# Patient Record
Sex: Male | Born: 1935 | Race: White | Hispanic: No | Marital: Married | Smoking: Never smoker
Health system: Southern US, Community
[De-identification: ages and names within clinical notes are randomized; demographics above are authoritative.]

## PROBLEM LIST (undated history)

## (undated) DIAGNOSIS — G4730 Sleep apnea, unspecified: Secondary | ICD-10-CM

## (undated) DIAGNOSIS — G4700 Insomnia, unspecified: Secondary | ICD-10-CM

## (undated) DIAGNOSIS — F0390 Unspecified dementia without behavioral disturbance: Secondary | ICD-10-CM

## (undated) DIAGNOSIS — I10 Essential (primary) hypertension: Secondary | ICD-10-CM

## (undated) DIAGNOSIS — Z95810 Presence of automatic (implantable) cardiac defibrillator: Secondary | ICD-10-CM

## (undated) DIAGNOSIS — F329 Major depressive disorder, single episode, unspecified: Secondary | ICD-10-CM

## (undated) DIAGNOSIS — E559 Vitamin D deficiency, unspecified: Secondary | ICD-10-CM

## (undated) DIAGNOSIS — E669 Obesity, unspecified: Secondary | ICD-10-CM

## (undated) DIAGNOSIS — F32A Depression, unspecified: Secondary | ICD-10-CM

## (undated) DIAGNOSIS — N390 Urinary tract infection, site not specified: Secondary | ICD-10-CM

## (undated) DIAGNOSIS — I447 Left bundle-branch block, unspecified: Secondary | ICD-10-CM

## (undated) DIAGNOSIS — I428 Other cardiomyopathies: Secondary | ICD-10-CM

## (undated) HISTORY — PX: TOTAL KNEE ARTHROPLASTY: SHX125

## (undated) HISTORY — PX: HERNIA REPAIR: SHX51

## (undated) HISTORY — DX: Other cardiomyopathies: I42.8

## (undated) HISTORY — PX: BACK SURGERY: SHX140

## (undated) HISTORY — PX: CARDIAC DEFIBRILLATOR PLACEMENT: SHX171

## (undated) HISTORY — DX: Left bundle-branch block, unspecified: I44.7

---

## 1999-08-02 ENCOUNTER — Encounter: Payer: Self-pay | Admitting: Emergency Medicine

## 1999-08-02 ENCOUNTER — Inpatient Hospital Stay (HOSPITAL_COMMUNITY): Admission: EM | Admit: 1999-08-02 | Discharge: 1999-08-03 | Payer: Self-pay | Admitting: Emergency Medicine

## 1999-08-03 ENCOUNTER — Encounter (HOSPITAL_BASED_OUTPATIENT_CLINIC_OR_DEPARTMENT_OTHER): Payer: Self-pay | Admitting: Internal Medicine

## 1999-08-03 ENCOUNTER — Inpatient Hospital Stay (HOSPITAL_COMMUNITY): Admission: AD | Admit: 1999-08-03 | Discharge: 1999-08-18 | Payer: Self-pay | Admitting: Psychiatry

## 2000-01-18 ENCOUNTER — Inpatient Hospital Stay (HOSPITAL_COMMUNITY): Admission: EM | Admit: 2000-01-18 | Discharge: 2000-01-23 | Payer: Self-pay | Admitting: *Deleted

## 2000-09-19 ENCOUNTER — Inpatient Hospital Stay (HOSPITAL_COMMUNITY): Admission: EM | Admit: 2000-09-19 | Discharge: 2000-09-25 | Payer: Self-pay | Admitting: Psychiatry

## 2001-03-01 ENCOUNTER — Inpatient Hospital Stay (HOSPITAL_COMMUNITY): Admission: EM | Admit: 2001-03-01 | Discharge: 2001-03-05 | Payer: Self-pay | Admitting: *Deleted

## 2001-04-16 ENCOUNTER — Inpatient Hospital Stay (HOSPITAL_COMMUNITY): Admission: EM | Admit: 2001-04-16 | Discharge: 2001-04-21 | Payer: Self-pay | Admitting: *Deleted

## 2001-05-21 ENCOUNTER — Inpatient Hospital Stay (HOSPITAL_COMMUNITY): Admission: EM | Admit: 2001-05-21 | Discharge: 2001-05-29 | Payer: Self-pay | Admitting: *Deleted

## 2001-08-07 ENCOUNTER — Inpatient Hospital Stay (HOSPITAL_COMMUNITY): Admission: EM | Admit: 2001-08-07 | Discharge: 2001-08-18 | Payer: Self-pay | Admitting: Psychiatry

## 2002-08-07 ENCOUNTER — Encounter: Payer: Self-pay | Admitting: Internal Medicine

## 2002-08-07 ENCOUNTER — Encounter: Admission: RE | Admit: 2002-08-07 | Discharge: 2002-08-07 | Payer: Self-pay | Admitting: Internal Medicine

## 2002-08-28 ENCOUNTER — Encounter: Payer: Self-pay | Admitting: Orthopedic Surgery

## 2002-08-31 ENCOUNTER — Encounter: Payer: Self-pay | Admitting: Orthopedic Surgery

## 2002-08-31 ENCOUNTER — Inpatient Hospital Stay (HOSPITAL_COMMUNITY): Admission: RE | Admit: 2002-08-31 | Discharge: 2002-09-09 | Payer: Self-pay | Admitting: Orthopedic Surgery

## 2002-09-02 ENCOUNTER — Encounter: Payer: Self-pay | Admitting: Orthopedic Surgery

## 2003-05-18 ENCOUNTER — Encounter: Payer: Self-pay | Admitting: Neurosurgery

## 2003-05-18 ENCOUNTER — Inpatient Hospital Stay (HOSPITAL_COMMUNITY): Admission: RE | Admit: 2003-05-18 | Discharge: 2003-05-26 | Payer: Self-pay | Admitting: Neurosurgery

## 2004-01-11 ENCOUNTER — Encounter: Admission: RE | Admit: 2004-01-11 | Discharge: 2004-01-11 | Payer: Self-pay | Admitting: Urology

## 2004-01-12 ENCOUNTER — Ambulatory Visit (HOSPITAL_COMMUNITY): Admission: RE | Admit: 2004-01-12 | Discharge: 2004-01-12 | Payer: Self-pay | Admitting: Urology

## 2004-01-12 ENCOUNTER — Ambulatory Visit (HOSPITAL_BASED_OUTPATIENT_CLINIC_OR_DEPARTMENT_OTHER): Admission: RE | Admit: 2004-01-12 | Discharge: 2004-01-12 | Payer: Self-pay | Admitting: Urology

## 2004-03-07 ENCOUNTER — Encounter: Admission: RE | Admit: 2004-03-07 | Discharge: 2004-03-07 | Payer: Self-pay | Admitting: Family Medicine

## 2004-04-04 ENCOUNTER — Ambulatory Visit (HOSPITAL_COMMUNITY): Admission: RE | Admit: 2004-04-04 | Discharge: 2004-04-04 | Payer: Self-pay | Admitting: Cardiology

## 2004-04-12 ENCOUNTER — Encounter: Admission: RE | Admit: 2004-04-12 | Discharge: 2004-04-12 | Payer: Self-pay | Admitting: Orthopedic Surgery

## 2004-04-13 ENCOUNTER — Ambulatory Visit (HOSPITAL_COMMUNITY): Admission: RE | Admit: 2004-04-13 | Discharge: 2004-04-13 | Payer: Self-pay | Admitting: Orthopedic Surgery

## 2004-04-13 ENCOUNTER — Ambulatory Visit (HOSPITAL_BASED_OUTPATIENT_CLINIC_OR_DEPARTMENT_OTHER): Admission: RE | Admit: 2004-04-13 | Discharge: 2004-04-13 | Payer: Self-pay | Admitting: Orthopedic Surgery

## 2004-05-22 IMAGING — NM NM MYOCAR PERF WALL MOTION
3 series · 13 of 13 positions shown · non-contrast
Comparison: none

CLINICAL DATA: Chest pain.  Hypertension. Preop.
 NUCLEAR MEDICINE MYOCARDIAL PERFUSION IMAGING WITH SPECT

[Series 1: sc stress gated cardio · 1 of 1 slices shown (1 of 2)]
[im 1/1]
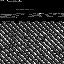

[Series 1: rc rest cardiolite · 6.85mm/px · 6 of 64 frames shown]
[frame 6/64]
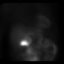
[frame 16/64]
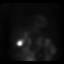
[frame 27/64]
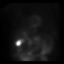
[frame 38/64]
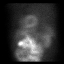
[frame 48/64]
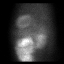
[frame 59/64]
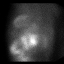

[Series 1: sc stress gated cardio · 6.85mm/px · 6 of 512 frames shown (2 of 2)]
[frame 43/512]
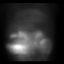
[frame 128/512]
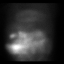
[frame 214/512]
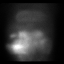
[frame 299/512]
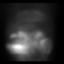
[frame 384/512]
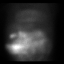
[frame 470/512]
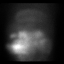

[13 of 13 positions shown; findings below may reference images not displayed]

FINDINGS: After the intravenous injection of 10 and 30 millicuries technetium 99m sestamibi, myocardial perfusion rest and stress imaging was obtained.  Persantine was utilized as the stress agent.
 MYOCARDIAL PERFUSION IMAGING WITH SPECT
 Fixed thinning of the inferolateral wall towards the base and mild fixed thinning of the anteroseptal region towards the apex is noted. A very small perfusion defect on the stress images re-perfuses normally on the rest images worrisome for a very small area of stress-induced ischemia.
 WALL MOTION:  
 Mild global hypokinesis is present. 
 EJECTION FRACTION
 Calculated ejection fraction is 43% with an end diastolic volume of 163 ml and an end systolic volume of 92 ml.
 IMPRESSION
 A tiny area of stress-induced ischemia at the apex.
 Ejection fraction is 43% with mild global hypokinesis.

## 2004-05-23 ENCOUNTER — Ambulatory Visit (HOSPITAL_COMMUNITY): Admission: RE | Admit: 2004-05-23 | Discharge: 2004-05-23 | Payer: Self-pay | Admitting: *Deleted

## 2004-05-23 ENCOUNTER — Encounter (INDEPENDENT_AMBULATORY_CARE_PROVIDER_SITE_OTHER): Payer: Self-pay | Admitting: *Deleted

## 2004-05-24 ENCOUNTER — Ambulatory Visit (HOSPITAL_COMMUNITY): Admission: RE | Admit: 2004-05-24 | Discharge: 2004-05-24 | Payer: Self-pay | Admitting: *Deleted

## 2004-06-09 ENCOUNTER — Ambulatory Visit (HOSPITAL_COMMUNITY): Admission: RE | Admit: 2004-06-09 | Discharge: 2004-06-09 | Payer: Self-pay | Admitting: *Deleted

## 2004-07-07 ENCOUNTER — Observation Stay (HOSPITAL_COMMUNITY): Admission: RE | Admit: 2004-07-07 | Discharge: 2004-07-08 | Payer: Self-pay | Admitting: General Surgery

## 2005-01-09 ENCOUNTER — Ambulatory Visit (HOSPITAL_COMMUNITY): Admission: RE | Admit: 2005-01-09 | Discharge: 2005-01-10 | Payer: Self-pay | Admitting: General Surgery

## 2005-06-14 ENCOUNTER — Ambulatory Visit (HOSPITAL_COMMUNITY): Admission: RE | Admit: 2005-06-14 | Discharge: 2005-06-14 | Payer: Self-pay | Admitting: Internal Medicine

## 2005-08-15 ENCOUNTER — Emergency Department (HOSPITAL_COMMUNITY): Admission: EM | Admit: 2005-08-15 | Discharge: 2005-08-15 | Payer: Self-pay | Admitting: Emergency Medicine

## 2006-04-12 ENCOUNTER — Emergency Department (HOSPITAL_COMMUNITY): Admission: EM | Admit: 2006-04-12 | Discharge: 2006-04-12 | Payer: Self-pay | Admitting: Family Medicine

## 2006-04-13 ENCOUNTER — Emergency Department (HOSPITAL_COMMUNITY): Admission: EM | Admit: 2006-04-13 | Discharge: 2006-04-13 | Payer: Self-pay | Admitting: Emergency Medicine

## 2006-04-17 ENCOUNTER — Inpatient Hospital Stay (HOSPITAL_COMMUNITY): Admission: EM | Admit: 2006-04-17 | Discharge: 2006-04-19 | Payer: Self-pay | Admitting: Emergency Medicine

## 2006-04-18 ENCOUNTER — Encounter (INDEPENDENT_AMBULATORY_CARE_PROVIDER_SITE_OTHER): Payer: Self-pay | Admitting: *Deleted

## 2006-05-01 ENCOUNTER — Emergency Department (HOSPITAL_COMMUNITY): Admission: EM | Admit: 2006-05-01 | Discharge: 2006-05-01 | Payer: Self-pay | Admitting: Podiatry

## 2006-05-13 ENCOUNTER — Encounter: Admission: RE | Admit: 2006-05-13 | Discharge: 2006-05-13 | Payer: Self-pay | Admitting: Cardiovascular Disease

## 2006-05-17 ENCOUNTER — Inpatient Hospital Stay (HOSPITAL_COMMUNITY): Admission: RE | Admit: 2006-05-17 | Discharge: 2006-05-19 | Payer: Self-pay | Admitting: Cardiovascular Disease

## 2006-08-28 ENCOUNTER — Ambulatory Visit: Payer: Self-pay | Admitting: Pulmonary Disease

## 2006-08-28 ENCOUNTER — Inpatient Hospital Stay (HOSPITAL_COMMUNITY): Admission: EM | Admit: 2006-08-28 | Discharge: 2006-09-04 | Payer: Self-pay | Admitting: Emergency Medicine

## 2007-06-03 ENCOUNTER — Inpatient Hospital Stay (HOSPITAL_COMMUNITY): Admission: EM | Admit: 2007-06-03 | Discharge: 2007-06-10 | Payer: Self-pay | Admitting: Emergency Medicine

## 2007-06-09 ENCOUNTER — Encounter (INDEPENDENT_AMBULATORY_CARE_PROVIDER_SITE_OTHER): Payer: Self-pay | Admitting: *Deleted

## 2007-06-10 ENCOUNTER — Encounter (INDEPENDENT_AMBULATORY_CARE_PROVIDER_SITE_OTHER): Payer: Self-pay | Admitting: *Deleted

## 2007-12-04 ENCOUNTER — Ambulatory Visit (HOSPITAL_COMMUNITY): Admission: RE | Admit: 2007-12-04 | Discharge: 2007-12-04 | Payer: Self-pay | Admitting: *Deleted

## 2007-12-04 ENCOUNTER — Encounter (INDEPENDENT_AMBULATORY_CARE_PROVIDER_SITE_OTHER): Payer: Self-pay | Admitting: *Deleted

## 2008-09-04 ENCOUNTER — Inpatient Hospital Stay (HOSPITAL_COMMUNITY): Admission: EM | Admit: 2008-09-04 | Discharge: 2008-09-07 | Payer: Self-pay | Admitting: Emergency Medicine

## 2008-09-08 ENCOUNTER — Emergency Department (HOSPITAL_COMMUNITY): Admission: EM | Admit: 2008-09-08 | Discharge: 2008-09-08 | Payer: Self-pay | Admitting: Emergency Medicine

## 2009-03-16 ENCOUNTER — Inpatient Hospital Stay (HOSPITAL_COMMUNITY): Admission: EM | Admit: 2009-03-16 | Discharge: 2009-03-21 | Payer: Self-pay | Admitting: Emergency Medicine

## 2009-05-25 ENCOUNTER — Inpatient Hospital Stay (HOSPITAL_COMMUNITY): Admission: EM | Admit: 2009-05-25 | Discharge: 2009-05-28 | Payer: Self-pay | Admitting: Emergency Medicine

## 2009-09-02 ENCOUNTER — Ambulatory Visit: Payer: Self-pay | Admitting: Diagnostic Radiology

## 2009-09-02 ENCOUNTER — Emergency Department (HOSPITAL_BASED_OUTPATIENT_CLINIC_OR_DEPARTMENT_OTHER): Admission: EM | Admit: 2009-09-02 | Discharge: 2009-09-02 | Payer: Self-pay | Admitting: Emergency Medicine

## 2009-10-06 ENCOUNTER — Emergency Department (HOSPITAL_COMMUNITY): Admission: EM | Admit: 2009-10-06 | Discharge: 2009-10-06 | Payer: Self-pay | Admitting: Emergency Medicine

## 2009-10-17 ENCOUNTER — Inpatient Hospital Stay (HOSPITAL_COMMUNITY): Admission: EM | Admit: 2009-10-17 | Discharge: 2009-10-28 | Payer: Self-pay | Admitting: Emergency Medicine

## 2010-08-11 ENCOUNTER — Inpatient Hospital Stay (HOSPITAL_COMMUNITY): Admission: EM | Admit: 2010-08-11 | Discharge: 2010-08-15 | Payer: Self-pay | Admitting: Emergency Medicine

## 2010-08-12 ENCOUNTER — Encounter (INDEPENDENT_AMBULATORY_CARE_PROVIDER_SITE_OTHER): Payer: Self-pay | Admitting: Emergency Medicine

## 2010-08-12 ENCOUNTER — Ambulatory Visit: Payer: Self-pay | Admitting: Surgery

## 2010-08-13 ENCOUNTER — Encounter (INDEPENDENT_AMBULATORY_CARE_PROVIDER_SITE_OTHER): Payer: Self-pay | Admitting: Emergency Medicine

## 2010-08-16 ENCOUNTER — Encounter (INDEPENDENT_AMBULATORY_CARE_PROVIDER_SITE_OTHER): Payer: Self-pay | Admitting: *Deleted

## 2010-09-26 ENCOUNTER — Inpatient Hospital Stay (HOSPITAL_COMMUNITY): Admission: EM | Admit: 2010-09-26 | Discharge: 2010-10-06 | Payer: Self-pay | Admitting: Emergency Medicine

## 2010-09-26 ENCOUNTER — Ambulatory Visit: Payer: Self-pay | Admitting: Internal Medicine

## 2010-10-06 ENCOUNTER — Encounter: Payer: Self-pay | Admitting: Internal Medicine

## 2010-10-11 ENCOUNTER — Ambulatory Visit: Payer: Self-pay | Admitting: Internal Medicine

## 2010-10-11 ENCOUNTER — Ambulatory Visit (HOSPITAL_COMMUNITY)
Admission: RE | Admit: 2010-10-11 | Discharge: 2010-10-11 | Payer: Self-pay | Source: Home / Self Care | Admitting: Internal Medicine

## 2010-10-19 ENCOUNTER — Encounter (INDEPENDENT_AMBULATORY_CARE_PROVIDER_SITE_OTHER): Payer: Self-pay | Admitting: *Deleted

## 2010-10-27 ENCOUNTER — Encounter: Payer: Self-pay | Admitting: Internal Medicine

## 2010-11-11 ENCOUNTER — Ambulatory Visit: Payer: Self-pay | Admitting: Psychiatry

## 2010-11-13 IMAGING — CR DG CHEST 2V
1 series · 1 of 1 positions shown · non-contrast
Comparison: 08/11/2010

CLINICAL DATA: Weakness

CHEST - 2 VIEW

[view not recorded]
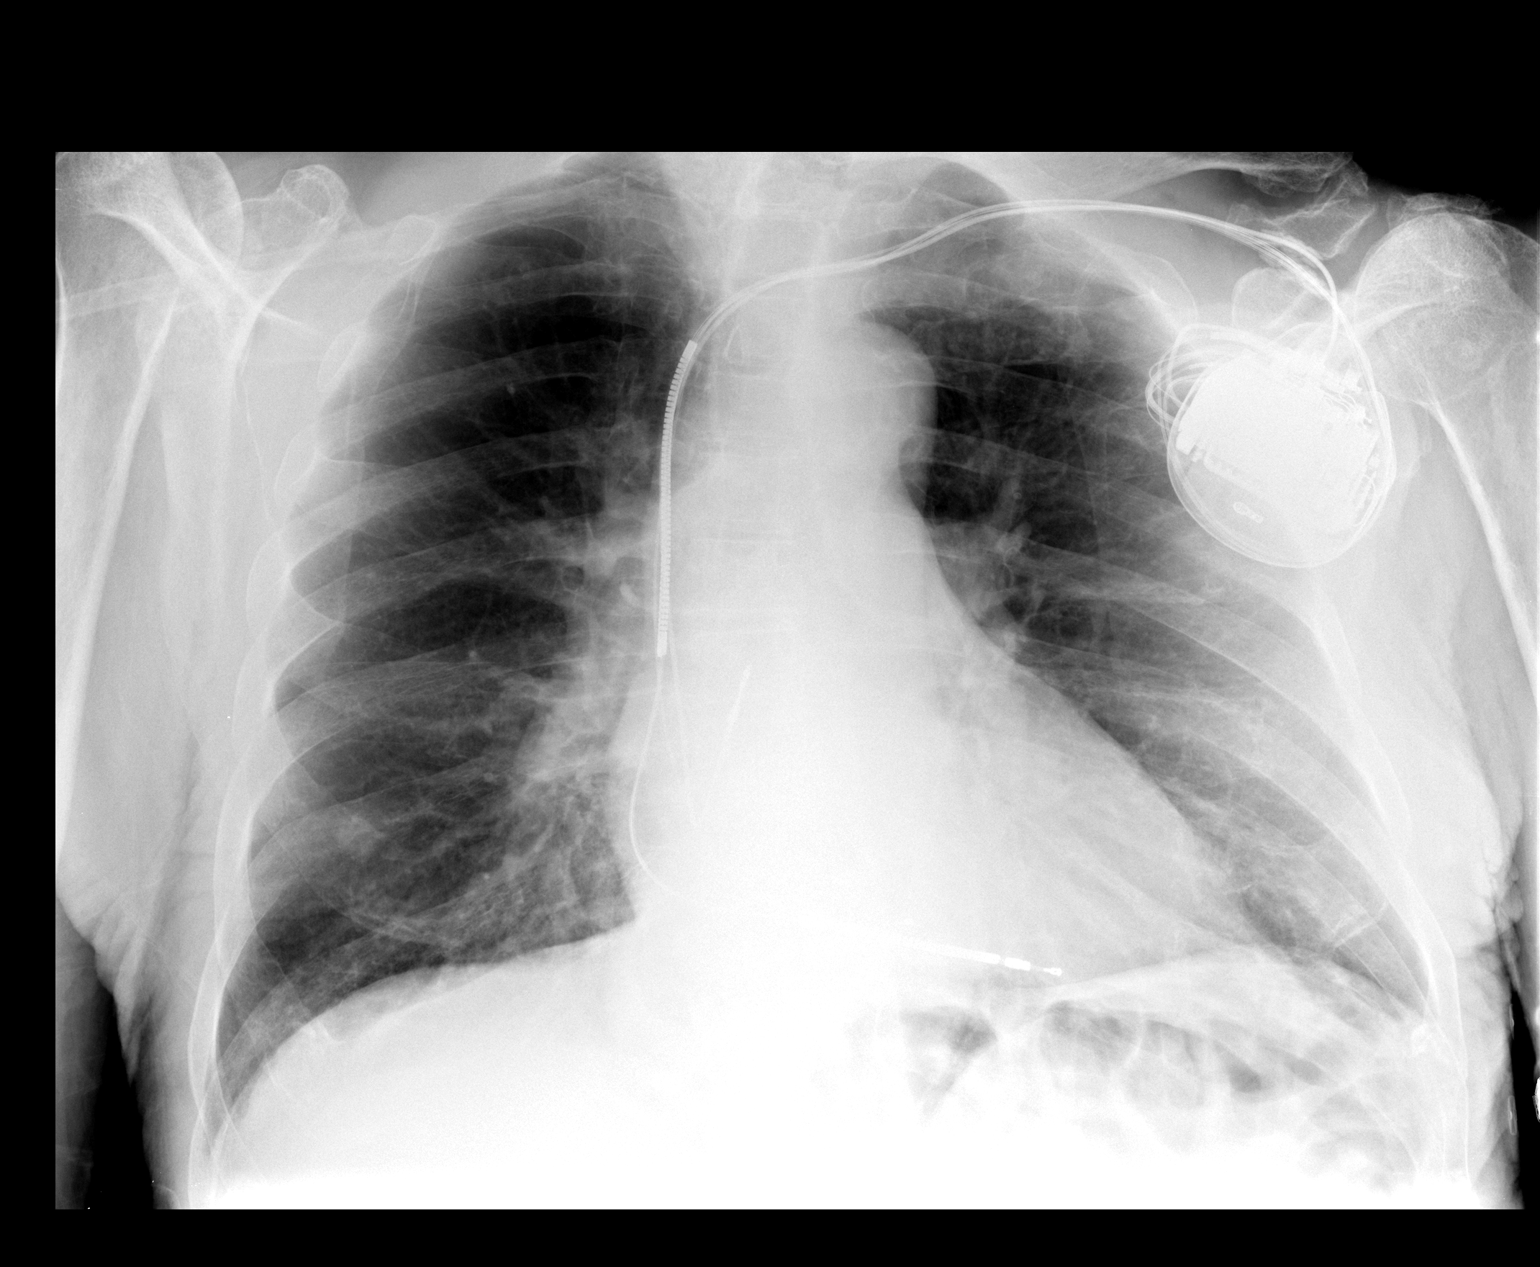

[1 of 1 positions shown; findings below may reference images not displayed]

FINDINGS: There is fall and hemidiaphragms compatible with
hyperexpansion.  There is a nodular opacity projecting over the
right mid to lower lung which is likely prominent nipple shadow.
There is increased opacity seen in the medial aspect of the right
lower lobe on both the AP and lateral view.  The heart is within
normal limits in size.  Left chest wall AICD is in place.  The
upper abdomen is normal.  Degenerative changes are seen in the
spine and there are post-traumatic changes seen in the distal left
clavicle and coracoclavicular ligament.
IMPRESSION: 1.  Findings suggestive of right lower lobe pneumonia on a
background of emphysema.  Incidental findings as above.

## 2010-12-13 ENCOUNTER — Ambulatory Visit: Payer: Self-pay | Admitting: Infectious Disease

## 2010-12-26 ENCOUNTER — Inpatient Hospital Stay (HOSPITAL_COMMUNITY)
Admission: EM | Admit: 2010-12-26 | Discharge: 2011-01-05 | Disposition: A | Discharge status: Other Acute Inpatient Hospital | Payer: Self-pay | Source: Home / Self Care | Attending: Internal Medicine | Admitting: Internal Medicine

## 2010-12-27 LAB — CBC
HCT: 32.8 % — ABNORMAL LOW (ref 39.0–52.0)
Hemoglobin: 11 g/dL — ABNORMAL LOW (ref 13.0–17.0)
MCH: 30.8 pg (ref 26.0–34.0)
MCHC: 33.5 g/dL (ref 30.0–36.0)
MCV: 91.9 fL (ref 78.0–100.0)
Platelets: 181 10*3/uL (ref 150–400)
RBC: 3.57 MIL/uL — ABNORMAL LOW (ref 4.22–5.81)
RDW: 15.9 % — ABNORMAL HIGH (ref 11.5–15.5)
WBC: 6.5 10*3/uL (ref 4.0–10.5)

## 2010-12-27 LAB — COMPREHENSIVE METABOLIC PANEL
ALT: 16 U/L (ref 0–53)
ALT: 15 U/L (ref 0–53)
AST: 20 U/L (ref 0–37)
AST: 20 U/L (ref 0–37)
Albumin: 1.9 g/dL — ABNORMAL LOW (ref 3.5–5.2)
Albumin: 1.9 g/dL — ABNORMAL LOW (ref 3.5–5.2)
Alkaline Phosphatase: 44 U/L (ref 39–117)
Alkaline Phosphatase: 44 U/L (ref 39–117)
BUN: 22 mg/dL (ref 6–23)
BUN: 20 mg/dL (ref 6–23)
CO2: 22 mEq/L (ref 19–32)
CO2: 20 mEq/L (ref 19–32)
Calcium: 8.2 mg/dL — ABNORMAL LOW (ref 8.4–10.5)
Calcium: 7.8 mg/dL — ABNORMAL LOW (ref 8.4–10.5)
Chloride: 116 mEq/L — ABNORMAL HIGH (ref 96–112)
Chloride: 113 mEq/L — ABNORMAL HIGH (ref 96–112)
Creatinine, Ser: 1.2 mg/dL (ref 0.4–1.5)
Creatinine, Ser: 0.93 mg/dL (ref 0.4–1.5)
GFR calc Af Amer: >60 mL/min (ref >60)
GFR calc Af Amer: >60 mL/min (ref >60)
GFR calc non Af Amer: 58 mL/min — ABNORMAL LOW (ref >60)
GFR calc non Af Amer: >60 mL/min (ref >60)
Glucose, Bld: 112 mg/dL — ABNORMAL HIGH (ref 70–99)
Glucose, Bld: 96 mg/dL (ref 70–99)
Potassium: 3 mEq/L — ABNORMAL LOW (ref 3.5–5.1)
Potassium: 3.6 mEq/L (ref 3.5–5.1)
Sodium: 142 mEq/L (ref 135–145)
Sodium: 136 mEq/L (ref 135–145)
Total Bilirubin: 0.4 mg/dL (ref 0.3–1.2)
Total Bilirubin: 0.3 mg/dL (ref 0.3–1.2)
Total Protein: 4.8 g/dL — ABNORMAL LOW (ref 6.0–8.3)
Total Protein: 4.6 g/dL — ABNORMAL LOW (ref 6.0–8.3)

## 2010-12-27 LAB — PHOSPHORUS: Phosphorus: 1.7 mg/dL — ABNORMAL LOW (ref 2.3–4.6)

## 2010-12-27 LAB — APTT: aPTT: 37 seconds (ref 24–37)

## 2010-12-27 LAB — CARDIAC PANEL(CRET KIN+CKTOT+MB+TROPI)
CK, MB: 3.3 ng/mL (ref 0.3–4.0)
CK, MB: 4 ng/mL (ref 0.3–4.0)
CK, MB: 3.5 ng/mL (ref 0.3–4.0)
Relative Index: INVALID (ref 0.0–2.5)
Relative Index: INVALID (ref 0.0–2.5)
Relative Index: INVALID (ref 0.0–2.5)
Total CK: 30 U/L (ref 7–232)
Total CK: 29 U/L (ref 7–232)
Total CK: 23 U/L (ref 7–232)
Troponin I: 0.04 ng/mL (ref 0.00–0.06)
Troponin I: 0.03 ng/mL (ref 0.00–0.06)
Troponin I: 0.04 ng/mL (ref 0.00–0.06)

## 2010-12-27 LAB — LACTATE DEHYDROGENASE: LDH: 108 U/L (ref 94–250)

## 2010-12-27 LAB — GLUCOSE, CAPILLARY
Glucose-Capillary: 128 mg/dL — ABNORMAL HIGH (ref 70–99)
Glucose-Capillary: 90 mg/dL (ref 70–99)
Glucose-Capillary: 85 mg/dL (ref 70–99)

## 2010-12-27 LAB — CARBOXYHEMOGLOBIN
Carboxyhemoglobin: 1 % (ref 0.5–1.5)
Carboxyhemoglobin: 0.9 % (ref 0.5–1.5)
Methemoglobin: 0.7 % (ref 0.0–1.5)
Methemoglobin: 0.7 % (ref 0.0–1.5)
O2 Saturation: 61.9 %
O2 Saturation: 65.3 %
Total hemoglobin: 10.3 g/dL — ABNORMAL LOW (ref 13.5–18.0)
Total hemoglobin: 10.4 g/dL — ABNORMAL LOW (ref 13.5–18.0)

## 2010-12-27 LAB — TYPE AND SCREEN
ABO/RH(D): A NEG
Antibody Screen: NEGATIVE

## 2010-12-27 LAB — MAGNESIUM: Magnesium: 1.5 mg/dL (ref 1.5–2.5)

## 2010-12-27 LAB — MRSA PCR SCREENING: MRSA by PCR: POSITIVE — AB

## 2010-12-27 LAB — D-DIMER, QUANTITATIVE: D-Dimer, Quant: 8.02 ug/mL-FEU — ABNORMAL HIGH (ref 0.00–0.48)

## 2010-12-27 LAB — PROTIME-INR
INR: 1.25 (ref 0.00–1.49)
Prothrombin Time: 15.9 seconds — ABNORMAL HIGH (ref 11.6–15.2)

## 2010-12-27 LAB — ABO/RH: ABO/RH(D): A NEG

## 2010-12-27 LAB — LACTIC ACID, PLASMA: Lactic Acid, Venous: 1.6 mmol/L (ref 0.5–2.2)

## 2010-12-28 LAB — DIFFERENTIAL
Basophils Absolute: 0 10*3/uL (ref 0.0–0.1)
Basophils Relative: 0 % (ref 0–1)
Eosinophils Absolute: 0 10*3/uL (ref 0.0–0.7)
Eosinophils Relative: 0 % (ref 0–5)
Lymphocytes Relative: 14 % (ref 12–46)
Lymphs Abs: 0.6 10*3/uL — ABNORMAL LOW (ref 0.7–4.0)
Monocytes Absolute: 0.3 10*3/uL (ref 0.1–1.0)
Monocytes Relative: 7 % (ref 3–12)
Neutro Abs: 3.2 10*3/uL (ref 1.7–7.7)
Neutrophils Relative %: 79 % — ABNORMAL HIGH (ref 43–77)

## 2010-12-28 LAB — PROTIME-INR
INR: 1.18 (ref 0.00–1.49)
Prothrombin Time: 15.2 seconds (ref 11.6–15.2)

## 2010-12-28 LAB — APTT: aPTT: 38 seconds — ABNORMAL HIGH (ref 24–37)

## 2010-12-28 LAB — BASIC METABOLIC PANEL
BUN: 14 mg/dL (ref 6–23)
CO2: 21 mEq/L (ref 19–32)
Calcium: 8.1 mg/dL — ABNORMAL LOW (ref 8.4–10.5)
Chloride: 114 mEq/L — ABNORMAL HIGH (ref 96–112)
Creatinine, Ser: 0.86 mg/dL (ref 0.4–1.5)
GFR calc Af Amer: >60 mL/min (ref >60)
GFR calc non Af Amer: >60 mL/min (ref >60)
Glucose, Bld: 127 mg/dL — ABNORMAL HIGH (ref 70–99)
Potassium: 3.3 mEq/L — ABNORMAL LOW (ref 3.5–5.1)
Sodium: 139 mEq/L (ref 135–145)

## 2010-12-28 LAB — MAGNESIUM: Magnesium: 1.9 mg/dL (ref 1.5–2.5)

## 2010-12-28 LAB — CBC
HCT: 29.8 % — ABNORMAL LOW (ref 39.0–52.0)
Hemoglobin: 10.2 g/dL — ABNORMAL LOW (ref 13.0–17.0)
MCH: 31.1 pg (ref 26.0–34.0)
MCHC: 34.2 g/dL (ref 30.0–36.0)
MCV: 90.9 fL (ref 78.0–100.0)
Platelets: 139 10*3/uL — ABNORMAL LOW (ref 150–400)
RBC: 3.28 MIL/uL — ABNORMAL LOW (ref 4.22–5.81)
RDW: 15.6 % — ABNORMAL HIGH (ref 11.5–15.5)
WBC: 4.1 10*3/uL (ref 4.0–10.5)

## 2010-12-28 LAB — CORTISOL: Cortisol, Plasma: 21.9 ug/dL

## 2010-12-28 LAB — GLUCOSE, CAPILLARY
Glucose-Capillary: 110 mg/dL — ABNORMAL HIGH (ref 70–99)
Glucose-Capillary: 115 mg/dL — ABNORMAL HIGH (ref 70–99)
Glucose-Capillary: 122 mg/dL — ABNORMAL HIGH (ref 70–99)
Glucose-Capillary: 144 mg/dL — ABNORMAL HIGH (ref 70–99)

## 2010-12-28 LAB — PHOSPHORUS: Phosphorus: 3 mg/dL (ref 2.3–4.6)

## 2010-12-29 LAB — CBC
HCT: 36.3 % — ABNORMAL LOW (ref 39.0–52.0)
Hemoglobin: 12.5 g/dL — ABNORMAL LOW (ref 13.0–17.0)
MCH: 31.1 pg (ref 26.0–34.0)
MCHC: 34.4 g/dL (ref 30.0–36.0)
MCV: 90.3 fL (ref 78.0–100.0)
Platelets: 195 10*3/uL (ref 150–400)
RBC: 4.02 MIL/uL — ABNORMAL LOW (ref 4.22–5.81)
RDW: 15.4 % (ref 11.5–15.5)
WBC: 7.8 10*3/uL (ref 4.0–10.5)

## 2010-12-29 LAB — BASIC METABOLIC PANEL
BUN: 15 mg/dL (ref 6–23)
CO2: 19 mEq/L (ref 19–32)
Calcium: 9.3 mg/dL (ref 8.4–10.5)
Chloride: 110 mEq/L (ref 96–112)
Creatinine, Ser: 1.06 mg/dL (ref 0.4–1.5)
GFR calc Af Amer: >60 mL/min (ref >60)
GFR calc non Af Amer: >60 mL/min (ref >60)
Glucose, Bld: 141 mg/dL — ABNORMAL HIGH (ref 70–99)
Potassium: 5.1 mEq/L (ref 3.5–5.1)
Sodium: 138 mEq/L (ref 135–145)

## 2011-01-01 ENCOUNTER — Encounter (INDEPENDENT_AMBULATORY_CARE_PROVIDER_SITE_OTHER): Payer: Self-pay | Admitting: Internal Medicine

## 2011-01-03 ENCOUNTER — Ambulatory Visit: Admit: 2011-01-03 | Payer: Self-pay | Admitting: Infectious Diseases

## 2011-01-08 LAB — CULTURE, BLOOD (ROUTINE X 2)
Culture  Setup Time: 201201042127
Culture  Setup Time: 201201042128
Culture  Setup Time: 201201092212
Culture  Setup Time: 201201092213
Culture: NO GROWTH
Culture: NO GROWTH

## 2011-01-08 LAB — BASIC METABOLIC PANEL
BUN: 17 mg/dL (ref 6–23)
BUN: 12 mg/dL (ref 6–23)
BUN: 10 mg/dL (ref 6–23)
BUN: 8 mg/dL (ref 6–23)
CO2: 19 mEq/L (ref 19–32)
CO2: 21 mEq/L (ref 19–32)
CO2: 22 mEq/L (ref 19–32)
CO2: 23 mEq/L (ref 19–32)
Calcium: 9 mg/dL (ref 8.4–10.5)
Calcium: 8.5 mg/dL (ref 8.4–10.5)
Calcium: 8.2 mg/dL — ABNORMAL LOW (ref 8.4–10.5)
Calcium: 8.4 mg/dL (ref 8.4–10.5)
Chloride: 112 mEq/L (ref 96–112)
Chloride: 112 mEq/L (ref 96–112)
Chloride: 108 mEq/L (ref 96–112)
Chloride: 109 mEq/L (ref 96–112)
Creatinine, Ser: 0.88 mg/dL (ref 0.4–1.5)
Creatinine, Ser: 0.85 mg/dL (ref 0.4–1.5)
Creatinine, Ser: 0.85 mg/dL (ref 0.4–1.5)
Creatinine, Ser: 0.97 mg/dL (ref 0.4–1.5)
GFR calc Af Amer: >60 mL/min (ref >60)
GFR calc Af Amer: >60 mL/min (ref >60)
GFR calc Af Amer: >60 mL/min (ref >60)
GFR calc Af Amer: >60 mL/min (ref >60)
GFR calc non Af Amer: >60 mL/min (ref >60)
GFR calc non Af Amer: >60 mL/min (ref >60)
GFR calc non Af Amer: >60 mL/min (ref >60)
GFR calc non Af Amer: >60 mL/min (ref >60)
Glucose, Bld: 80 mg/dL (ref 70–99)
Glucose, Bld: 92 mg/dL (ref 70–99)
Glucose, Bld: 95 mg/dL (ref 70–99)
Glucose, Bld: 90 mg/dL (ref 70–99)
Potassium: 3.2 mEq/L — ABNORMAL LOW (ref 3.5–5.1)
Potassium: 3.4 mEq/L — ABNORMAL LOW (ref 3.5–5.1)
Potassium: 3.3 mEq/L — ABNORMAL LOW (ref 3.5–5.1)
Potassium: 4 mEq/L (ref 3.5–5.1)
Sodium: 140 mEq/L (ref 135–145)
Sodium: 137 mEq/L (ref 135–145)
Sodium: 134 mEq/L — ABNORMAL LOW (ref 135–145)
Sodium: 135 mEq/L (ref 135–145)

## 2011-01-08 LAB — GLUCOSE, CAPILLARY
Glucose-Capillary: 165 mg/dL — ABNORMAL HIGH (ref 70–99)
Glucose-Capillary: 94 mg/dL (ref 70–99)
Glucose-Capillary: 96 mg/dL (ref 70–99)
Glucose-Capillary: 96 mg/dL (ref 70–99)
Glucose-Capillary: 123 mg/dL — ABNORMAL HIGH (ref 70–99)
Glucose-Capillary: 95 mg/dL (ref 70–99)
Glucose-Capillary: 83 mg/dL (ref 70–99)
Glucose-Capillary: 115 mg/dL — ABNORMAL HIGH (ref 70–99)
Glucose-Capillary: 106 mg/dL — ABNORMAL HIGH (ref 70–99)
Glucose-Capillary: 96 mg/dL (ref 70–99)
Glucose-Capillary: 113 mg/dL — ABNORMAL HIGH (ref 70–99)
Glucose-Capillary: 103 mg/dL — ABNORMAL HIGH (ref 70–99)
Glucose-Capillary: 121 mg/dL — ABNORMAL HIGH (ref 70–99)
Glucose-Capillary: 97 mg/dL (ref 70–99)
Glucose-Capillary: 114 mg/dL — ABNORMAL HIGH (ref 70–99)
Glucose-Capillary: 111 mg/dL — ABNORMAL HIGH (ref 70–99)
Glucose-Capillary: 104 mg/dL — ABNORMAL HIGH (ref 70–99)
Glucose-Capillary: 91 mg/dL (ref 70–99)
Glucose-Capillary: 108 mg/dL — ABNORMAL HIGH (ref 70–99)
Glucose-Capillary: 85 mg/dL (ref 70–99)
Glucose-Capillary: 82 mg/dL (ref 70–99)
Glucose-Capillary: 94 mg/dL (ref 70–99)
Glucose-Capillary: 99 mg/dL (ref 70–99)

## 2011-01-08 LAB — MAGNESIUM
Magnesium: 2.2 mg/dL (ref 1.5–2.5)
Magnesium: 1.7 mg/dL (ref 1.5–2.5)

## 2011-01-08 LAB — CBC
HCT: 34.8 % — ABNORMAL LOW (ref 39.0–52.0)
HCT: 30.4 % — ABNORMAL LOW (ref 39.0–52.0)
Hemoglobin: 11.9 g/dL — ABNORMAL LOW (ref 13.0–17.0)
Hemoglobin: 10.4 g/dL — ABNORMAL LOW (ref 13.0–17.0)
MCH: 30.7 pg (ref 26.0–34.0)
MCH: 30.9 pg (ref 26.0–34.0)
MCHC: 34.2 g/dL (ref 30.0–36.0)
MCHC: 34.2 g/dL (ref 30.0–36.0)
MCV: 89.9 fL (ref 78.0–100.0)
MCV: 90.2 fL (ref 78.0–100.0)
Platelets: 181 10*3/uL (ref 150–400)
Platelets: 351 10*3/uL (ref 150–400)
RBC: 3.87 MIL/uL — ABNORMAL LOW (ref 4.22–5.81)
RBC: 3.37 MIL/uL — ABNORMAL LOW (ref 4.22–5.81)
RDW: 15.6 % — ABNORMAL HIGH (ref 11.5–15.5)
RDW: 16.2 % — ABNORMAL HIGH (ref 11.5–15.5)
WBC: 7.5 10*3/uL (ref 4.0–10.5)
WBC: 9.6 10*3/uL (ref 4.0–10.5)

## 2011-01-14 ENCOUNTER — Encounter: Payer: Self-pay | Admitting: Family Medicine

## 2011-01-23 NOTE — Letter (Signed)
Summary: New Patient letter  [facility]  [address] [place], [place] [address]   Phone: [phone]  Fax: [phone]       08/16/2010 MRN: [id number]  Kevin [name] [address] [place], [place]  [address]  Dear Kevin [name],  Welcome to the Gastroenterology Division at [facility].    You are scheduled to see Dr. Arlyce [name] on 10/03/2010 at 9:00AM on the 3rd floor at [facility], [address].  We ask that you try to arrive at our office 15 minutes prior to your appointment time to allow for check-in.  We would like you to complete the enclosed self-administered evaluation form prior to your visit and bring it with you on the day of your appointment.  We will review it with you.  Also, please bring a complete list of all your medications or, if you prefer, bring the medication bottles and we will list them.  Please bring your insurance card so that we may make a copy of it.  If your insurance requires a referral to see a specialist, please bring your referral form from your primary care physician.  Co-payments are due at the time of your visit and may be paid by cash, check or credit card.     Your office visit will consist of a consult with your physician (includes a physical exam), any laboratory testing he/she may order, scheduling of any necessary diagnostic testing (e.g. x-ray, ultrasound, CT-scan), and scheduling of a procedure (e.g. Endoscopy, Colonoscopy) if required.  Please allow enough time on your schedule to allow for any/all of these possibilities.    If you cannot keep your appointment, please call [phone] to cancel or reschedule prior to your appointment date.  This allows [place] the opportunity to schedule an appointment for another patient in need of care.  If you do not cancel or reschedule by 5 p.m. the business day prior to your appointment date, you will be charged a $50.00 late cancellation/no-show fee.    Thank you for choosing [facility] for your medical needs.  We appreciate the opportunity to care for you.  Please visit [place] at our website  to learn more about our practice.                     Sincerely,                                                             The Gastroenterology Division

## 2011-01-23 NOTE — Op Note (Signed)
Summary: EGD  NAME:  Kevin [name], Kevin [name]                            ACCOUNT NO.:  [id number]   MEDICAL RECORD NO.:  [id number]                   PATIENT TYPE:  AMB   LOCATION:  ENDO                                 FACILITY:  [facility]   PHYSICIAN:  Georgiana Spinner, M.D.                 DATE OF BIRTH:  1925-01-07   DATE OF PROCEDURE:  05/23/2004  DATE OF DISCHARGE:                                 OPERATIVE REPORT   PROCEDURE:  Upper endoscopy.   INDICATIONS:  Hemoccult-positivity.   ANESTHESIA:  Demerol 50, Versed 6 mg.   DESCRIPTION OF PROCEDURE:  With the patient mildly sedated in the left  lateral decubitus position, the Olympus videoscopic endoscope was inserted  in the mouth, passed under direct vision through the esophagus which  appeared normal, into the stomach through a hiatal hernia.  The fundus,  body, antrum, duodenal bulb, and second portion of the duodenal all appeared  normal.  From this point, the endoscope was slowly withdrawn taking  circumferential views of the duodenal mucosal until the endoscope was pulled  back into the stomach, placed in retroflexion to view the stomach from  below.  The endoscope was straightened and withdrawn taking circumferential  views of the remaining gastric and esophageal mucosa.  The patient's vital  signs and pulse oximeter remained stable.  The patient tolerated the  procedure well and without apparent complications.   FINDINGS:  A large hiatal hernia, otherwise unremarkable examination.   PLAN:  Proceed to colonoscopy.                                               Georgiana Spinner, M.D.    GMO/MEDQ  D:  05/23/2004  T:  05/23/2004  Job:  [id number]

## 2011-01-23 NOTE — Letter (Signed)
Summary: Appointment - Missed  [facility], Main Office  [address]   [place], [place] [address]   Phone: [phone]  Fax: [phone]     October 19, 2010 MRN: [id number]   Kevin [name] [address] [place], [place]  [address]   Dear [name],  Our records indicate you missed your appointment on 10.-19-11 with Dr.  Graciela Husbands .                                    It is very important that we reach you to reschedule this appointment. We look forward to participating in your health care needs. Please contact us at the number listed above at your earliest convenience to reschedule this appointment.     Sincerely,    Glass blower/designer

## 2011-01-23 NOTE — Letter (Signed)
Summary: [facility]  [facility]   Imported By: Roderic Ovens 10/24/2010 12:37:30  _____________________________________________________________________  External Attachment:    Type:   Image     Comment:   External Document

## 2011-01-23 NOTE — Op Note (Signed)
Summary: colonoscopy  NAME:  Kevin [name], Kevin [name]                  ACCOUNT NO.:  [id number]      MEDICAL RECORD NO.:  [id number]          PATIENT TYPE:  AMB      LOCATION:  ENDO                         FACILITY:  [facility]      PHYSICIAN:  Georgiana Spinner, M.D.    DATE OF BIRTH:  April 22, 1925      DATE OF PROCEDURE:  12/04/2007   DATE OF DISCHARGE:                                  OPERATIVE REPORT      PROCEDURE:  Colonoscopy.      INDICATIONS:  Hemoccult positivity, iron-deficiency anemia.      ANESTHESIA:  None further given.      PROCEDURE:  With the patient mildly sedated in the left lateral   decubitus position, the Pentax videoscopic colonoscope was inserted into   the rectum and passed under direct vision with pressure applied and the   patient rolled to his right side.  We were able to reach the base of the   cecum, identified by ileocecal valve and crow's foot of the cecum, which   was photographed.  After exploring the cecum and cleaning it the   endoscope was then withdrawn taking circumferential views the remaining   colonic mucosa as we withdrew all the way to the rectum, stopping only   in the ascending colon where a small polyp approximately 5 mm in size   was seen, photographed and removed using snare cautery technique,   setting of 20/150 blended current.  The polyp was suctioned into the   endoscope and retrieved through a tissue trap.  The endoscope was then,   as noted, withdrawn all the way to the rectum, which appeared normal on   direct, showed hemorrhoids on retroflexed view.  The endoscope was   straightened and withdrawn.  The patient's vital signs and pulse   oximeter remained stable.  The patient tolerated the procedure well   without apparent complications.      FINDINGS:   1. Large internal hemorrhoids.   2. Small polyp at the ascending colon.   3. Otherwise unremarkable examination.      PLAN:  Await biopsy report.  The patient will call me for results and   follow up with me as an outpatient.  Will also proceed to schedule   capsule endoscopy to evaluate further.                  ______________________________   Georgiana Spinner, M.D.            GMO/MEDQ  D:  12/04/2007  T:  12/04/2007  Job:  [id number]

## 2011-01-23 NOTE — Op Note (Signed)
Summary: colonoscopy Dr Virginia [place]  NAME:  Kevin [name], Kevin [name]                            ACCOUNT NO.:  [id number]   MEDICAL RECORD NO.:  [id number]                   PATIENT TYPE:  AMB   LOCATION:  ENDO                                 FACILITY:  [facility]   PHYSICIAN:  Georgiana Spinner, M.D.                 DATE OF BIRTH:  09/23/1925   DATE OF PROCEDURE:  05/23/2004  DATE OF DISCHARGE:                                 OPERATIVE REPORT   PROCEDURE:  Colonoscopy.   ENDOSCOPIST:  Georgiana Spinner, M.D.   INDICATIONS FOR PROCEDURE:  Rectal bleeding.   ANESTHESIA:  Demerol 20 mg, Versed 2 mg.   DESCRIPTION OF PROCEDURE:  With the patient mildly sedated in the left  lateral decubitus position, the Olympus videoscopic colonoscope was inserted  into the rectum and passed, after a normal rectal examination, under direct  vision to the cecum, identified by the ileocecal valve and appendiceal  orifice, both of which were photographed.  From this point the colonoscope  was slowly withdrawn, taking circumferential views of the colonic mucosa,  stopping only in the rectum, which appeared normal on direct and showed  hemorrhoids on retroflexed view.  The endoscope was straightened and  withdrawn.  The patient's vital signs and pulse oximeter remained stable.  The patient tolerated the procedure well with no apparent complications.   FINDINGS:  Internal hemorrhoids, otherwise an unremarkable examination.   PLAN:  Because of what appears to be iron deficiency, I think a small bowel  series is indicated.  I will have the patient follow up with me  subsequently.                                               Georgiana Spinner, M.D.    GMO/MEDQ  D:  05/23/2004  T:  05/23/2004  Job:  [id number]

## 2011-01-23 NOTE — Letter (Signed)
Summary: New Patient letter  [facility]  [address] [place], [place] [address]   Phone: [phone]  Fax: [phone]       08/16/2010 MRN: [id number]  Shontez Poli [address] [place] [address]  Dear Mr. Coriz,  Welcome to the Gastroenterology Division at [facility].    You are scheduled to see Dr. Arlyce Dice on 10/03/2010 at 9:00AM on the 3rd floor at [facility], [address].  We ask that you try to arrive at our office 15 minutes prior to your appointment time to allow for check-in.  We would like you to complete the enclosed self-administered evaluation form prior to your visit and bring it with you on the day of your appointment.  We will review it with you.  Also, please bring a complete list of all your medications or, if you prefer, bring the medication bottles and we will list them.  Please bring your insurance card so that we may make a copy of it.  If your insurance requires a referral to see a specialist, please bring your referral form from your primary care physician.  Co-payments are due at the time of your visit and may be paid by cash, check or credit card.     Your office visit will consist of a consult with your physician (includes a physical exam), any laboratory testing he/she may order, scheduling of any necessary diagnostic testing (e.g. x-ray, ultrasound, CT-scan), and scheduling of a procedure (e.g. Endoscopy, Colonoscopy) if required.  Please allow enough time on your schedule to allow for any/all of these possibilities.    If you cannot keep your appointment, please call [phone] to cancel or reschedule prior to your appointment date.  This allows [place] the opportunity to schedule an appointment for another patient in need of care.  If you do not cancel or reschedule by 5 p.m. the business day prior to your appointment date, you will be charged a $50.00 late cancellation/no-show fee.    Thank you for choosing [facility] for your medical needs.  We appreciate the opportunity to care for you.  Please visit [place] at our website  to learn more about our practice.                     Sincerely,                                                             The Gastroenterology Division

## 2011-01-23 NOTE — Op Note (Signed)
Summary: Flex Sismoidoscopy  NAME:  Kevin [name], Kevin [name]                  ACCOUNT NO.:  [id number]      MEDICAL RECORD NO.:  [id number]          PATIENT TYPE:  INP      LOCATION:  4711                         FACILITY:  [facility]      PHYSICIAN:  Georgiana Spinner, M.D.    DATE OF BIRTH:  January 23, 1925      DATE OF PROCEDURE:  06/10/2007   DATE OF DISCHARGE:                                  OPERATIVE REPORT      PROCEDURE:  Flexible sigmoidoscopy.      INDICATIONS:  Rectal pain.      ANESTHESIA:  None given.      DESCRIPTION OF PROCEDURE:  With the patient in the left lateral   decubitus position, the Pentax videoscopic colonoscope was inserted in   the rectum and passed, under direct vision, to approximately 60 cm from   anal verge at which point the patient encountered mild discomfort at   which point we elected, therefore, to stop further advancement and   withdrew the colonoscope taking circumferential views of the remaining   colonic mucosa stopping only in the rectum which appeared normal on   direct and retroflex view.  The endoscope was straightened and   withdrawn.      The patient's vital signs and pulse oximeter remained stable.  The   patient tolerated the procedure well without apparent complications.      FINDINGS:  Unremarkable exam.      PLAN:  Follow up with me as needed.                  ______________________________   Georgiana Spinner, M.D.            GMO/MEDQ  D:  06/10/2007  T:  06/10/2007  Job:  [id number]

## 2011-01-23 NOTE — Letter (Signed)
Summary: [facility]  [facility]   Imported By: Marylou Mccoy 11/23/2010 17:03:30  _____________________________________________________________________  External Attachment:    Type:   Image     Comment:   External Document

## 2011-01-23 NOTE — Op Note (Signed)
Summary: EGD  NAME:  Kevin [name], Kevin [name]                  ACCOUNT NO.:  [id number]      MEDICAL RECORD NO.:  [id number]          PATIENT TYPE:  AMB      LOCATION:  ENDO                         FACILITY:  [facility]      PHYSICIAN:  Georgiana Spinner, M.D.    DATE OF BIRTH:  12/24/1925      DATE OF PROCEDURE:  12/04/2007   DATE OF DISCHARGE:                                  OPERATIVE REPORT      PROCEDURE:  Upper endoscopy.      INDICATIONS:  Hemoccult positivity with iron-deficiency anemia.      ANESTHESIA:  Fentanyl 80 mcg, Versed 8 mg.      PROCEDURE:  With the patient mildly sedated in the left lateral   decubitus position, the Pentax videoscopic endoscope was inserted in the   mouth, passed under direct vision through the esophagus which appeared   normal into the stomach.  There was a hiatal hernia, fairly large, but   fundus, body, antrum, duodenal bulb, second portion duodenum were   visualized and appeared normal.  From this point, the endoscope was   slowly withdrawn taking circumferential views of the duodenal mucosa   until the endoscope had been pulled back in the stomach and placed in   retroflexion to view the stomach from below, and once again the hiatal   hernia was seen and photographed.  The endoscope was then straightened   and withdrawn, taking circumferential views of the remaining gastric and   esophageal mucosa.  The patient's vital signs and pulse oximeter   remained stable.  The patient tolerated the procedure well without   apparent complications.      FINDINGS:  Large hiatal hernia, but otherwise an unremarkable   examination.      PLAN:  Proceed to colonoscopy.                  ______________________________   Georgiana Spinner, M.D.            GMO/MEDQ  D:  12/04/2007  T:  12/04/2007  Job:  [id number]

## 2011-03-05 LAB — STOOL CULTURE

## 2011-03-05 LAB — URINALYSIS, ROUTINE W REFLEX MICROSCOPIC
Glucose, UA: NEGATIVE mg/dL
Hgb urine dipstick: NEGATIVE
Ketones, ur: NEGATIVE mg/dL
Nitrite: NEGATIVE
Protein, ur: NEGATIVE mg/dL
Specific Gravity, Urine: 1.025 (ref 1.005–1.030)
Urobilinogen, UA: 0.2 mg/dL (ref 0.0–1.0)
pH: 5 (ref 5.0–8.0)

## 2011-03-05 LAB — COMPREHENSIVE METABOLIC PANEL
ALT: 22 U/L (ref 0–53)
AST: 37 U/L (ref 0–37)
Albumin: 2.4 g/dL — ABNORMAL LOW (ref 3.5–5.2)
Alkaline Phosphatase: 54 U/L (ref 39–117)
BUN: 20 mg/dL (ref 6–23)
CO2: 21 mEq/L (ref 19–32)
Calcium: 9.4 mg/dL (ref 8.4–10.5)
Chloride: 108 mEq/L (ref 96–112)
Creatinine, Ser: 1.8 mg/dL — ABNORMAL HIGH (ref 0.4–1.5)
GFR calc Af Amer: 44 mL/min — ABNORMAL LOW (ref >60)
GFR calc non Af Amer: 36 mL/min — ABNORMAL LOW (ref >60)
Glucose, Bld: 168 mg/dL — ABNORMAL HIGH (ref 70–99)
Potassium: 4 mEq/L (ref 3.5–5.1)
Sodium: 138 mEq/L (ref 135–145)
Total Bilirubin: 0.5 mg/dL (ref 0.3–1.2)
Total Protein: 5.9 g/dL — ABNORMAL LOW (ref 6.0–8.3)

## 2011-03-05 LAB — URINE CULTURE
Colony Count: 10000
Culture  Setup Time: 201201040913

## 2011-03-05 LAB — CBC
HCT: 41.1 % (ref 39.0–52.0)
Hemoglobin: 13.7 g/dL (ref 13.0–17.0)
MCH: 31.1 pg (ref 26.0–34.0)
MCHC: 33.3 g/dL (ref 30.0–36.0)
MCV: 93.2 fL (ref 78.0–100.0)
Platelets: 218 10*3/uL (ref 150–400)
RBC: 4.41 MIL/uL (ref 4.22–5.81)
RDW: 15.7 % — ABNORMAL HIGH (ref 11.5–15.5)
WBC: 7.9 10*3/uL (ref 4.0–10.5)

## 2011-03-05 LAB — CLOSTRIDIUM DIFFICILE BY PCR

## 2011-03-05 LAB — DIFFERENTIAL
Basophils Absolute: 0 10*3/uL (ref 0.0–0.1)
Basophils Relative: 0 % (ref 0–1)
Eosinophils Absolute: 0 10*3/uL (ref 0.0–0.7)
Eosinophils Relative: 0 % (ref 0–5)
Lymphocytes Relative: 11 % — ABNORMAL LOW (ref 12–46)
Lymphs Abs: 0.9 10*3/uL (ref 0.7–4.0)
Monocytes Absolute: 0.9 10*3/uL (ref 0.1–1.0)
Monocytes Relative: 12 % (ref 3–12)
Neutro Abs: 6.1 10*3/uL (ref 1.7–7.7)
Neutrophils Relative %: 77 % (ref 43–77)

## 2011-03-05 LAB — GIARDIA/CRYPTOSPORIDIUM SCREEN(EIA)
Cryptosporidium Screen (EIA): NEGATIVE
Giardia Screen - EIA: NEGATIVE

## 2011-03-05 LAB — LACTIC ACID, PLASMA
Lactic Acid, Venous: 4.4 mmol/L — ABNORMAL HIGH (ref 0.5–2.2)
Lactic Acid, Venous: 5.4 mmol/L — ABNORMAL HIGH (ref 0.5–2.2)

## 2011-03-05 LAB — CULTURE, BLOOD (ROUTINE X 2)
Culture  Setup Time: 201201040602
Culture  Setup Time: 201201040910
Culture: NO GROWTH
Culture: NO GROWTH

## 2011-03-05 LAB — CK TOTAL AND CKMB (NOT AT ARMC)
CK, MB: 2.7 ng/mL (ref 0.3–4.0)
Relative Index: INVALID (ref 0.0–2.5)
Total CK: 27 U/L (ref 7–232)

## 2011-03-05 LAB — TROPONIN I: Troponin I: 0.05 ng/mL (ref 0.00–0.06)

## 2011-03-05 LAB — LIPASE, BLOOD: Lipase: 14 U/L (ref 11–59)

## 2011-03-05 LAB — TSH: TSH: 3.775 u[IU]/mL (ref 0.350–4.500)

## 2011-03-07 LAB — SURGICAL PCR SCREEN: MRSA, PCR: NEGATIVE

## 2011-03-08 LAB — URINE CULTURE
Colony Count: NO GROWTH
Culture: NO GROWTH

## 2011-03-08 LAB — GLUCOSE, CAPILLARY
Glucose-Capillary: 104 mg/dL — ABNORMAL HIGH (ref 70–99)
Glucose-Capillary: 105 mg/dL — ABNORMAL HIGH (ref 70–99)
Glucose-Capillary: 106 mg/dL — ABNORMAL HIGH (ref 70–99)
Glucose-Capillary: 106 mg/dL — ABNORMAL HIGH (ref 70–99)
Glucose-Capillary: 107 mg/dL — ABNORMAL HIGH (ref 70–99)
Glucose-Capillary: 108 mg/dL — ABNORMAL HIGH (ref 70–99)
Glucose-Capillary: 114 mg/dL — ABNORMAL HIGH (ref 70–99)
Glucose-Capillary: 120 mg/dL — ABNORMAL HIGH (ref 70–99)
Glucose-Capillary: 121 mg/dL — ABNORMAL HIGH (ref 70–99)
Glucose-Capillary: 122 mg/dL — ABNORMAL HIGH (ref 70–99)
Glucose-Capillary: 130 mg/dL — ABNORMAL HIGH (ref 70–99)
Glucose-Capillary: 137 mg/dL — ABNORMAL HIGH (ref 70–99)
Glucose-Capillary: 180 mg/dL — ABNORMAL HIGH (ref 70–99)
Glucose-Capillary: 80 mg/dL (ref 70–99)
Glucose-Capillary: 81 mg/dL (ref 70–99)
Glucose-Capillary: 86 mg/dL (ref 70–99)
Glucose-Capillary: 88 mg/dL (ref 70–99)
Glucose-Capillary: 89 mg/dL (ref 70–99)
Glucose-Capillary: 89 mg/dL (ref 70–99)
Glucose-Capillary: 92 mg/dL (ref 70–99)
Glucose-Capillary: 93 mg/dL (ref 70–99)
Glucose-Capillary: 95 mg/dL (ref 70–99)
Glucose-Capillary: 96 mg/dL (ref 70–99)
Glucose-Capillary: 96 mg/dL (ref 70–99)
Glucose-Capillary: 97 mg/dL (ref 70–99)

## 2011-03-08 LAB — POCT CARDIAC MARKERS: Myoglobin, poc: 435 ng/mL (ref 12–200)

## 2011-03-08 LAB — URINALYSIS, ROUTINE W REFLEX MICROSCOPIC
Hgb urine dipstick: NEGATIVE
Nitrite: NEGATIVE
Nitrite: POSITIVE — AB
Specific Gravity, Urine: 1.008 (ref 1.005–1.030)
Specific Gravity, Urine: 1.018 (ref 1.005–1.030)
Specific Gravity, Urine: 1.029 (ref 1.005–1.030)
Urobilinogen, UA: 0.2 mg/dL (ref 0.0–1.0)
Urobilinogen, UA: 0.2 mg/dL (ref 0.0–1.0)
Urobilinogen, UA: 0.2 mg/dL (ref 0.0–1.0)
pH: 5.5 (ref 5.0–8.0)

## 2011-03-08 LAB — CBC
HCT: 32.5 % — ABNORMAL LOW (ref 39.0–52.0)
HCT: 33.4 % — ABNORMAL LOW (ref 39.0–52.0)
HCT: 37.4 % — ABNORMAL LOW (ref 39.0–52.0)
Hemoglobin: 12.4 g/dL — ABNORMAL LOW (ref 13.0–17.0)
Hemoglobin: 13.2 g/dL (ref 13.0–17.0)
MCH: 32.3 pg (ref 26.0–34.0)
MCH: 32.4 pg (ref 26.0–34.0)
MCH: 32.5 pg (ref 26.0–34.0)
MCHC: 34.4 g/dL (ref 30.0–36.0)
MCHC: 35.3 g/dL (ref 30.0–36.0)
MCHC: 35.4 g/dL (ref 30.0–36.0)
MCV: 91.7 fL (ref 78.0–100.0)
MCV: 94.6 fL (ref 78.0–100.0)
Platelets: 200 10*3/uL (ref 150–400)
Platelets: 202 10*3/uL (ref 150–400)
Platelets: 203 10*3/uL (ref 150–400)
RBC: 3.32 MIL/uL — ABNORMAL LOW (ref 4.22–5.81)
RBC: 3.46 MIL/uL — ABNORMAL LOW (ref 4.22–5.81)
RBC: 3.49 MIL/uL — ABNORMAL LOW (ref 4.22–5.81)
RDW: 14.5 % (ref 11.5–15.5)
RDW: 14.6 % (ref 11.5–15.5)
RDW: 14.6 % (ref 11.5–15.5)
RDW: 15.1 % (ref 11.5–15.5)
WBC: 5.1 10*3/uL (ref 4.0–10.5)
WBC: 7 10*3/uL (ref 4.0–10.5)

## 2011-03-08 LAB — HEPATIC FUNCTION PANEL
ALT: 18 U/L (ref 0–53)
Alkaline Phosphatase: 57 U/L (ref 39–117)
Indirect Bilirubin: 0.4 mg/dL (ref 0.3–0.9)
Total Bilirubin: 0.6 mg/dL (ref 0.3–1.2)
Total Protein: 5.2 g/dL — ABNORMAL LOW (ref 6.0–8.3)

## 2011-03-08 LAB — URINE MICROSCOPIC-ADD ON

## 2011-03-08 LAB — BASIC METABOLIC PANEL
BUN: 10 mg/dL (ref 6–23)
BUN: 12 mg/dL (ref 6–23)
CO2: 24 mEq/L (ref 19–32)
CO2: 27 mEq/L (ref 19–32)
CO2: 27 mEq/L (ref 19–32)
Calcium: 9.2 mg/dL (ref 8.4–10.5)
Calcium: 9.5 mg/dL (ref 8.4–10.5)
Calcium: 9.5 mg/dL (ref 8.4–10.5)
Chloride: 112 mEq/L (ref 96–112)
Creatinine, Ser: 0.95 mg/dL (ref 0.4–1.5)
Creatinine, Ser: 0.97 mg/dL (ref 0.4–1.5)
Creatinine, Ser: 1 mg/dL (ref 0.4–1.5)
GFR calc Af Amer: >60 mL/min (ref >60)
GFR calc Af Amer: >60 mL/min (ref >60)
GFR calc non Af Amer: >60 mL/min (ref >60)
GFR calc non Af Amer: >60 mL/min (ref >60)
Glucose, Bld: 110 mg/dL — ABNORMAL HIGH (ref 70–99)
Glucose, Bld: 85 mg/dL (ref 70–99)
Glucose, Bld: 99 mg/dL (ref 70–99)
Potassium: 4 mEq/L (ref 3.5–5.1)
Sodium: 144 mEq/L (ref 135–145)
Sodium: 145 mEq/L (ref 135–145)

## 2011-03-08 LAB — COMPREHENSIVE METABOLIC PANEL
ALT: 13 U/L (ref 0–53)
ALT: 14 U/L (ref 0–53)
Albumin: 2.7 g/dL — ABNORMAL LOW (ref 3.5–5.2)
Alkaline Phosphatase: 60 U/L (ref 39–117)
Alkaline Phosphatase: 67 U/L (ref 39–117)
BUN: 22 mg/dL (ref 6–23)
CO2: 25 mEq/L (ref 19–32)
Calcium: 10.2 mg/dL (ref 8.4–10.5)
GFR calc Af Amer: >60 mL/min (ref >60)
GFR calc non Af Amer: 40 mL/min — ABNORMAL LOW (ref >60)
Glucose, Bld: 110 mg/dL — ABNORMAL HIGH (ref 70–99)
Potassium: 3.2 mEq/L — ABNORMAL LOW (ref 3.5–5.1)
Sodium: 139 mEq/L (ref 135–145)
Sodium: 141 mEq/L (ref 135–145)
Total Protein: 5.2 g/dL — ABNORMAL LOW (ref 6.0–8.3)

## 2011-03-08 LAB — DIFFERENTIAL
Basophils Relative: 1 % (ref 0–1)
Eosinophils Absolute: 0.2 10*3/uL (ref 0.0–0.7)
Lymphs Abs: 1.3 10*3/uL (ref 0.7–4.0)
Neutro Abs: 6.7 10*3/uL (ref 1.7–7.7)
Neutrophils Relative %: 76 % (ref 43–77)

## 2011-03-08 LAB — CULTURE, BLOOD (ROUTINE X 2)
Culture  Setup Time: 201110050159
Culture  Setup Time: 201110102138
Culture: NO GROWTH
Culture: NO GROWTH
Culture: NO GROWTH

## 2011-03-08 LAB — MAGNESIUM: Magnesium: 2 mg/dL (ref 1.5–2.5)

## 2011-03-08 LAB — PROTIME-INR: Prothrombin Time: 14.4 seconds (ref 11.6–15.2)

## 2011-03-08 LAB — LACTIC ACID, PLASMA: Lactic Acid, Venous: 0.9 mmol/L (ref 0.5–2.2)

## 2011-03-09 LAB — BASIC METABOLIC PANEL
BUN: 10 mg/dL (ref 6–23)
BUN: 11 mg/dL (ref 6–23)
BUN: 9 mg/dL (ref 6–23)
CO2: 26 mEq/L (ref 19–32)
CO2: 27 mEq/L (ref 19–32)
Calcium: 9.4 mg/dL (ref 8.4–10.5)
Calcium: 9.5 mg/dL (ref 8.4–10.5)
Calcium: 9.8 mg/dL (ref 8.4–10.5)
Chloride: 104 mEq/L (ref 96–112)
Chloride: 107 mEq/L (ref 96–112)
Chloride: 109 mEq/L (ref 96–112)
Creatinine, Ser: 0.82 mg/dL (ref 0.4–1.5)
Creatinine, Ser: 0.92 mg/dL (ref 0.4–1.5)
GFR calc Af Amer: >60 mL/min (ref >60)
GFR calc Af Amer: >60 mL/min (ref >60)
GFR calc Af Amer: >60 mL/min (ref >60)
GFR calc non Af Amer: >60 mL/min (ref >60)
GFR calc non Af Amer: >60 mL/min (ref >60)
GFR calc non Af Amer: >60 mL/min (ref >60)
Glucose, Bld: 102 mg/dL — ABNORMAL HIGH (ref 70–99)
Glucose, Bld: 92 mg/dL (ref 70–99)
Glucose, Bld: 92 mg/dL (ref 70–99)
Potassium: 3.6 mEq/L (ref 3.5–5.1)
Potassium: 3.9 mEq/L (ref 3.5–5.1)
Potassium: 4.3 mEq/L (ref 3.5–5.1)
Sodium: 135 mEq/L (ref 135–145)
Sodium: 138 mEq/L (ref 135–145)
Sodium: 141 mEq/L (ref 135–145)

## 2011-03-09 LAB — HEPATIC FUNCTION PANEL
ALT: 13 U/L (ref 0–53)
AST: 16 U/L (ref 0–37)
Albumin: 3.2 g/dL — ABNORMAL LOW (ref 3.5–5.2)
Alkaline Phosphatase: 55 U/L (ref 39–117)
Bilirubin, Direct: <0.1 mg/dL (ref 0.0–0.3)
Total Bilirubin: 0.4 mg/dL (ref 0.3–1.2)
Total Protein: 5.6 g/dL — ABNORMAL LOW (ref 6.0–8.3)

## 2011-03-09 LAB — CBC
HCT: 33.6 % — ABNORMAL LOW (ref 39.0–52.0)
HCT: 34.5 % — ABNORMAL LOW (ref 39.0–52.0)
HCT: 36.2 % — ABNORMAL LOW (ref 39.0–52.0)
Hemoglobin: 11.6 g/dL — ABNORMAL LOW (ref 13.0–17.0)
Hemoglobin: 12.7 g/dL — ABNORMAL LOW (ref 13.0–17.0)
MCH: 32.1 pg (ref 26.0–34.0)
MCH: 32.2 pg (ref 26.0–34.0)
MCHC: 34.5 g/dL (ref 30.0–36.0)
MCHC: 35.1 g/dL (ref 30.0–36.0)
MCHC: 35.1 g/dL (ref 30.0–36.0)
MCV: 91.9 fL (ref 78.0–100.0)
MCV: 93.1 fL (ref 78.0–100.0)
MCV: 93.5 fL (ref 78.0–100.0)
Platelets: 165 10*3/uL (ref 150–400)
Platelets: 175 10*3/uL (ref 150–400)
Platelets: 179 10*3/uL (ref 150–400)
RBC: 3.61 MIL/uL — ABNORMAL LOW (ref 4.22–5.81)
RBC: 3.94 MIL/uL — ABNORMAL LOW (ref 4.22–5.81)
RDW: 14.2 % (ref 11.5–15.5)
RDW: 14.2 % (ref 11.5–15.5)
RDW: 14.3 % (ref 11.5–15.5)
RDW: 14.4 % (ref 11.5–15.5)
WBC: 3.9 10*3/uL — ABNORMAL LOW (ref 4.0–10.5)
WBC: 4.4 10*3/uL (ref 4.0–10.5)
WBC: 5.4 10*3/uL (ref 4.0–10.5)

## 2011-03-09 LAB — GLUCOSE, CAPILLARY: Glucose-Capillary: 97 mg/dL (ref 70–99)

## 2011-03-09 LAB — URINALYSIS, ROUTINE W REFLEX MICROSCOPIC
Bilirubin Urine: NEGATIVE
Glucose, UA: NEGATIVE mg/dL
Hgb urine dipstick: NEGATIVE
Ketones, ur: NEGATIVE mg/dL
Nitrite: NEGATIVE
Protein, ur: NEGATIVE mg/dL
Specific Gravity, Urine: 1.019 (ref 1.005–1.030)
Urobilinogen, UA: 1 mg/dL (ref 0.0–1.0)
pH: 6.5 (ref 5.0–8.0)

## 2011-03-09 LAB — DIFFERENTIAL
Basophils Absolute: 0 10*3/uL (ref 0.0–0.1)
Eosinophils Relative: 12 % — ABNORMAL HIGH (ref 0–5)
Lymphocytes Relative: 43 % (ref 12–46)
Neutro Abs: 1.6 10*3/uL — ABNORMAL LOW (ref 1.7–7.7)
Neutrophils Relative %: 37 % — ABNORMAL LOW (ref 43–77)

## 2011-03-09 LAB — BRAIN NATRIURETIC PEPTIDE: Pro B Natriuretic peptide (BNP): 61 pg/mL (ref 0.0–100.0)

## 2011-03-09 LAB — TSH: TSH: 1.485 u[IU]/mL (ref 0.350–4.500)

## 2011-03-09 LAB — VITAMIN B12: Vitamin B-12: 422 pg/mL (ref 211–911)

## 2011-03-09 LAB — AMMONIA: Ammonia: 42 umol/L — ABNORMAL HIGH (ref 11–35)

## 2011-03-28 LAB — BASIC METABOLIC PANEL
Calcium: 9.6 mg/dL (ref 8.4–10.5)
GFR calc Af Amer: >60 mL/min (ref >60)
GFR calc non Af Amer: >60 mL/min (ref >60)
Glucose, Bld: 107 mg/dL — ABNORMAL HIGH (ref 70–99)
Potassium: 3.9 mEq/L (ref 3.5–5.1)
Sodium: 138 mEq/L (ref 135–145)

## 2011-03-28 LAB — CBC
HCT: 36.1 % — ABNORMAL LOW (ref 39.0–52.0)
Hemoglobin: 12.2 g/dL — ABNORMAL LOW (ref 13.0–17.0)
RBC: 3.81 MIL/uL — ABNORMAL LOW (ref 4.22–5.81)
RDW: 14.9 % (ref 11.5–15.5)

## 2011-03-29 LAB — URINALYSIS, ROUTINE W REFLEX MICROSCOPIC
Bilirubin Urine: NEGATIVE
Glucose, UA: NEGATIVE mg/dL
Glucose, UA: NEGATIVE mg/dL
Hgb urine dipstick: NEGATIVE
Hgb urine dipstick: NEGATIVE
Protein, ur: 30 mg/dL — AB
Specific Gravity, Urine: 1.025 (ref 1.005–1.030)
pH: 6 (ref 5.0–8.0)

## 2011-03-29 LAB — COMPREHENSIVE METABOLIC PANEL
AST: 17 U/L (ref 0–37)
Albumin: 2.9 g/dL — ABNORMAL LOW (ref 3.5–5.2)
BUN: 22 mg/dL (ref 6–23)
Calcium: 9.6 mg/dL (ref 8.4–10.5)
Chloride: 106 mEq/L (ref 96–112)
Creatinine, Ser: 1.11 mg/dL (ref 0.4–1.5)
GFR calc Af Amer: >60 mL/min (ref >60)
GFR calc non Af Amer: >60 mL/min (ref >60)
Total Bilirubin: 0.5 mg/dL (ref 0.3–1.2)

## 2011-03-29 LAB — CBC
HCT: 39.9 % (ref 39.0–52.0)
HCT: 40.3 % (ref 39.0–52.0)
MCHC: 34.2 g/dL (ref 30.0–36.0)
MCHC: 34.5 g/dL (ref 30.0–36.0)
MCV: 94.3 fL (ref 78.0–100.0)
MCV: 95 fL (ref 78.0–100.0)
Platelets: 233 10*3/uL (ref 150–400)
Platelets: 237 10*3/uL (ref 150–400)
Platelets: 242 10*3/uL (ref 150–400)
RDW: 14.4 % (ref 11.5–15.5)
RDW: 14.7 % (ref 11.5–15.5)
WBC: 4.3 10*3/uL (ref 4.0–10.5)
WBC: 5.1 10*3/uL (ref 4.0–10.5)
WBC: 6.1 10*3/uL (ref 4.0–10.5)

## 2011-03-29 LAB — BASIC METABOLIC PANEL
BUN: 13 mg/dL (ref 6–23)
BUN: 16 mg/dL (ref 6–23)
BUN: 32 mg/dL — ABNORMAL HIGH (ref 6–23)
CO2: 27 mEq/L (ref 19–32)
CO2: 27 mEq/L (ref 19–32)
Calcium: 9.7 mg/dL (ref 8.4–10.5)
Calcium: 10.2 mg/dL (ref 8.4–10.5)
Chloride: 104 mEq/L (ref 96–112)
Chloride: 106 mEq/L (ref 96–112)
Chloride: 108 mEq/L (ref 96–112)
Creatinine, Ser: 0.89 mg/dL (ref 0.4–1.5)
Creatinine, Ser: 0.93 mg/dL (ref 0.4–1.5)
Creatinine, Ser: 0.99 mg/dL (ref 0.4–1.5)
GFR calc Af Amer: >60 mL/min (ref >60)
GFR calc non Af Amer: >60 mL/min (ref >60)
Glucose, Bld: 102 mg/dL — ABNORMAL HIGH (ref 70–99)
Glucose, Bld: 91 mg/dL (ref 70–99)
Glucose, Bld: 92 mg/dL (ref 70–99)
Glucose, Bld: 113 mg/dL — ABNORMAL HIGH (ref 70–99)
Potassium: 3.1 mEq/L — ABNORMAL LOW (ref 3.5–5.1)
Potassium: 3.6 mEq/L (ref 3.5–5.1)
Sodium: 140 mEq/L (ref 135–145)

## 2011-03-29 LAB — POCT CARDIAC MARKERS
CKMB, poc: <1 ng/mL — ABNORMAL LOW (ref 1.0–8.0)
CKMB, poc: 2.1 ng/mL (ref 1.0–8.0)
Myoglobin, poc: 184 ng/mL (ref 12–200)
Troponin i, poc: <0.05 ng/mL (ref 0.00–0.09)
Troponin i, poc: <0.05 ng/mL (ref 0.00–0.09)

## 2011-03-29 LAB — DIFFERENTIAL
Basophils Absolute: 0 10*3/uL (ref 0.0–0.1)
Basophils Absolute: 0 10*3/uL (ref 0.0–0.1)
Lymphocytes Relative: 37 % (ref 12–46)
Lymphocytes Relative: 30 % (ref 12–46)
Lymphs Abs: 1.9 10*3/uL (ref 0.7–4.0)
Lymphs Abs: 1.8 10*3/uL (ref 0.7–4.0)
Monocytes Absolute: 0.4 10*3/uL (ref 0.1–1.0)
Neutro Abs: 2.5 10*3/uL (ref 1.7–7.7)
Neutro Abs: 3.5 10*3/uL (ref 1.7–7.7)

## 2011-03-29 LAB — RAPID URINE DRUG SCREEN, HOSP PERFORMED
Amphetamines: NOT DETECTED
Barbiturates: NOT DETECTED
Benzodiazepines: POSITIVE — AB
Cocaine: NOT DETECTED
Opiates: NOT DETECTED
Tetrahydrocannabinol: NOT DETECTED

## 2011-03-29 LAB — VITAMIN B12: Vitamin B-12: 453 pg/mL (ref 211–911)

## 2011-03-29 LAB — URINE MICROSCOPIC-ADD ON

## 2011-03-30 LAB — URINE MICROSCOPIC-ADD ON

## 2011-03-30 LAB — CBC
HCT: 47.3 % (ref 39.0–52.0)
Platelets: 310 10*3/uL (ref 150–400)
RDW: 15.2 % (ref 11.5–15.5)

## 2011-03-30 LAB — BASIC METABOLIC PANEL
BUN: 12 mg/dL (ref 6–23)
Calcium: 10.5 mg/dL (ref 8.4–10.5)
Creatinine, Ser: 1 mg/dL (ref 0.4–1.5)
GFR calc non Af Amer: >60 mL/min (ref >60)
Glucose, Bld: 97 mg/dL (ref 70–99)

## 2011-03-30 LAB — POCT TOXICOLOGY PANEL

## 2011-03-30 LAB — URINALYSIS, ROUTINE W REFLEX MICROSCOPIC
Nitrite: NEGATIVE
Specific Gravity, Urine: 1.022 (ref 1.005–1.030)
Urobilinogen, UA: 0.2 mg/dL (ref 0.0–1.0)

## 2011-04-02 LAB — COMPREHENSIVE METABOLIC PANEL
ALT: 9 U/L (ref 0–53)
AST: 21 U/L (ref 0–37)
Albumin: 3 g/dL — ABNORMAL LOW (ref 3.5–5.2)
Alkaline Phosphatase: 64 U/L (ref 39–117)
Alkaline Phosphatase: 65 U/L (ref 39–117)
BUN: 10 mg/dL (ref 6–23)
BUN: 10 mg/dL (ref 6–23)
CO2: 28 mEq/L (ref 19–32)
Chloride: 106 mEq/L (ref 96–112)
Chloride: 108 mEq/L (ref 96–112)
GFR calc Af Amer: >60 mL/min (ref >60)
Glucose, Bld: 88 mg/dL (ref 70–99)
Potassium: 3.7 mEq/L (ref 3.5–5.1)
Potassium: 3.9 mEq/L (ref 3.5–5.1)
Sodium: 141 mEq/L (ref 135–145)
Total Bilirubin: 0.6 mg/dL (ref 0.3–1.2)
Total Bilirubin: 0.7 mg/dL (ref 0.3–1.2)
Total Protein: 5.8 g/dL — ABNORMAL LOW (ref 6.0–8.3)

## 2011-04-02 LAB — TSH: TSH: 1.616 u[IU]/mL (ref 0.350–4.500)

## 2011-04-02 LAB — URINALYSIS, ROUTINE W REFLEX MICROSCOPIC
Bilirubin Urine: NEGATIVE
Glucose, UA: NEGATIVE mg/dL
Ketones, ur: NEGATIVE mg/dL
Protein, ur: NEGATIVE mg/dL
pH: 6.5 (ref 5.0–8.0)

## 2011-04-02 LAB — DIFFERENTIAL
Basophils Absolute: 0 10*3/uL (ref 0.0–0.1)
Basophils Relative: 1 % (ref 0–1)
Basophils Relative: 1 % (ref 0–1)
Eosinophils Relative: 8 % — ABNORMAL HIGH (ref 0–5)
Monocytes Absolute: 0.3 10*3/uL (ref 0.1–1.0)
Monocytes Relative: 6 % (ref 3–12)
Neutro Abs: 2.1 10*3/uL (ref 1.7–7.7)
Neutro Abs: 2.3 10*3/uL (ref 1.7–7.7)
Neutrophils Relative %: 48 % (ref 43–77)

## 2011-04-02 LAB — RAPID URINE DRUG SCREEN, HOSP PERFORMED
Amphetamines: NOT DETECTED
Benzodiazepines: POSITIVE — AB
Cocaine: NOT DETECTED
Tetrahydrocannabinol: NOT DETECTED

## 2011-04-02 LAB — ETHANOL: Alcohol, Ethyl (B): <5 mg/dL (ref 0–10)

## 2011-04-02 LAB — CBC
HCT: 36.1 % — ABNORMAL LOW (ref 39.0–52.0)
Hemoglobin: 12.3 g/dL — ABNORMAL LOW (ref 13.0–17.0)
Platelets: 211 10*3/uL (ref 150–400)
RDW: 18.3 % — ABNORMAL HIGH (ref 11.5–15.5)
WBC: 4.2 10*3/uL (ref 4.0–10.5)
WBC: 4.4 10*3/uL (ref 4.0–10.5)

## 2011-04-02 LAB — TRICYCLICS SCREEN, URINE: TCA Scrn: NOT DETECTED

## 2011-04-05 LAB — COMPREHENSIVE METABOLIC PANEL
ALT: 13 U/L (ref 0–53)
AST: 17 U/L (ref 0–37)
Albumin: 3.3 g/dL — ABNORMAL LOW (ref 3.5–5.2)
Alkaline Phosphatase: 97 U/L (ref 39–117)
BUN: 11 mg/dL (ref 6–23)
CO2: 26 mEq/L (ref 19–32)
Calcium: 9.3 mg/dL (ref 8.4–10.5)
Chloride: 107 mEq/L (ref 96–112)
Creatinine, Ser: 1.03 mg/dL (ref 0.4–1.5)
GFR calc Af Amer: >60 mL/min (ref >60)
GFR calc non Af Amer: >60 mL/min (ref >60)
Glucose, Bld: 101 mg/dL — ABNORMAL HIGH (ref 70–99)
Potassium: 3.9 mEq/L (ref 3.5–5.1)
Sodium: 139 mEq/L (ref 135–145)
Total Bilirubin: 0.6 mg/dL (ref 0.3–1.2)
Total Protein: 6.3 g/dL (ref 6.0–8.3)

## 2011-04-05 LAB — POCT I-STAT 3, VENOUS BLOOD GAS (G3P V)
pCO2, Ven: 43.4 mmHg — ABNORMAL LOW (ref 45.0–50.0)
pH, Ven: 7.392 — ABNORMAL HIGH (ref 7.250–7.300)

## 2011-04-05 LAB — CARDIOLIPIN ANTIBODY: Phospholipids: 196 mg/dL (ref 160–300)

## 2011-04-05 LAB — CBC
HCT: 41.3 % (ref 39.0–52.0)
Hemoglobin: 13.5 g/dL (ref 13.0–17.0)
MCHC: 32.7 g/dL (ref 30.0–36.0)
RBC: 4.54 MIL/uL (ref 4.22–5.81)

## 2011-04-05 LAB — CULTURE, BLOOD (ROUTINE X 2): Culture: NO GROWTH

## 2011-04-05 LAB — RAPID URINE DRUG SCREEN, HOSP PERFORMED: Benzodiazepines: POSITIVE — AB

## 2011-04-05 LAB — MAGNESIUM: Magnesium: 2.3 mg/dL (ref 1.5–2.5)

## 2011-04-05 LAB — URINALYSIS, ROUTINE W REFLEX MICROSCOPIC
Bilirubin Urine: NEGATIVE
Glucose, UA: NEGATIVE mg/dL
Hgb urine dipstick: NEGATIVE
Ketones, ur: NEGATIVE mg/dL
Nitrite: NEGATIVE
Protein, ur: NEGATIVE mg/dL
Specific Gravity, Urine: 1.016 (ref 1.005–1.030)
Urobilinogen, UA: 0.2 mg/dL (ref 0.0–1.0)
pH: 7 (ref 5.0–8.0)

## 2011-04-05 LAB — DIFFERENTIAL
Basophils Absolute: 0 10*3/uL (ref 0.0–0.1)
Basophils Relative: 1 % (ref 0–1)
Eosinophils Absolute: 0.4 10*3/uL (ref 0.0–0.7)
Neutrophils Relative %: 42 % — ABNORMAL LOW (ref 43–77)

## 2011-04-05 LAB — URINE MICROSCOPIC-ADD ON

## 2011-04-05 LAB — PROTIME-INR: Prothrombin Time: 15 seconds (ref 11.6–15.2)

## 2011-04-05 LAB — VITAMIN B12: Vitamin B-12: 404 pg/mL (ref 211–911)

## 2011-05-08 NOTE — Discharge Summary (Signed)
NAME:  Kevin [name], Kevin [name]                  ACCOUNT NO.:  [id number]   MEDICAL RECORD NO.:  [id number]          PATIENT TYPE:  INP   LOCATION:  4737                         FACILITY:  [facility]   PHYSICIAN:  Eduard Clos, MDDATE OF BIRTH:  04-Aug-1925   DATE OF ADMISSION:  09/04/2008  DATE OF DISCHARGE:  09/07/2008                               DISCHARGE SUMMARY   COURSE IN THE HOSPITAL:  An 77 year old male with history of nonischemic  cardiomyopathy,  hypertension, depression, was brought into the ER, the  patient already had generalized weakness.  On admission, the patient had  CT head which did not show any acute finding.  The EKG was showing a  paced rhythm.  Cardiac enzymes were followed which were at acceptable  limits.  The patient had endorsed some suicidal ideation for which  Psychiatric consult was obtained with Dr. Jeanie Sewer.  The patient was  placed on a sitter 1:1 with suicide precaution as per Dr. Jeanie Sewer.  The patient can be discharged home on Effexor dose and to follow up as  outpatient.  The patient was found to have B12 deficiency for which  intramuscular dose of vitamin B12 has been given.  The patient had  formerly received 2 doses, more doses to be received as outpatient.  The  patient also had a nonsustained V tach.  Cardiology consult was  obtained.  As per Cardiology, no further workup was necessary at this  time as the patient already has AICD and at this time, the patient is  completely asymptomatic and has no suicidal ideation, and would be  discharged home.   FINAL DIAGNOSES:  1. Depression with suicidal ideation.  2. Nonsustained ventricular tachycardia.  3. History of nonischemic cardiomyopathy with ejection fraction of      35%, status post automatic implantable cardioverter-defibrillator      placement and pacemaker.  4. Hypertension.  5. B12 deficiency.   MEDICATIONS AT DISCHARGE:  1. Lotensin 20 mg p.o. daily.  2. Effexor 75 mg p.o. twice daily.  3. Aspirin 81 mg p.o. daily.  4. Coreg 25 mg twice a day.  5. Triamterene and HCTZ 37.5/25 mg p.o. daily.  6. KCl 20 mEq p.o. daily.  7. Vitamin B12 1000 mcg intramuscular 1 dose daily for the next 5 days      followed by 1 dose every month.   PLAN:  The patient advised to follow up with his primary care physician  within 3 days and recheck his BMET to follow with cardiologist scheduled  to be on a cardiac healthy diet, fall precautions.  Plan was discussed  with the patient's daughter, Ms. [name].     Eduard Clos, MD  Electronically Signed    ANK/MEDQ  D:  09/07/2008  T:  09/08/2008  Job:  [id number]
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
## 2011-05-08 NOTE — Discharge Summary (Signed)
NAME:  Kevin [name], Kevin [name]                  ACCOUNT NO.:  [id number]   MEDICAL RECORD NO.:  [id number]          PATIENT TYPE:  INP   LOCATION:  5528                         FACILITY:  [facility]   PHYSICIAN:  Ruthy Dick, MD    DATE OF BIRTH:  12-Aug-1925   DATE OF ADMISSION:  03/16/2009  DATE OF DISCHARGE:  03/21/2009                               DISCHARGE SUMMARY   REASON FOR ADMISSION:  Altered mental status and vomiting.   FINAL DISCHARGE DIAGNOSES:  1. Altered mental status probably due to psychotropic drugs.  2. 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  3. Nonischemic cardiomyopathy with ejection fraction of 25-30%.  4. Hypertension.  5. Seizure disorder.  6. Obstructive sleep apnea.   CONSULTS DURING THIS ADMISSION:  Psychiatric consult for medication  evaluation.   BRIEF HISTORY OF PRESENT ILLNESS AND HOSPITAL COURSE:  Kevin [name] is an 77-  year-old gentleman with a history of obstructive sleep apnea,  hypertension baseline, left bundle-branch block and seizure disorder.  The patient also has a nonischemic cardiomyopathy with AICD placed in  2007.  He came in with altered mentation and was combative at home and  because of this he was brought to the emergency room.  All his prior  medications were put on hold and the patient was admitted to the floor  for further evaluation.  Psych was invited to join in the management and  Dr. Jeanie Sewer recommended that the patient should be transferred to a  geriatric psych facility and he also ordered a one-to-one observation  because of suicidality and possible elopement.  He started in addition  to his other medications and the patient did well on this regimen.  He  seems to turn around in his mentation according to family members and  seems to be around his baseline.  He denies suicidality at this time and  I spoke to two family members today and they are very happy with the  progress he has made.  Dr. Jeanie Sewer had discussed  with the family about  a possible transfer to a geriatric facility and the family members were  in agreement but as of today they have all refused to have the patient  sent to such facility.  Given that the patient has turned around very  well, he should be able to do well at home.  I have also spoken to them  and this is their wishes as the patient also says he would like to go  home.  He is fully oriented x3 and seems to be holding good conversation  with me today.  We would allow the patient to go home if Dr. Jeanie Sewer  would address the issues of suicidality and elopement.  So he is okay  with him then the patient may be able to go home today to follow up with  doctors.   MEDICATIONS:  1. Aspirin 81 mg daily.  2. Coreg 25 mg b.i.d.  3. Fluoxetine 20 mg daily.  4. Benazepril 20 mg daily.  5. Lamotrigine 50 mg at bedtime.  6. Zyprexa 5 mg p.o. daily.  7. Ativan 0.5 mg p.o. q.6 h. p.r.n. for agitation.   He is to follow up with Dr. Alanda Amass, his cardiologist as scheduled, to  follow up with Dr. Zollie Beckers in 1-2 weeks and to follow with Psychiatry in  2 weeks.   TIME USED FOR DISCHARGE PLANNING:  Greater than 30 minutes.   PHYSICAL EXAMINATION:  VITALS:  Stable.  Temperature 98.6, pulse 67,  respiration 22, blood pressure 140/91, saturating 94% on room air.  CHEST:  Clear to auscultation bilaterally.  ABDOMEN:  Soft, nontender.  EXTREMITIES:  No clubbing, no cyanosis and no edema.  CARDIOVASCULAR:  First and second heart sounds only.  NEUROLOGIC:  Central nervous system nonfocal.      Ruthy Dick, MD  Electronically Signed     GU/MEDQ  D:  03/21/2009  T:  03/22/2009  Job:  [id number]   cc:   Soyla Murphy. Renne Crigler, M.D.  Richard A. Alanda Amass, M.D.  Antonietta Breach, M.D.

## 2011-05-08 NOTE — Op Note (Signed)
NAME:  ABEER, IVERSEN                  ACCOUNT NO.:  [id number]   MEDICAL RECORD NO.:  [id number]          PATIENT TYPE:  INP   LOCATION:  4711                         FACILITY:  [facility]   PHYSICIAN:  Georgiana Spinner, M.D.    DATE OF BIRTH:  02-Mar-1925   DATE OF PROCEDURE:  06/10/2007  DATE OF DISCHARGE:                               OPERATIVE REPORT   PROCEDURE:  Flexible sigmoidoscopy.   INDICATIONS:  Rectal pain.   ANESTHESIA:  None given.   DESCRIPTION OF PROCEDURE:  With the patient in the left lateral  decubitus position, the Pentax videoscopic colonoscope was inserted in  the rectum and passed, under direct vision, to approximately 60 cm from  anal verge at which point the patient encountered mild discomfort at  which point we elected, therefore, to stop further advancement and  withdrew the colonoscope taking circumferential views of the remaining  colonic mucosa stopping only in the rectum which appeared normal on  direct and retroflex view.  The endoscope was straightened and  withdrawn.   The patient's vital signs and pulse oximeter remained stable.  The  patient tolerated the procedure well without apparent complications.   FINDINGS:  Unremarkable exam.   PLAN:  Follow up with me as needed.           ______________________________  Georgiana Spinner, M.D.     GMO/MEDQ  D:  06/10/2007  T:  06/10/2007  Job:  [id number]

## 2011-05-08 NOTE — H&P (Signed)
NAME:  Kevin [name], Kevin [name]                  ACCOUNT NO.:  [id number]   MEDICAL RECORD NO.:  [id number]          PATIENT TYPE:  INP   LOCATION:  4728                         FACILITY:  [facility]   PHYSICIAN:  Eduard Clos, MDDATE OF BIRTH:  Aug 10, 1925   DATE OF ADMISSION:  03/16/2009  DATE OF DISCHARGE:                              HISTORY & PHYSICAL   PRIMARY CARE PHYSICIAN:  Soyla Murphy. Renne Crigler, MD   PRIMARY CARDIOLOGIST:  Pearletha Furl. Alanda Amass, MD   HISTORY OF PRESENT ILLNESS:  Mr. Spadafore is an 77 year old gentleman who  presented to the emergency department with 2 weeks of worsening mental  status per his family.  They reports increasing lethargy, decreased  appetite, and confusion worse than his baseline.  He has not had any  behavior abnormalities in terms of combativeness or aggression.  He has  not been participating in his normal daily activities.  On Tuesday  evening, the family had a difficult time getting him out of bed.  He  tends to sleep more than usual.  He is oriented only to person and not  to place or time.  The family says he has some dementia at baseline.  They say that he has not complained of any chest pain.  There has been  no fever, shortness of breath, or change in his physical health.  He has  had no sick contacts.  No change in his bowel or bladder habits.  Notably, he has a long history of depression.  The family has been  responsible for giving his medications.  There had been no recent  medication changes.   PAST MEDICAL HISTORY:  1. Nonischemic cardiomyopathy with EF of 25-35%.  2. AICD placed in May 2007, primary cardiologist, Dr. Alanda Amass.  3. History of seizure followed by Dr. Sandria Manly, Neurology.  This was an      one-time event in the setting of taking Wellbutrin.  4. Severe depression.  5. Sleep apnea, uses CPAP at home.  6. Hypertension.  7. Baseline left bundle-branch block on EKG.  8. History of left and right knee surgery in 2001.  9. History of left  shoulder surgery in 2001.  10.History of hernia repair in 2001.   MEDICATIONS:  1. Aspirin 81 mg p.o. daily.  2. Carvedilol 25 mg p.o. b.i.d.  3. Clonazepam 0.5 mg p.o. b.i.d.  4. Aricept 5 mg p.o. daily.  5. Fluoxetine 20 mg p.o. daily.  6. Benazepril 20 mg p.o. daily.  7. Venlafaxine 25 mg p.o. daily.  8. Mirtazapine 30 mg p.o. q.h.s.  9. Risperdal 0.5 mg p.o. nightly.  10.Lamotrigine 50 mg p.o. q.h.s.   SOCIAL HISTORY:  He is married, retired from Orthoptist work.  He has 1  daughter with whom the patient is currently living.  He completed the  eighth grade.  No smoker.  No alcohol.  No illicit drugs.   FAMILY HISTORY:  His mother and father both were deceased of cancer at  the age of 58.  Cancer type is unknown.  He has a brother who has  diabetes.   REVIEW OF  SYSTEMS:  Negative except for indicated in history present  illness.   PHYSICAL EXAMINATION:  VITAL SIGNS:  Blood pressure 121/80, heart rate  76, T-max 97.6, respiratory rate 18, and SPO2 98% on room air.  GENERAL:  The patient is an elderly gentleman who is alert, but not  oriented to place and time.  He said the year is 2007.  He said he is in  [place], [place]; however, he does not know he is in the hospital.  HEENT:  His mucous membranes appear very dry as well as his skin.  He is  slightly disheveled appearing.  Head is atraumatic, normocephalic.  Pupils are equal, round, and reactive light.  Extraocular muscles are  intact.  Visual fields are full.  Poor dentition.  LUNGS:  Clear to auscultation bilaterally.  HEART:  Regular rate and rhythm.  There is an AICD placed over the left  side of his chest.  There is no tenderness and redness at the location  of the ICD.  ABDOMEN:  Soft, nontender.  There is no hepatosplenomegaly.  Bowel  sounds are active.  EXTREMITIES:  Without edema.  MUSCULOSKELETAL:  No joint deformities.  He moves all 4 extremities.  NEUROLOGIC:  Cranial nerves are intact.  He has equal strength  5/5  bilaterally in his upper and lower extremities.  There is no sensory  loss.  There is no evidence of any hemiparesis or gaze deviations.  There is no nuchal rigidity.  PSYCHIATRIC:  The patient seems slightly depressed.  He is lying on his  side.  Does not make good eye contact; however, with significant effort,  he does cooperate and is able to have a conversation.   LABORATORY AND TESTS:  1. Chest x-ray, cardiomegaly with AICD place.  No CHF.  2. CT of head sinusitis, chronic.   LABORATORY DATA:  WBCs 4.1, hemoglobin 13.5, platelets 243, and  hematocrit 41.3, notably 10% eosinophils.  Sodium 139, potassium 3.9,  chloride 107, bicarb 26, BUN 11, creatinine 1.03, glucose 101, and anion  gap 8.  Liver function tests were within normal limits.  Urinalysis has  trace leukocyte esterase, 0-2 wbc's.   ASSESSMENT AND IMPRESSION:  This is an 77 year old gentleman with  altered mental status in the setting of dehydration, major depressive  disorder and most likely underlying vascular dementia.  There are no  significant metabolic abnormalities on lab testing.  However, clinically  he does appear to be dehydrated.  Orthostatics are pending at the time  of this dictation.  The patient has a trace amount of leukocyte esterase  in his urinalysis; however, there no white blood cells in his urine  microscopic, so I doubt infectious process such as urinary tract  infection contributing to a possible underlying delirium.  Cannot be  certain that he does not have occult disease, but he does not have  anything that looks like infection.  He does have an unusual history of  1 seizure in 2007 evaluated by Dr. Sandria Manly, but no significant clinical  signs of seizure currently or per the history by family.  This is likely  combination of his major depression with poor p.o. intake and likely  failure to thrive.  He is on quite a few antipsychotic medications,  which may be potentiating the sedative  effects, although there had been  no recent medication changes.  The family is responsible for giving his  medications, so I doubt there is any intentional overdose or  inappropriate medication dosing indicating  toxicity.   PLAN:  We will cautiously hydrate him with fluid boluses, given his  congestive heart failure.  We will check orthostatics.  We will hold his  Risperdal and Ativan and make them p.r.n. only.  We will continue his  SSRIs except  the mirtazapine, which can be sedating.  We will need psych med  evaluation.  I will put him on fall precautions, PAS hose and Lovenox  for DVT prophylaxis.  We will check a 12-lead EKG and 1 set of cardiac  enzymes, given his cardiac history.  Also, check reversible causes of  dementia.  Check B12, RPR, and TSH.      Edsel Petrin, D.O.  Electronically Signed      Eduard Clos, MD  Electronically Signed    ELG/MEDQ  D:  03/16/2009  T:  03/17/2009  Job:  [id number]

## 2011-05-08 NOTE — H&P (Signed)
NAME:  Kevin [name], Kevin [name]                  ACCOUNT NO.:  [id number]   MEDICAL RECORD NO.:  [id number]          PATIENT TYPE:  INP   LOCATION:  1830                         FACILITY:  [facility]   PHYSICIAN:  Manus Gunning, MD      DATE OF BIRTH:  [birth year]   DATE OF ADMISSION:  05/25/2009  DATE OF DISCHARGE:                              HISTORY & PHYSICAL   ADMITTING SERVICE:  Hospitalist Service.   CHIEF COMPLAINT:  Altered mental status.   HISTORY OF PRESENT ILLNESS:  Please note, history has been provided  essentially from electronic medical record and ED sign out.  The patient  was brought to the emergency department by his family secondary to  altered mental status and difficult to arouse.  Apparently according to  the notes and ED sign out, his wife and daughter had brought in him.  They left the house approximately for 3 hours.  Prior to leaving, the  patient was alert and oriented and at his baseline and when they  returned the patient was asleep and could not be aroused.  According to  the family, he has been depressed and has been predominantly staying in  his room for the past day and he has been hoarding his pills and has  tried to overdose in the past, at least 6 times before.  At the time of  my interview, he did say that he was feeling bad and that he was  depressed secondary to being debilitated.  He claims that he can walk,  but he is not able to do much else.  He was asleep when I walked in.  He  aroused easily.  He was oriented to person, disoriented to time and  disoriented to place, though only mildly off, he thought he was at  [employer] instead of [employer].  He thought the year was 2001, but  when I corrected him, he did have realization.  According to him, he  claims that he just has not been feeling well and that he did take extra  pills, though he claims that he has not attempted to hurt himself.  No  family members are at the bedside and the patient himself is  very  difficult to understand, one I believe to be secondary to somnolence and  one I believe secondary to slurring of speech which I believe is also  relatively baseline for the patient.  According to review of the  electronic medical record, this has occurred in the past before and he  has required a psychiatric evaluation and recommendation at that time to  be admitted to a geriatric home, though the family felt that they did  not want to pursue this.  During that admission, it was attributed his  altered mental status to be due to medication ingestion as well.   PAST MEDICAL/SURGICAL HISTORY:  1. Non-ischemic dilated cardiomyopathy with EF of 25-30%.  2. AICD, followed by Dr. Alanda Amass.  3. History of seizure disorder, followed by Dr. Sandria Manly.  4. History of psychotic depression.  5. History of OSA with CPAP  use, although the patient claims he does      not use it.  6. History of hypertension.  7. History of left bundle branch block.  8. History of bilateral knee repair in 2001.  9. History of herniorrhaphy in 2001.   SOCIAL HISTORY:  No history of tobacco or alcohol.  Lives at home with  his wife and daughter.   FAMILY HISTORY:  Brother has a history of diabetes mellitus type 2.  Father and mother both had cancer, unspecified kind.   ALLERGIES:  MORPHINE.   HOME MEDICATIONS:  According to the pills that were lying at the  patient's bedside they are as follows;  1. Aspirin 81 mg p.o. daily.  2. Carvedilol 25 mg twice a day.  3. Clonazepam 0.5 mg three times a day.  4. Fluoxetine 20 mg daily.  5. Benazepril 20 mg daily.  6. Lamotrigine 50 mg at bedtime.  7. Zyprexa 5 mg daily.   REVIEW OF SYSTEMS:  Essentially a 14-point review of systems attempted,  unable to be obtained secondary to patient's condition.  No family  members were at the bedside.  Essentially all history as described above  obtained from ED sign out and electronic medical record.   PHYSICAL EXAMINATION:   VITAL SIGNS:  At the time of presentation are as  follows; temperature of 98, heart rate of 70, respiratory rate 20, blood  pressure 138/89.  O2 saturation 98% on room air.  GENERAL:  A well-nourished, well-developed Caucasian male who is asleep,  easily arousable in no apparent distress.  HEENT:  Normocephalic, atraumatic.  Dry oral mucosa.  No thrush,  erythema or postnasal drip.  Eyes anicteric.  Extraocular movements are  intact.  Pupils are equal and reactive to light and accommodation.  CARDIOVASCULAR:  S1-S2 normal.  Positive S4.  No murmurs, rubs or  gallops.  RS:  Air entry is bilaterally equal.  No rales, rhonchi or wheezes  appreciated.  ABDOMEN:  Soft, nontender, nondistended.  Positive bowel sounds.  No  organomegaly.  EXTREMITIES:  No clubbing, cyanosis or edema.  Positive bilateral  dorsalis pedis.  CNS:  The patient is asleep, easily arousable.  Oriented to person,  disoriented to time and place.  Moves all four extremities  independently.  Follows all commands.  Power, sensation and reflexes  bilaterally symmetrical.  Babinski bilaterally downgoing.  SKIN:  No breakdown, swelling, ulcerations or masses.  HEM/ONC:  No palpable lymphadenopathy, ecchymosis, bruising or  petechiae.  NECK:  Supple.  Good range of motion.  No thyromegaly, no carotid bruit.  Neck veins appear to be flat.   LABORATORY TESTS:  Tricyclics are not detected.  Urine drug screen is  positive for benzos, otherwise negative.  Urinalysis is negative.  Nitrite negative, leukocytes negative, glucose, bilirubin, ketones and  blood, as well as protein.  Salicylate is less than 4.  Alcohol less  than 5.  Sodium 139, potassium 3.9, chloride 108, bicarb 28, glucose 88,  BUN 10, creatinine 0.97, AST is 20, ALT 13, alk phos is 65, T bili 0.7,  albumin is 3.2, total protein 5.9.  Acetaminophen level less than 10.  White blood cell count 4400, hemoglobin 12.3, hematocrit 36.1, platelet  count 227, polymorphs  48.   CT scan of the head without contrast demonstrating no acute intracranial  abnormalities, chronic small vessel ischemic disease and chronic  maxillary sinus inflammation.   DIAGNOSTIC TEST:  Cardiomegaly with AICD, no congestive heart failure  and no active pulmonary disease.  ASSESSMENT/PLAN:  1. Altered mental status.  I believe this to be secondary to      medication overdose.  Whether this was unintentional or intentional      is unknown at this time.  Regardless, we will admit the patient and      place him on a 1:1 sitter.  Hold all mood altering medications at      this time and obtain a psychiatric evaluation in the morning.  Due      to the patient's congestive heart failure, we will start normal      saline at 60 mL an hour x1 liter, then KVO.  Repeat chest x-ray in      the morning as well.  Rule out congestive heart failure.  2. Gastrointestinal and deep venous thrombosis prophylaxis.  Heparin      5000 units subcu q.8 and Protonix 40 mg      p.o. daily.  3. The patient is to be on a heart-healthy diet, as well as strict ins      and outs, daily weights as well and fluid restriction to 1.5 liters      per day.      Manus Gunning, MD  Electronically Signed     SP/MEDQ  D:  05/25/2009  T:  05/25/2009  Job:  [id number]

## 2011-05-08 NOTE — Consult Note (Signed)
NAME:  MARIE, CHOW                  ACCOUNT NO.:  [id number]   MEDICAL RECORD NO.:  [id number]          PATIENT TYPE:  INP   LOCATION:  5528                         FACILITY:  [facility]   PHYSICIAN:  Antonietta Breach, M.D.  DATE OF BIRTH:  [birth year]   DATE OF CONSULTATION:  03/21/2009  DATE OF DISCHARGE:  03/21/2009                                 CONSULTATION   Mr. Quinby has greatly improved.  His nursing staff reports that he is  consistently cooperative.  When he has taken walks down the hallway with  the staff, he is easily redirectable.  He has not been having any  hallucinations or delusions.   He has not been expressing any thoughts of harming himself.  He has not  been expressing any thoughts of harming others.  He has developed  constructive interests and future goals.   He allows the undersigned to speak with his daughter and wife.  They  both provide the same report that Mr. Piscopo has improved as described  above.   Mr. Carnero has been requesting to go home.  His family has made him an  appointment with his outpatient psychiatrist this week given that he has  made the improvement described above.   REVIEW OF SYSTEMS:  SKIN:  No rash with the Lamictal.   PHYSICAL EXAMINATION:  VITAL SIGNS:  Temperature 98.6, pulse 67,  respiratory rate 22, blood pressure 140/91.  O2 saturation on room air  94%.   MENTAL STATUS EXAM:  Mr. Heuring is alert.  His eye contact is good.  His  affect is broad and appropriate.  Mood is within normal limits.  He is  oriented to all spheres.  His memory is intact to immediate, recent and  remote.  His speech involves no pressure.  Thought process is logical,  coherent, goal-directed.  No looseness of associations.  Thought  content; no thoughts of harming himself or others, no delusions or  hallucinations.   Insight is intact, judgment is intact.   ASSESSMENT:  Axis I:  293.81, psychotic disorder, not otherwise  specified, now resolved.  The delusion  that he had an sexually  transmitted disease appears to been part of a waxing and waning  delirium.  293.00, delirium not otherwise specified, resolved.  293.83,  mood disorder not otherwise specified, depression, greatly improved.   Mr. Holzmann is not at risk to harm himself or others.  He agrees to call  emergency services immediately for any thoughts of harming himself,  thoughts of harming others or distress.   He declines psychiatric admission and is no longer committable now that  he has recovered.   He wants to be seen by his outpatient psychiatrist and agrees with the  appointment that his family has made for him.   The undersigned also recommended that he have psychotherapy, he agreed.   Would ask the social worker to set Mr. Huettner up with outpatient  psychiatric medication management as well as counseling.   He will continue on his Prozac 20 mg daily.      Antonietta Breach, M.D.  Electronically Signed     JW/MEDQ  D:  03/21/2009  T:  03/21/2009  Job:  [id number]

## 2011-05-08 NOTE — Op Note (Signed)
NAME:  Kevin [name], Kevin [name]                  ACCOUNT NO.:  [id number]   MEDICAL RECORD NO.:  [id number]          PATIENT TYPE:  AMB   LOCATION:  ENDO                         FACILITY:  [facility]   PHYSICIAN:  Georgiana Spinner, M.D.    DATE OF BIRTH:  Feb 19, 1925   DATE OF PROCEDURE:  12/04/2007  DATE OF DISCHARGE:                               OPERATIVE REPORT   PROCEDURE:  Upper endoscopy.   INDICATIONS:  Hemoccult positivity with iron-deficiency anemia.   ANESTHESIA:  Fentanyl 80 mcg, Versed 8 mg.   PROCEDURE:  With the patient mildly sedated in the left lateral  decubitus position, the Pentax videoscopic endoscope was inserted in the  mouth, passed under direct vision through the esophagus which appeared  normal into the stomach.  There was a hiatal hernia, fairly large, but  fundus, body, antrum, duodenal bulb, second portion duodenum were  visualized and appeared normal.  From this point, the endoscope was  slowly withdrawn taking circumferential views of the duodenal mucosa  until the endoscope had been pulled back in the stomach and placed in  retroflexion to view the stomach from below, and once again the hiatal  hernia was seen and photographed.  The endoscope was then straightened  and withdrawn, taking circumferential views of the remaining gastric and  esophageal mucosa.  The patient's vital signs and pulse oximeter  remained stable.  The patient tolerated the procedure well without  apparent complications.   FINDINGS:  Large hiatal hernia, but otherwise an unremarkable  examination.   PLAN:  Proceed to colonoscopy.           ______________________________  Georgiana Spinner, M.D.     GMO/MEDQ  D:  12/04/2007  T:  12/04/2007  Job:  [id number]

## 2011-05-08 NOTE — Op Note (Signed)
NAME:  Kevin [name], Kevin [name]                  ACCOUNT NO.:  [id number]   MEDICAL RECORD NO.:  [id number]          PATIENT TYPE:  AMB   LOCATION:  ENDO                         FACILITY:  [facility]   PHYSICIAN:  Georgiana Spinner, M.D.    DATE OF BIRTH:  [birth year]   DATE OF PROCEDURE:  12/04/2007  DATE OF DISCHARGE:                               OPERATIVE REPORT   PROCEDURE:  Colonoscopy.   INDICATIONS:  Hemoccult positivity, iron-deficiency anemia.   ANESTHESIA:  None further given.   PROCEDURE:  With the patient mildly sedated in the left lateral  decubitus position, the Pentax videoscopic colonoscope was inserted into  the rectum and passed under direct vision with pressure applied and the  patient rolled to his right side.  We were able to reach the base of the  cecum, identified by ileocecal valve and crow's foot of the cecum, which  was photographed.  After exploring the cecum and cleaning it the  endoscope was then withdrawn taking circumferential views the remaining  colonic mucosa as we withdrew all the way to the rectum, stopping only  in the ascending colon where a small polyp approximately 5 mm in size  was seen, photographed and removed using snare cautery technique,  setting of 20/150 blended current.  The polyp was suctioned into the  endoscope and retrieved through a tissue trap.  The endoscope was then,  as noted, withdrawn all the way to the rectum, which appeared normal on  direct, showed hemorrhoids on retroflexed view.  The endoscope was  straightened and withdrawn.  The patient's vital signs and pulse  oximeter remained stable.  The patient tolerated the procedure well  without apparent complications.   FINDINGS:  1. Large internal hemorrhoids.  2. Small polyp at the ascending colon.  3. Otherwise unremarkable examination.   PLAN:  Await biopsy report.  The patient will call me for results and  follow up with me as an outpatient.  Will also proceed to schedule  capsule  endoscopy to evaluate further.           ______________________________  Georgiana Spinner, M.D.     GMO/MEDQ  D:  12/04/2007  T:  12/04/2007  Job:  [id number]

## 2011-05-08 NOTE — Consult Note (Signed)
NAME:  Kevin [name], Kevin [name]                  ACCOUNT NO.:  [id number]   MEDICAL RECORD NO.:  [id number]          PATIENT TYPE:  INP   LOCATION:  5501                         FACILITY:  [facility]   PHYSICIAN:  Antonietta Breach, M.D.  DATE OF BIRTH:  03/28/1925   DATE OF CONSULTATION:  03/18/2009  DATE OF DISCHARGE:                                 CONSULTATION   REASON FOR CONSULTATION:  Psychosis, agitation, depression.   HISTORY OF PRESENT ILLNESS:  Kevin [name] is an 77 year old male, admitted  to the [facility] on March 16, 2009 with altered mental status and  dehydration.   Kevin [name] has been experiencing at least four weeks of depressed mood and  generally poor concentration and poor energy, along with development of  suicidal thoughts.   He has been changed from Effexor 75 mg b.i.d. to Prozac 20 mg daily.  The Effexor is being tapered off and has been decreased to the dosage of  25 mg daily this past week, while the Prozac had been started 20 mg  daily.   Kevin [name] developed severe lethargy to the point of remaining in bed.   He also has had decreased appetite, impaired judgment.  He has  demonstrated waxing and waning confusion.  He has been having delusions,  including this morning, where he states that he has an STD.  He has also  been trying to leave the hospital.  He has moments of clearing, where he  can remember visual objects.  He does have difficulty with word recall  when he is expressing himself verbally.  His verbal reception is better  than expression.   He did present to the hospital on clonazepam 0.5 mg b.i.d., as well as  Risperdal 0.5 mg q.h.s.  With his venlafaxine Prozac switch, he was also  remaining on Remeron 30 mg q.h.s. and Lamictal 50 mg q.h.s.   His daughter confirmed that the Lamictal has been in place for  antidepression augmentation and not seizures.   The admitting physician did discontinue the Remeron.   PAST PSYCHIATRIC HISTORY:  Kevin [name] has been  experiencing severe  depression episodes since he was in his 24s.  He has been admitted in  the past to [facility] geropsychiatric unit.   In review of the past medical record, he was seen by Dr. Sandria Manly in  September 2007.  At that time he was on Effexor 75 mg b.i.d.   His daughter confirms that he has been tried on Zoloft, Lexapro, Paxil,  Serzone.  She has not approved a recommendation for Cymbalta, based upon  her own akathisia from Cymbalta.   FAMILY PSYCHIATRIC HISTORY:  His daughter, who is healthcare power of  attorney, has had severe recurrent major depression as well.  She has  responded well to Prozac at 20 mg daily.  However, she did have an  adverse akathisia with Cymbalta and has told his physicians to not  prescribe Cymbalta because of that reason.   SOCIAL HISTORY:  Kevin [name] is married.  He has been living at home with  his wife.  He was  educated through the 8th grade.  He is retired from  Orthoptist work.  He has also been living with his daughter.  He does not  use alcohol or illegal drugs.   PAST MEDICAL HISTORY:  He has nonischemic cardiomyopathy.  There was an  AICD placed in May 2007.  He does have a history of seizure disorder,  which had occurred while he was on Wellbutrin.   Sleep apnea, using CPAP, hypertension and left bundle branch block.  His  QTC currently on EKG is 453 milliseconds.   History of bilateral knee surgery, history of left shoulder surgery,  hernia repair.   MEDICATIONS:  His M A R is reviewed.  1. He is on Klonopin 0.5 mg twice a day  2. Venlafaxine 25 mg daily.  3. Risperdal 0.5 mg q.h.s.  4. Lamictal 50 mg q.h.s.  5. Prozac 20 mg daily.   LABORATORY DATA:  B12 normal.  TSH normal.  RPR nonreactive.  Drug  screen was positive for benzodiazepines, consistent with his  prescription of Klonopin.  Sodium 139, BUN 11, creatinine 1.03.  Glucose  101.  WBC 4.1, hemoglobin 13.5, platelet count 243.   Head CT without contrast showed mild  atrophy without change from  previous view in 2007.  He had no acute or focal intracranial  abnormality.   REVIEW OF SYSTEMS:  Constitutional, head, eyes, ears, nose and throat,  mouth neurologic, psychiatric, cardiovascular, respiratory,  gastrointestinal, genitourinary, skin, musculoskeletal, hematologic,  lymphatic, endocrine, metabolic all unremarkable.   EXAMINATION:  VITAL SIGNS:  Temperature is 97.3, pulse 97, respiratory  rate 20, blood pressure 173/94, O2 saturation on room air 95%.  GENERAL APPEARANCE:  Kevin [name] is an elderly male, appearing  approximately 10 years younger than his chronologic age.  He gets up and  walks around the room often.  He does not have any abnormal involuntary  movements.   MENTAL STATUS EXAM:  Kevin [name] does interrupt often and has difficulty  with instruction to listen.  His affect is mildly agitated.  He does  have distractibility.  His eye contact is intact.  His concentration is  moderately decreased.  His mood is anxious.  On orientation questioning,  he does know the month and the year.  He misses the day of the month.  He does know where he is.  He is also oriented to person.  On memory  testing, 3/3 visual objects immediate, 2/3 on recall.  His fund of  knowledge and intelligence are mildly below that of his estimated  baseline.  His speech does involve some blocking.  There is some slight  dysarthria.  Thought process does involve blocking.  Also there is some  word-finding difficulty and some occasional confabulation.  Thought  content:  Please see the history of present illness.  He does have some  blocking at the time of the exam.  His insight is poor.  His judgment is  impaired.   ASSESSMENT:  AXIS I:  1. 293.81  psychotic disorder, not otherwise specified.  2. 293.00 delirium, not otherwise specified.  There are some delirium      symptoms in the recent history, including waxing and waning      lethargy with agitation.  He has  greater psychosis in the morning.      He has had some mild difficulty with orientation, as well as some      thought disorganization and decreased attention.  However, these      are mild  to moderate symptoms, other than the psychosis.  3. 293.83 mood disorder, not otherwise specified (idiopathic and      general medical factors), depressed.  Rule out major depressive      disorder, recurrent, with psychotic features.  AXIS II:  None.  AXIS III:  See past medical history.  AXIS IV:  General medical.  AXIS V:  20.   Kevin [name] demonstrate impulsivity.  He is an elopement risk.  Also he  demonstrates risk for suicide in that he has been having suicidal  thoughts and demonstrates impulsivity.  He has the ability to ambulate  and does have periods of waxing and waning agitation.   Therefore, he requires an environment where he is supervised until his  psychotic mood symptoms and impulsivity can be controlled.   RECOMMENDATIONS:  1. Would provide a sitter for suicide and elopement precautions.  2. The indications, alternatives and adverse effects of the following      were discussed with the patient's daughter and wife:  Prozac,      Zyprexa, Atacand and Lamictal.  She understands and wants to      proceed as below.  3. Would continue the Prozac 20 mg daily as the new primary      antidepressant trial.  4. Would proceed with Zyprexa, given the QTC, at 5 mg p.o. or IM q.      1800 for anti-psychosis and also as an augmenter for the Prozac.  5. Will not change level of Lamictal, given that he has been seen by      neurology and does have a history of seizure.  The Lamictal can be      changed or tapered off later if it is determined that it is not      needed.  6. Would discontinue the Risperdal, given the QTC.  7. The Effexor can be discontinued  8. Would discontinue the clonazepam.  9. Would utilize Ativan 0.5 to 2 mg p.o. IM or IV q.4 h p.r.n.      anxiety, agitation or nocturnal  insomnia.  10.Would continue to keep memory and orientation cues in the room.  11.Would utilize low-stimulation ego support.  12.A geropsychiatric unit would be the best place of hospitalization      for this patient.  13.The undersigned did provide extensive education for the daughter.  14.Would monitor for stiffness or other extrapyramidal side effects as      the Zyprexa is used.      Antonietta Breach, M.D.  Electronically Signed     JW/MEDQ  D:  03/18/2009  T:  03/18/2009  Job:  [id number]

## 2011-05-08 NOTE — H&P (Signed)
NAME:  Kevin [name], Kevin [name]                  ACCOUNT NO.:  [id number]   MEDICAL RECORD NO.:  [id number]          PATIENT TYPE:  INP   LOCATION:  1828                         FACILITY:  [facility]   PHYSICIAN:  Elliot Cousin, M.D.    DATE OF BIRTH:  Jan 12, 1925   DATE OF ADMISSION:  09/04/2008  DATE OF DISCHARGE:                              HISTORY & PHYSICAL   PRIMARY CARE PHYSICIAN:  Soyla Murphy. Renne Crigler, M.D.   PRIMARY CARDIOLOGIST:  Richard A. Alanda Amass, M.D.   CHIEF COMPLAINT:  Dizziness and generalized weakness.  Reported suicidal  ideation by family.   HISTORY OF PRESENT ILLNESS:  The patient is an 77 year old man with a  past medical history significant for nonischemic cardiomyopathy, chronic  left bundle branch block, hypertension, and depression.  He presents to  the emergency department with a chief complaint of generalized weakness  and dizziness.  The patient states that he has become progressively weak  and dizzy over the past 3-4 days.  He has not fallen.  When he becomes  dizzy he would quickly sit down or lie down.  He has had no complete  blackouts or loss of consciousness.  He denies headache, chest pain,  fever, chills, nausea, vomiting, diarrhea and upper respiratory  infection symptoms.  He says that his dizziness is usually a  lightheadedness and occurs primarily when he stands up and when he walks  a lot.  He also complains of generalized fatigue and generalized  weakness.  He says that he just gives out when he starts to walk. He  seems to more short of breath with activity  He has had no cough, no  increase in swelling of his legs, and no pleurisy.  The only new  medication he started over the past few weeks has been Aricept which she  started approximately 5 weeks ago.   The patient's family including his wife, Kathie Rhodes and his daughter, [name]  along with her husband report that the patient has been depressed over  the past few weeks.  He appears to be depressed about his  overall  general health.  The patient has stated that he no longer wants to live  and/or his life is not living anymore.  The family has been concerned  about his comments given that he has a history of attempted suicide in  2002.  At times, according to his family, he would lay in the bed 3, 4  or 5 days at a time.  They acknowledge that after he has been virtually  sedentary, when he starts to get up to move around, he does become dizzy  and short of breath.  They have not noticed any signs or symptoms  consistent with an infection or stroke.  The patient's cardiologist, Dr.  Alanda Amass suggested that it was time for him to start Aricept, although  the patient was not necessarily diagnosed with Alzheimer's dementia.  The patient had been evaluated by Dr. Sandria Manly in the past, and it was  suggested that Aricept may help with his memory.  The patient is treated  chronically with  Effexor for depression.   When I questioned the patient about suicidal ideation, he initially  denied it.  However, when I disclosed that I had spoken with his family  (which he gave me permission to do so), he acknowledged that he just did  not want to live anymore.  When he was questioned about a plan, he  answered that he did not have a plan or even thought about how he would  want to kill himself.  The patient was ambulated by the nursing staff in  the emergency department and he was found to be a little unsteady, but  otherwise, had no focal neurological deficits with ambulation.  Currently, the patient is hemodynamically stable and afebrile.  His  urinalysis is unremarkable.  His lab data are unremarkable.  The CT scan  of the head that was ordered by the emergency department physician  revealed no acute abnormalities.  The patient will be admitted for  further evaluation and management.   PAST MEDICAL HISTORY:  1. Nonischemic cardiomyopathy with an ejection fraction estimated to      be 35-45%.  Status post  AICD/pacer in May 2007 by Dr. Alanda Amass.      Normal coronary arteries per cardiac catheterization in 2005.  2. Near syncope secondary to orthostatic hypotension in June 2008.  3. Chronic left bundle branch block.  4. Hypertension.  5. Chronic anemia.  6. Large internal hemorrhoids and colon polyps per colonoscopy in      December 2008.  7. Hiatal hernia per EGD in December 2008.  8. History of sleep apnea.  9. Benign prostatic hypertrophy.  10.Depression with a history of suicide attempt in 2002.  The patient      was admitted to behavioral health on three different occasions in      2002.   MEDICATIONS:  1. Lotensin 20 mg daily.  2. Effexor 75 mg b.i.d.  3. Aspirin 81 mg daily.  4. Coreg 12.5 mg b.i.d.  5. Aricept 10 mg daily.  6. Triamterene HCTZ 37.5/25 mg daily.   ALLERGIES:  NO KNOWN DRUG ALLERGIES.   SOCIAL HISTORY:  The patient is married.  He lives [place] his wife [place]  He has one daughter, Webb Silversmith.  The  patient is retired.  He stopped drinking many years ago.  He does not  smoke.  He still drives occasionally.   FAMILY HISTORY:  The patient's family history is positive for cancer and  hypertension.   REVIEW OF SYSTEMS:  As above in history present illness.   PHYSICAL EXAMINATION:  VITAL SIGNS:  Temperature 98.3, blood pressure  136/72, pulse 76, respiratory rate 18, oxygen saturation 96% on room  air.  GENERAL:  The patient is a pleasant, alert, 77 year old Caucasian man  who is currently lying in bed, in no acute distress.  HEENT:  Head is normocephalic nontraumatic.  Pupils equal, round,  reactive to light.  Extraocular muscles are intact.  Conjunctivae are  clear.  Sclerae are white.  Nasal mucosa is moist.  No sinus tenderness.  Oropharynx reveals mildly dry mucous membranes.  No posterior exudates  or erythema.  NECK:  Supple.  No adenopathy, no thyromegaly, no bruit or JVD.  LUNGS:  Clear to auscultation bilaterally.   HEART:  S1-S2 with a soft systolic murmur.  Chest wall with a palpable  AICD/pacer at the left upper chest.  Nontender.  ABDOMEN:  Positive bowel sounds, soft, nontender, nondistended.  No  hepatosplenomegaly, no masses palpated.  EXTREMITIES:  Pedal pulses  barely palpable.  No pretibial edema and no pedal edema.  Rectal and GU  were deferred.  NEUROLOGIC:  The patient is alert and oriented x3.  Cranial nerves II-XII are intact.  Strength is 5/5 throughout.  Sensation is intact.  PSYCHOLOGICAL:  The patient has a pleasant affect.  However, he does  admit to chronic fatigue, chronic sleepiness, and a generalized feeling  of sadness.  The patient voices that he no longer wants to live because  of his chronic medical conditions.  He does not want to be a burden to  his wife or family.  He denies any plan for trying to kill himself.   ADMISSION LABORATORIES:  EKG reveals pacemaker spikes, sinus rhythm with  fusion complexes, left ventricular hypertrophy, heart rate 75 beats per  minute.  Chest x-ray reveals no acute cardiopulmonary disease.  CT scan  of the head reveals no acute intracranial hemorrhage or edema.  CK-MB  4.9, Troponin I less than 0.05, myoglobin 109.  Digoxin level less than  0.2.  Sodium 137, potassium 4.2, chloride 102, CO2 30, glucose 88, BUN  13, creatinine 0.93, total bilirubin 0.7, alkaline phosphatase 89, SGOT  27, SGPT 17, total protein 6.8, albumin 3.7, calcium 9.8, PTT 27.  PT  13.2, WBC 6.0, hemoglobin 15.2, platelets 287.  Urinalysis negative.   ASSESSMENT:  1. Generalized dizziness and generalized weakness.  The patient has no      significant neurological findings on exam.  Orthostatic vital signs      were obtained, and according to the results, the patient is not      orthostatic.  The generalized dizziness may be a consequence of      orthostatic changes at home given that his family reported that he      is virtually sedentary.  The generalized weakness  may be a      consequence of progressive senile decline associated with      generalized depression and poor cardiac function.  2. Depression with suicidal ideation.  The patient has a history of      multiple psychiatric admissions to [facility] in 2002.  He      attempted suicide at least once in 2002.  The patient is being      treated with Effexor for depression.  He was recently started on      Aricept although his family does not clearly state that he was      diagnosed with Alzheimer's dementia.  3. Nonischemic cardiomyopathy with an ejection fraction of 35% and      status post AICD/pacer.  The patient's cardiologist is Dr. Susa Griffins.  Currently, the patient does not appear to be in acute      congestive heart failure.   PLAN:  1. The patient will be admitted for further evaluation and management.  2. For further evaluation, will check cardiac enzymes to rule out      myocardial infarction, TSH to rule out thyroid disease, and vitamin      B12 to rule out deficiency.  3. Will check carotid Dopplers to assess for carotid artery disease.  4. Will continue the patient's chronic medications.  5. Suicide precautions with a 24 hour sitter will be initiated.  Will      need to consult psychiatry tomorrow.  6. Supportive treatment.  7. Will check orthostatics vitals each morning x2.  8. Will consult the physical and occupational  therapists.  9. Gentle IV fluids.  Will hold triamterene HCTZ for 24 hours and then      restart accordingly.      Elliot Cousin, M.D.  Electronically Signed     DF/MEDQ  D:  09/04/2008  T:  09/05/2008  Job:  [id number]   cc:   Soyla Murphy. Renne Crigler, M.D.  Richard A. Alanda Amass, M.D.
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
## 2011-05-08 NOTE — H&P (Signed)
NAME:  Kevin [name], Kevin [name]                  ACCOUNT NO.:  [id number]   MEDICAL RECORD NO.:  [id number]          PATIENT TYPE:  INP   LOCATION:  1856                         FACILITY:  [facility]   PHYSICIAN:  Della Goo, M.D. DATE OF BIRTH:  [birth year]   DATE OF ADMISSION:  06/03/2007  DATE OF DISCHARGE:                              HISTORY & PHYSICAL   PRIMARY CARE PHYSICIAN:  Soyla Murphy. Renne Crigler, M.D.   CHIEF COMPLAINT:  Almost passed out.   HISTORY OF PRESENT ILLNESS:  This is a 77 year old male with multiple  medical problems who presents to the emergency department secondary to  complaints of nearly passing out twice this afternoon.  At 2:00 p.m. he  reports having difficulty walking, feeling dizzy and almost passing out  for the first time.  He reports falling.  Later in the afternoon, he  reports being with his family and going to the drug store and then  having another episode of nearly passing out.  He reports having  dizziness.  Denies having any nausea, vomiting or chest pain associated  with this.  The patient denies having any nausea, vomiting, diarrhea  symptoms.   The patient has been under treatment by the urologist, Dr. Vonita Moss, for  a prostatitis and has been taking Bactrim therapy twice a day for 15  days and Pyridium.   PAST MEDICAL HISTORY:  1. Hypertension.  2. Benign prostatic hypertrophy.  3. Nonischemic cardiomyopathy.  4. Left bundle branch block.  5. Sleep apnea.  6. Status post defibrillator/pacer.   PAST SURGICAL HISTORY:  1. History of a left total knee replacement.  2. History of back surgery.  3. History of left shoulder surgery.   MEDICATIONS:  1. Lotensin 20 mg two p.o. daily.,  2. Effexor 75 mg two p.o. q.a.m. and one p.o. q.p.m.  3. Aspirin 81 mg one p.o. daily.  4. Potassium chloride 20 mEq one p.o. daily.  5. Lasix 40 mg one p.o. b.i.d.  6. Ativan 0.5 mg one tablet p.o. t.i.d.  7. Carvedilol 6.25 mg two p.o. q.a.m. and one p.o. q.p.m.   SOCIAL HISTORY:  The patient is married, nonsmoker, nondrinker.   FAMILY HISTORY:  Positive for hypertension in his mother, positive for  cancer in a brother who had bone cancer.  Negative for coronary artery  disease.  Negative for diabetes.   REVIEW OF SYSTEMS:  Mentioned above.   PHYSICAL EXAMINATION:  GENERAL APPEARANCE:  This is a 77 year old well-  nourished, well-developed male, mildly confused but in no acute  distress.  VITAL SIGNS:  Temperature 97.1, blood pressure 116/71, heart rate 100,  respirations 24, O2 sats 97%.  HEENT:  Normocephalic, atraumatic.  Pupils equally round, reactive to  light.  Extraocular muscles are intact.  Funduscopic benign.  Oropharynx  is clear.  NECK:  Supple, full range of motion.  No thyromegaly, adenopathy or  jugular venous distension.  CARDIOVASCULAR:  Regular rate and rhythm.  No murmurs, gallops or rubs.  LUNGS:  Clear to auscultation bilaterally.  ABDOMEN:  Positive bowel sounds, soft, nontender, nondistended.  EXTREMITIES:  Without cyanosis, clubbing  or edema.  NEUROLOGIC:  The patient is alert and oriented x3.  There are no focal  deficits.   LABORATORY STUDIES:  White blood cell count 6.1, hemoglobin 11.2,  hematocrit 34.5, MCV 75.6, platelets 370, neutrophils 51%, lymphocytes  32%.  Sodium 139, potassium 3.4, chloride 104, bicarb 29.6, BUN 15,  creatinine 1.2 and glucose 165.  Protime 13.3, INR 1.   CHEST X-RAY:  No acute disease process.   ASSESSMENT:  A 77 year old male being admitted with  1. Presyncope.  2. Microcytic anemia.  3. Mild hypokalemia.  4. History of nonischemic cardiomyopathy.   PLAN:  The patient will be admitted to telemetry area for cardiac  monitoring.  Cardiac enzymes will be performed q.8h. x3.  Orthostatic  vital signs will also be performed q.a.m.  The patient will be hydrated  with IV fluids and have potassium replacement therapy.  His medications  have been verified with his wife by the telephone.   A carotid ultrasound  study has been ordered.  An MRI, MRA cannot be done secondary to the  patient's defibrillator/pacemaker.  Consideration will be given to  ordering CAT scan of the head.  DVT and GI prophylaxis have been ordered  as well.      Della Goo, M.D.  Electronically Signed     HJ/MEDQ  D:  06/03/2007  T:  06/04/2007  Job:  [id number]   cc:   Soyla Murphy. Renne Crigler, M.D.

## 2011-05-08 NOTE — Consult Note (Signed)
NAME:  Kevin [name], Kevin [name]                  ACCOUNT NO.:  [id number]   MEDICAL RECORD NO.:  [id number]          PATIENT TYPE:  INP   LOCATION:  3021                         FACILITY:  [facility]   PHYSICIAN:  Antonietta Breach, M.D.  DATE OF BIRTH:  1925-08-04   DATE OF CONSULTATION:  05/27/2009  DATE OF DISCHARGE:                                 CONSULTATION   REQUESTING PHYSICIAN:  [facility].   REASON FOR CONSULTATION:  Drug overdose.   HISTORY OF PRESENT ILLNESS:  Mr. Kaycee Rabel is an 77 year old male  admitted to [facility] May [facility], 2009, due to acute mental  status changes and a drug overdose.   Mr. Lofton acknowledges that he has been experiencing depressed mood.  He  has been ruminating over a sexual encounter that he had a number of  months ago.  He states that he developed a red rash that occurs in the  morning and a pattern that occurs on both hands.  He states by the  afternoon that this syndrome goes away.  He continues with the belief  that he has contracted a sexually transmitted disease.   His description of the history of encountering the prostitute sounds  accurate.  He has been having guilt regarding the encounter.   He does fully acknowledge suicidal intent and he did overdose with  medication in an attempt to kill himself on the day of admission.   On the day of admission, his wife and daughter found him unarousable on  the bed after the overdose.   He does have a past psychiatric history.   PAST PSYCHIATRIC HISTORY:  Mr. Keenum has been assessed to have delusions  and was treated with Risperdal earlier in the year.  He also was  suffering from major depression and was tried on Effexor.  This was then  switched to Prozac.   He was also placed on Lamictal for anti-depression augmentation.   FAMILY PSYCHIATRIC HISTORY:  None known.   SOCIAL HISTORY:  Mr. Lia was educated through the 8th grade.  He was a  Insurance claims handler.  He is married and lives at home  with his wife and  daughter.  He does not use alcohol or illegal drugs.   PAST MEDICAL HISTORY:  Status post overdose.  He does have a history of  an AICD implant and coronary artery disease.   MEDICATIONS:  MAR is reviewed.  He is ALLERGIC TO MORPHINE SULFATE.  His  current psychotropic medications include Lamictal 50 mg daily.   LABORATORY DATA:  TSH, phosphorus, aspirin, Tylenol, and alcohol all  unremarkable.   SGOT 21, SGPT 9.  Urine drug screen positive for benzodiazepines.   Head CT without contrast unremarkable.   REVIEW OF SYSTEMS:  CONSTITUTIONAL, HEAD, EYES, EARS, NOSE, AND THROAT,  MOUTH, NEUROLOGIC, PSYCHIATRIC CARDIOVASCULAR, RESPIRATORY,  GASTROINTESTINAL, GENITOURINARY, SKIN, MUSCULOSKELETAL, HEMATOLOGIC,  LYMPHATIC, ENDOCRINE, METABOLIC all unremarkable.   EXAMINATION:  GENERAL APPEARANCE:  Mr. Tubby is an elderly male lying in  a supine position in his hospital bed with no abnormal involuntary  movements.  MENTAL STATUS EXAM:  Mr. Steinert has intact eye contact.  He is alert.  His  affect is constricted.  His mood is anxious.  His concentration is  mildly decreased.  He is oriented to all spheres.  His memory is intact  to immediate, recent, and remote.  His fund of knowledge and  intelligence are normal.  His speech involves normal rate and prosody  without dysarthria.  Thought process is coherent; however, __________ is  present.  Thought content:  He acknowledges suicidal intent.  He does  appear to be having delusions.  His insight is poor.  His judgment is  impaired.   ASSESSMENT:  AXIS I.  293.81  Psychotic disorder not otherwise  specified.  293.83  Mood disorder not otherwise specified.  Major  depressive disorder recurrent severe.  AXIS II:  Deferred.  AXIS III:  See past medical history.  AXIS IV:  Primary support group, general medical.  AXIS V:  20.   Mr. Frankowski is at risk for suicide.   He also likely is suffering from psychosis.   I would  therefore admit Mr. Brashears to an inpatient psychiatric unit for  further evaluation and treatment.   Regarding the complaint of rash and the fact that he is on Lamictal,  would not discontinue the Lamictal unless a rash is present given that  Lamictal may be required for his anti-depression regimen.   Lamictal is not first line in augmenting for anti-depression.  However,  he does have a history of multiple psychotropic trials for depression.   Would continue ego supportive psychotherapy.      Antonietta Breach, M.D.  Electronically Signed     JW/MEDQ  D:  05/27/2009  T:  05/27/2009  Job:  [id number]
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
## 2011-05-08 NOTE — Discharge Summary (Signed)
NAME:  Kevin [name], Kevin [name]                  ACCOUNT NO.:  [id number]   MEDICAL RECORD NO.:  [id number]          PATIENT TYPE:  INP   LOCATION:  4711                         FACILITY:  [facility]   PHYSICIAN:  Richard A. Alanda Amass, M.D.DATE OF BIRTH:  1925/03/16   DATE OF ADMISSION:  06/03/2007  DATE OF DISCHARGE:  06/10/2007                               DISCHARGE SUMMARY   DISCHARGE DIAGNOSES:  1. Near syncope.  2. Orthostatic hypotension.  3. Anemia.  4. Urinary tract infection/prostatitis.  5. Chronic constipation.  6. Spivey automatic implantable cardioverter defibrillator.  7. Nonischemic cardiomyopathy with ejection fraction of 35% to 45%.  8. Chronic left bundle branch block.  9. Sleep apnea.  10.Hypertension.   BRIEF HISTORY:  Mr. Lupton is a 77 year old male with multiple medical  problems who presented to [facility] Emergency Department on June 03, 2007, after experiencing 2 episodes of near syncope.  This is  accompanied by dizziness.  No chest pain, nausea, vomiting, or  diaphoresis.  Please see dictated history and physical for further  details.   HOSPITAL COURSE:  Mr. Giannotti was admitted to 4700 in stable condition  given his history of near syncope.  An MRA/MRI of the brain was ordered  as well as orthostatic vital signs and anemia workup.  His ICD was also  interrogated revealing no VT or VS episodes recording with normal  thresholds noted.  It was noted that on orthostatic vital signs that he  was orthostatic on admission with a 20 to 25 mmHg drop in his blood  pressure.  He was hydrated and medications had recently been titrated  down by Dr. Alanda Amass.  Because of his orthostasis, it was felt best to  try midodrine to help with orthostatic hypertension.  He was started on  this and tolerated without difficulty.  He has ambulated in the hall  without recurrent episodes of dizziness.  During his hospitalization, he  also complained of ureteral burning and dysuria.  He was on  Rocephin as  well as Bactrim DS along with peridium.  He reported that these  medications made his symptoms worse and was much more uncomfortable.  He  was also given Rocephin.  These medications were discontinued and he was  started on Cipro 250 mg b.i.d. as well as peridium and he seemed to  tolerate that much better.  A GU consult was obtained.  The patient does  see Dr. Vonita Moss and he was seen by Dr. Retta Diones, in consultation  regarding the dysuria and painful urination.  It was also thought that  his penile pain may be psychogenic.  His urinalysis was sent and  cultures obtained.  No growth was noted with those.  He does have a  history of chronic constipation and GI consult was obtained.  Concern  was that his rectal pain may be secondary to prostatitis.  He was  treated with Doculax and MiraLax for his constipation with positive  results and also underwent Flechsig this morning, which was negative.  Followup was recommended on an as needed basis.  A CT of the pelvis  was  performed as well, which revealed a small amount of gas distally in the  soft tissue of the penis.  The prostate gland was asymmetric, larger on  the right side.  There were no perirectal abscess or inflammation.  He  was also prescribed Ativan for anxiety, which appeared to help with some  of his symptoms.  He continued with the Proamatine without any untoward  effects and no recurrent syncope.  On the day of discharge, he has no  other complaints.  He is quite anxious for discharge home.   LABORATORY DATA:  Labs today revealed a hemoglobin of 10.3 and  hematocrit of 31.4 with an MCV of 77.3.  Chemistries:  Sodium 138,  potassium 4.4, BUN 8, creatinine 1.03, PT 13.3, INR 1.  BNP 133.   Admission labs revealed a hemoglobin of 11.2, hematocrit of 34.5, sodium  134, potassium 3.6, BUN 9, creatinine 1.  Cardiac markers were negative.  Iron was 31, TIBC 305, % saturation 10, UIBC 274, folate was 16.9, B12  187.   TSH 2.439.   Carotid duplex done June 04, 2007, was negative for significant  stenosis.   DISCHARGE INSTRUCTIONS:  The patient is discharged home and instructed  to follow a low-fat, low-cholesterol, low-sodium diet.  He is to follow  heart failure instructions and a weight chart was provided.  He is to  discontinue any activity that causes him chest pain, shortness of  breath, dizziness, sweatiness, or excessive weakness.  He is to record  his daily weight and review these special instructions as listed per  congestive heart failure recommendations.  He has a followup  appointment, which will be scheduled with Dr. Alanda Amass in the next 2  weeks, our office has been contacted and will call him with the  appointment day and time.  I also recommend that he see Dr. Vonita Moss in  the next 1 to 2 weeks with recurrent penile discomfort.   DISCHARGE MEDICATIONS:  1. Protonix 40 mg daily.  2. Effexor 150 mg in the morning, 75 mg each evening.  3. Aspirin 81 mg daily.  4. Coreg 12.5 mg q.a.m., 6.25 mg nightly.  5. Potassium 20 mEq daily.  6. Lotensin 10 mg daily.  7. Cipro 250 mg b.i.d. for the next 10 days.  8. Peridium 200 mg t.i.d. p.r.n.  9. Midodrine 2.5 mg 3 times daily.  10.Iron daily.  11.He is to discontinue his Lasix, digoxin, and doxycycline.     ______________________________  Charmian Muff, NP      Richard A. Alanda Amass, M.D.  Electronically Signed    LS/MEDQ  D:  06/10/2007  T:  06/11/2007  Job:  [id number]   cc:   Maretta Bees. Vonita Moss, M.D.  [facility]  Dr. Juan Quam

## 2011-05-11 NOTE — Procedures (Signed)
EEG NUMBER:  [id number]   HISTORY:  This is a 77 year old patient who is being evaluated for an  episode of unresponsiveness and possible seizure-type event associated with  severe hypotension and hypothermia associated with a possible drug overdose.  The patient is being evaluated for the seizures.   PROTOCOL:  This is a portable EEG recording.  No skull defects are noted.   MEDICATIONS:  Dopamine, Levophed, Versed, Ativan, Amidate, Protonix, sliding-  scale insulin.   EEG CLASSIFICATION:  Essentially normal, awake.   DESCRIPTION OF RECORDING:  The background rhythm of this recording consists  of a somewhat low-amplitude 8-Hz background activity that appears to be  reactive.  As the record progresses, photic stimulation and hyperventilation  were not performed.  There is an overlying beta frequency activity that  seems to be generalized in nature, but more prominent in the frontal regions  than posteriorly, but is persistent throughout the recording.  At no time  during the recording does there appear to be evidence of spike or spike wave  discharges or evidence of focal slowing.  EKG monitor shows fairly regular  rhythm with a rate of 96.   IMPRESSION:  This is an essentially normal EEG recording in the awakened  state.  Overlying beta frequency activity is likely secondary to medication  effect such as from benzodiazepines.  No epileptiform discharges were seen  at any time during the recording.      Marlan Palau, M.D.  Electronically Signed     ZOX:WRUE  D:  08/29/2006 18:02:50  T:  08/30/2006 06:38:16  Job #:  [id number]

## 2011-05-11 NOTE — Op Note (Signed)
NAME:  Kevin [name], Kevin [name]                            ACCOUNT NO.:  [id number]   MEDICAL RECORD NO.:  [id number]                   PATIENT TYPE:  AMB   LOCATION:  NESC                                 FACILITY[facility]  [facility]   PHYSICIAN:  Maretta Bees. Vonita Moss, M.D.             DATE OF BIRTH:  May 23, 1925   DATE OF PROCEDURE:  01/12/2004  DATE OF DISCHARGE:                                 OPERATIVE REPORT   PREOPERATIVE DIAGNOSIS:  Left hydrocele.   POSTOPERATIVE DIAGNOSIS:  Left hydrocelectomy.   SURGEON:  Maretta Bees. Vonita Moss, M.D.   ANESTHESIA:  General.   INDICATIONS FOR PROCEDURE:  This 77 year old gentleman has had progressive  enlargement of a left scrotal mass consistent with a hydrocele and causing  him discomfort.  He wishes this to be removed. He was advised about  postoperative bleeding, bruising and swelling.   DESCRIPTION OF PROCEDURE:  The patient was brought to the operating room and  placed in supine position.  The external genitalia were prepped and draped  in the usual fashion.  A vertical left scrotal incision was made and the  hydrocele delivered in the operative field and the hydrocele sac was opened  and drained of typical clear yellow fluid.  One tiny pearl was washed out  in addition.  The hydrocele sac was resected and then the remaining  hydrocele was sutured behind the testicle with interrupted 3-0 chromic  catgut recurrence.  The testicle was placed back in the scrotum and the  wound was closed with two layers of running 3-0 chromic catgut. The wound  was cleaned and dressed with dry sterile dressings and he was taken to the  recovery room in good condition with the correct sponge, needle, instrument  count and essentially no blood loss.  He tolerated the procedure well.                                               Maretta Bees. Vonita Moss, M.D.    LJP/MEDQ  D:  01/12/2004  T:  01/12/2004  Job:  [id number]

## 2011-05-11 NOTE — Op Note (Signed)
NAME:  Kevin [name], Kevin [name]                            ACCOUNT NO.:  [id number]   MEDICAL RECORD NO.:  [id number]                   PATIENT TYPE:  AMB   LOCATION:  DSC                                  FACILITY:  [facility]   PHYSICIAN:  Loreta Ave, M.D.              DATE OF BIRTH:  September 24, 1925   DATE OF PROCEDURE:  04/13/2004  DATE OF DISCHARGE:                                 OPERATIVE REPORT   PREOPERATIVE DIAGNOSIS:  Chronic irreparable, symptomatic rotator cuff tear,  left shoulder, distal clavicle degenerative arthritis with subacromial  impingement, malunion distal clavicle fracture.   POSTOPERATIVE DIAGNOSIS:  Chronic irreparable, symptomatic rotator cuff  tear, left shoulder, distal clavicle degenerative arthritis with subacromial  impingement, malunion distal clavicle fracture, with grade 2 and 3  degenerative joint disease, glenohumeral joint, degenerative labral tearing,  complete chronic irreparable tear long head of the biceps tendon.   OPERATIVE PROCEDURE:  Left shoulder exam under anesthesia, arthroscopy,  debridement of biceps tendon stump, labrum, glenohumeral joint, and  irreparable cuff tear, acromioplasty, excision distal clavicle.   SURGEON:  Loreta Ave, M.D.   ASSISTANT:  Arlys John D. Petrarca, P.A.-C.   ANESTHESIA:  General.   ESTIMATED BLOOD LOSS:  Minimal.   SPECIMENS:  None.   CULTURES:  None.   COMPLICATIONS:  None.   DRESSINGS:  Soft compressive with a sling.   PROCEDURE:  The patient was brought to the operating room and placed on the  operating table in supine position.  After adequate anesthesia had been  obtained, he was placed in a beach chair position on the shoulder  positioner, prepped and draped in the usual sterile fashion.  The shoulder  was examined with full motion and good stability with really marked grating  with the humerus articulating with the bottom of the acromion.  After being  prepped and draped, three standard portals,  anterior, posterior, and  lateral.  The shoulder was entered with a blunt obturator, distended, and  inspected.  Chronic irreparable complete tearing rotator cuff, supraspinatus  tendon as well as the top half of the infraspinatus and subscap tendon.  Retracted to the level of the glenoid.  Markedly scarred in atrophic  nonmobile, not repairable.  Debrided back to healthy tissue at about the  level of the glenoid.  Circumferential tearing labrum debrided as well.  Biceps tendon completely absent.  The stump of the biceps still in place was  debrided to healthy tissue.  The glenohumeral joint otherwise cleared of all  chondral debris and chondroplasty of uneven surfaces.  No grade 4 changes.  Type 3 acromion.  Acromioplasty to a type 1 acromion maintaining some of the  CA ligament attached very superficially to try to prevent migration of the  humeral head anteriorly.  Nice flat type 1 acromion at completion.  Distal  clavicle grade 4 changes.  I was able to then resect the lateral cm  of  clavicle with shaver and high speed bur avoiding progression over to the  site of malunion.  The malunion, although prominent, was not symptomatic so  open intervention in that area was not indicated.  At completion, the  adequacy of decompression, clavicle excision, and debridement of the cuff  was felt to be adequate viewing from all portals.  Again, the cuff was  thoroughly assessed and repair was not an option.  Nice decompression  throughout.  Instruments and fluid removed.  Portals, shoulder, and bursa  injected with Marcaine.  The portals were closed with 4-0 nylon.  A sterile  compressive dressing applied.  Anesthesia reversed.  Brought to the recovery  room.  Tolerated the surgery well without complications.                                               Loreta Ave, M.D.    DFM/MEDQ  D:  04/13/2004  T:  04/13/2004  Job:  [id number]

## 2011-05-11 NOTE — H&P (Signed)
[facility]  Patient:    Kevin [name], Kevin [name]                           MRN: [id number] Adm. Date:  [id number] Attending:  Geoffery Lyons A Dictator:   Landry Corporal, N.P.                   Psychiatric Admission Assessment  IDENTIFYING INFORMATION:  A 77 year old married white male, involuntary committed on August 07, 2001, for depression and psychotic symptoms.  HISTORY OF PRESENT ILLNESS:  Patient was petitioned per his daughter.  Papers reports that patient has been showing signs and symptoms of recurrent psychotic symptoms, suddenly went into a dangerous and self-destructive behavior, abusive towards family, family was incapable of handling him in dangerous state.  Patient denies any difficulty at home.  He denies any suicidal or homicidal ideation, or threatening behavior.  He states he has been compliant with his medication.  He reports yesterday that he stayed in bed longer than usual, but otherwise denies any auditory or visual hallucinations or paranoia.  Patient does state that he will refuse to get out of bed while being hospitalized, with no specific reason.  He admits to some depressive symptoms, but denies any suicidal ideation.  He reports his appetite has been fair, and he has not been refusing meals at home.  PAST PSYCHIATRIC HISTORY:  He is to attend [facility].  He was on outpatient commitment.  He has had several inpatient admissions to [facility], the last visit in May 2002 for depression.  SOCIAL HISTORY:  A 77 year old married white male, married for 52 years.  He lives with his wife.  He is retired.  FAMILY HISTORY:  History of bipolar in the family.  ALCOHOL DRUG HISTORY:  He is a nonsmoker, denies any alcohol or substance abuse.  PAST MEDICAL HISTORY:  Primary care Keisy Strickler is uncertain at this time. Medical problems are hypertension.  Medications are Lotensin 20 mg q.a.m., Zyprexa 5 mg q.h.s.,  Effexor XR 75 mg b.i.d.  Patient apparently has been noncompliant.  DRUG ALLERGIES:  No known drug allergies.  REVIEW OF SYSTEMS:  Patient reports no fever or chills or changes in appetite. Does state that patient has been refusing meals.  No blurred or double vision. No hearing loss or sinus pain.  No chest pain or palpitations.  Has a history of hypertension.  RESPIRATORY:  Nonsmoker, no cough or shortness of breath. GI: No heartburn, change in habits or blood in the stool.  GU: No dysuria or frequency, no hematuria.  MUSCULOSKELETAL: No stiffness, swelling, or joint pain, no fractures.  SKIN: No pruritus, wounds or rashes.  NEUROLOGIC: No weakness, spasms or seizures.  PSYCHIATRIC: Long history of depression with some suicidal thoughts.  ENDOCRINE: No diabetic or thyroid problems, no enlarged or tender nodes, no history of anemia, and no environmental allergies.  VITAL SIGNS:  Patient is 5 feet 6 inches tall, he is 190 pounds.  98 temperature, 68 heart rate, 18 respirations.  Blood pressure 151/89.  GENERAL APPEARANCE:  Patient is a 77 year old white male in no acute distress, lying in bed.  He appears well-nourished.  Appears his stated age.  He is well-groomed, alert and cooperative.  HEAD:  Normocephalic.  His hair is short, grey, of even distribution.   EOMs are intact bilaterally.  ENT and MOUTH:  External ear canals are patent.  Hearing is appropriate to conversation.  No some sinus tenderness, no nasal discharge.  Tongue protrudes midline without tremor.  NECK:  Supple.  No JVD.  Negative lymphadenopathy.  Trachea is midline. Thyroid is nonpalpable and nontender.  CHEST:  Clear to auscultation.  No adventitious sounds.  No cough.  CARDIOVASCULAR:  Heart rate regular rate and rhythm without murmurs, gallops or rubs.  Carotid pulses are equal and adequate bilaterally.  Pedal pulses are equal and adequate.  There was no edema noted.  ABDOMEN:  Soft, nontender abdomen  with active bowel sounds.  MUSCULOSKELETAL:  No joint swelling or deformity.  Good range of motion. Muscle strength and tone is equal bilaterally.  No signs of injury.  SKIN:  Warm and dry.  Nail beds are pink with good capillary refill.  No rashes or lacerations.  NEUROLOGIC:  Oriented x 3.  Good grip strength bilaterally. No involuntary movements.  Patient was able to perform normal alternating movements.  LABS:  His CMET was within normal limits.  RDW was 14.1.  MENTAL STATUS EXAMINATION:  He is an alert, elderly Caucasian male.  He is lying in bed.  He refuses to get out of bed.  He is dressed in casual clothes. He is neat.  He has fair eye contact.  Speech is normal and relevant.  No spontaneous conversation.  Mood is depressed.  Affect is flat.  Thought processes:  Patient is very evasive, would not elaborate on any questions. Thought processes are coherent.  There is no evidence of psychosis, no auditory or visual hallucinations, no suicidal or homicidal ideations, no paranoia.  Cognitive function is intact.  Memory is fair, judgment is poor, insight is poor.  Reliability is uncertain.  ADMISSION DIAGNOSES: Axis I:    Major depression, recurrent. Axis II:   Deferred. Axis III:  Hypertension. Axis IV:   Moderate to severe psychosocial problems. Axis V:    Current 30, estimated this past year 60.  INITIAL PLAN OF CARE:  Involuntary commitment for threatening behavior, noncompliance and decompensating.  Contract for safety.  Check every 15 minutes.  Will resume his routine medications.  Will consider a family session.  Our goal is to return patient to his prior living arrangements, for patient to be medication compliant.  TENTATIVE LENGTH OF STAY:  Five days or more, depending on patients response to medication and follow up arrangements. DD:  08/08/01 TD:  08/09/01 Job: [id number]

## 2011-05-11 NOTE — H&P (Signed)
NAME:  MINOR, IDEN                  ACCOUNT NO.:  [id number]   MEDICAL RECORD NO.:  [id number]          PATIENT TYPE:  EMS   LOCATION:  MAJO                         FACILITY:  [facility]   PHYSICIAN:  Genene Churn. Love, M.D.    DATE OF BIRTH:  March 18, 1925   DATE OF ADMISSION:  08/28/2006  DATE OF DISCHARGE:                                HISTORY & PHYSICAL   This is one of multiple [facility] admissions for this 77 year old  right-handed white married male from [place], [place] admitted  through the emergency room for evaluation of headache, nausea, vomiting  seizures x3 and hypotension.   HISTORY OF PRESENT ILLNESS:  Mr. Wenger has a greater than 30-year history of  hypertension with known cardiac disease and has nonischemic cardiomyopathy  with associated congestive heart failure and left bundle branch block.  In  April of 2007  he underwent defibrillator pacemaker placement.  This morning  about 7:30 a.m. he awoke with nausea and vomiting.  He did have headache and  became unresponsive with three episodes of seizure-like events.  This with  characterized by stiffening of his extremities and mouth opening.  These  only lasted seconds, however.  He was intubated in the field with 20 mg of  etomidate and 5 mg of Versed.  Arrived in the emergency room intubated and a  code stroke was called.  CT scan of the brain showed atrophy without  enlargement of the ventricles, hemorrhage or documented stroke.  His blood  pressure in emergency room following his CT scan was 60/40 and 1500 mL of  saline and up to 20 mcg of dopamine were given and subsequently Levophed was  started.  First set of cardiac isoenzymes were negative and he was seen  emergency room by cardiology.  His hematocrit 32, pH of 7.292.  Sodium is  153, potassium 3.7, chloride 102, CO2 content 19, BUN 12, glucose of 170.  EKG showed paced beats.   PAST HISTORY:  Significant for hypertension, congestive heart failure,  nonischemic cardiomyopathy, obstructive sleep apnea syndrome, depression,  left and right knee surgery 2001, left shoulder surgery 2001, right and left  hernia repair in 2001.   MEDICATION:  Lasix 40 mg q.d., lorazepam 0.5 mg b.i.d., benazepril 20 mg two  q.d., __________ 100 mg two b.i.d., Effexor 75 mg two in the morning one in  the evening, Coreg 6.25 mg two in the morning and one the evening and  aspirin 81 mg q.d..   SOCIAL HISTORY:  He is married, has one daughter age 39 living and well.  He  has a significant history of depression for many years.  He went through the  eighth grade in school.  He worked in a [employer], retail in [place].   ALLERGIES:  MORPHINE.   He does not smoke cigarettes.  He does not drink alcohol.   FAMILY HISTORY:  His mother died 54.  His father died at 65, both died from  cancer.  He has had two sisters and two brothers who died from cancer.  He  has a  brother at 16 who has diabetes mellitus.   EXAMINATION:  Revealed a well-developed white male blood pressure 70/40  right and left arm on dopamine.  He was comatose. his pupils reacted 5.3  bilaterally.  He had corneals, he had no doll's eyes response and had no  grimace.  His face was twisted to the right secondary to tape with  intubation.  He had flaccid arms and both extremities.  He had some movement  of his legs. He had absent deep tendon reflexes, right plantar response is  downgoing.  The left plantar response was possibly upgoing.  Both disks were  seen and flat.  General examination revealed the tympanic membranes to be  clear.  His left arm had a pacemaker in it.  His lungs were clear.  His  heart was without murmur.  Bowel sounds were normal.  He had no enlargement  of liver, spleen or kidney.  He was circumcised.  There is no cyanosis,  clubbing or edema.  He did have pulses.   LABORATORY DATA:  Is as above.   IMPRESSION:  1. Coma 780.01.   1. Shock possibly cardiogenic in origin  with hypertension, code 785.50.  2. Seizure versus defibrillator going off  code 345.10.  3. History of congestive heart failure 416.9.  4. History of cardiac disease code 429.2.  5. Pacemaker.  6. Hypertension code 796.2.  7. Depression 311.  8. Obstructive sleep apnea syndrome code 780.57.   Plan at this time is to have the patient be seen by cardiology when he is  able, admit or I will admit depending on results of their evaluation.           ______________________________  Genene Churn. Sandria Manly, M.D.     JML/MEDQ  D:  08/28/2006  T:  08/28/2006  Job:  [id number]   cc:   Soyla Murphy. Renne Crigler, M.D.  Richard A. Alanda Amass, M.D.

## 2011-05-11 NOTE — Discharge Summary (Signed)
NAME:  Kevin [name], Kevin [name] NO.:  [id number]   MEDICAL RECORD NO.:  [id number]                   PATIENT TYPE:  INP   LOCATION:  3014                                 FACILITY:  [facility]   PHYSICIAN:  Clydene Fake, M.D.               DATE OF BIRTH:  1925-03-26   DATE OF ADMISSION:  05/18/2003  DATE OF DISCHARGE:  05/26/2003                                 DISCHARGE SUMMARY   DIAGNOSES:  1. Lumbar stenosis.  2. Unstable spondylolisthesis.   DISCHARGE DIAGNOSES:  1. Lumbar stenosis.  2. Unstable spondylolisthesis.  3. Urinary retention.   PROCEDURES:  A decompressive laminectomy, L2-4, with a posterior lumbar body  fusion at 3-4 with interbody cages and then pedicle screw fixation and  posterior fusion, L2-5.   REASON FOR ADMISSION:  Patient is a 77 year old gentleman with severe back  and leg pain, worse on the right, problems walking, and can only walk a few  yards before he has to stop and rest; it has been progressive over the last  two months, probably slowly coming on before then.   MRI and x-ray of his lumbar spine showed severe stenosis, 3-4, 4-5, and  moderate stenosis of 2-3 with some movement with flexion and extension at  all three levels and getting worse at the 3-4 where there was this  spondylolisthesis and scoliosis there.  Patient is reporting for  decompression and fusion of the lumbar spine.   HOSPITAL COURSE:  Patient was admitted the day of surgery, underwent the  procedure without complications.  He did receive 100 cc of CellSaver blood  back.  He was transferred to the recovery room and then sent on to the  intensive care unit.   He was watched closely, had only some minimal drainage from the incision,  was moving his legs well on the following day.  H&H were 8.9 and 26.6, and  we are just going to watch that.  PT and OT were involved in his care to  start working on ambulation __________.  The first day, he was a little  bit  dizzy when he got up, but that improved over the next one day.  We checked  another CBC on May 21, 2003, and it was 8.7 and 26.3, which was fairly  stable, and no other intervention was done about that.  He did have some  mild ileus with some slight abdominal distention with still no bowel  movement or flatus by May 21, 2003.  Therapy and cardiac rehab service was  consulted.  Patient did have a central line placed prior to surgery, and the  following day, he was transferred to the floor, and that was removed.  Foley  catheter was removed, too, but he did have some frequent burning with very  low volume, increased residual, still felt uncomfortable, and Foley was  placed after in-and-out cath showed  high residuals.  Urology was consulted  and saw him on May 25, 2003; catheter was removed, and he actually started  to urinate well by the morning of May 26, 2003.  Meanwhile the patient  continued to improve with his ambulation, and it was deemed unnecessary for  a rehab stay due to how well he was walking and that he may progress well  with some home therapy.  Again, he is doing well, will be discharged on May 26, 2003, in stable condition.   DISCHARGE MEDICATIONS:  1. Same as prehospitalization plus Vicodin ES p.r.n. pain.  2. Robaxin p.r.n. spasm.  3. Cipro 500 mg b.i.d. for one more week for a UTI that was found postop in     testing his urine.   DIET:  As tolerated.   ACTIVITY:  1. May shower.  2. Up with brace.   FOLLOW UP:  Follow up with Dr. Vonita Moss in two to three weeks and with  myself in three to four weeks.                                               Clydene Fake, M.D.    JRH/MEDQ  D:  05/26/2003  T:  05/26/2003  Job:  [id number]   cc:   Maretta Bees. Vonita Moss, M.D.  [address]  [place]  [place] [address]  Fax: [phone]

## 2011-05-11 NOTE — H&P (Signed)
[facility]  Patient:    Kevin [name], Kevin [name]                           MRN: [id number] Adm. Date:  [id number] Attending:  Denny Peon Dictator:   Landry Corporal, N.P.                   Psychiatric Admission Assessment  IDENTIFYING INFORMATION:  a 76 year old married white male, involuntary committed on April 16, 2001, for depression and suicidal ideation.  HISTORY OF PRESENT ILLNESS:  Patient was petitioned by his wife.  Commitment papers state that patient has been in bed all the time.  He is not eating.  He has been noncompliant with his medications.  He has decreased interest in his activities.  Patient states he has nothing to live for.  There has also been decreased personal care issues.  Patient reports he has been sleeping, stays in the bed all the time.  He has no energy, no appetite.  He denies any active suicidal thoughts, although he does state he has nothing to live for.  He denies any homicidal ideation, no auditory or visual hallucinations, reports he has been noncompliant with his medications.  He reports that they dont work.  Patient states that he just wants to give up.  PAST PSYCHIATRIC HISTORY:  He sees Dr. Elna Breslow with a diagnosis of depression.  He has had that diagnosis for years.  He has had several inpatient admissions.  The last was at [facility] in March 2002 for depression where patient states he was trying to starve himself.  SOCIAL HISTORY:  He is a 77 year old married white male, for 52 years.  He has one daughter.  He lives with his wife.  He is retired.  He has completed the 5th grade.  He has no legal problems.  FAMILY HISTORY:  He has a daughter with depression.  ALCOHOL DRUG HISTORY:  He is a nonsmoker, nondrinker, and denies any substance abuse.  PAST MEDICAL HISTORY:  Primary care Nishtha Raider is Dr. Norma Fredrickson.  Medical problems are hypertension.  Medications are Lotensin 20 mg every day, Pamelor 10  mg t.i.d., Zyprexa 2.5 mg at h.s., Zoloft 100 mg 1/2 tab q.a.m. and Ativan 0.5 mg t.i.d. p.r.n.  DRUG ALLERGIES:  No known drug allergies.  PHYSICAL EXAMINATION:  Patient appears as a healthy, elderly male.  He denies any physical complaints during the interview.  His blood pressure 157/90.  We will reweigh the patient and see if there has been a weight loss.  MENTAL STATUS EXAMINATION:  He is sleeping in bed.  He is an older Caucasian male.  He is cooperative with answering questions.  He spoke with his eyes closed and was lying down during the whole interview.  His speech is normal and slow.  Patient had no initiation of conversation or asked any questions. His mood is depressed, his affect is depression.  His thought processes are coherent.  There is no evidence of psychosis, no auditory or visual hallucinations, no suicidal ideation, although patient did talk about that he has no reason to live during the interview.  No homicidal ideation, no paranoia.  Cognitive function is intact.  Memory is fair.  Judgment is impaired.  Insight is limited.  ADMISSION DIAGNOSES: Axis I:    Major depression, recurrent. Axis II:   Deferred. Axis III:  Hypertension. Axis IV:   Mild, with some problems relating to primary  support group. Axis V:    Current 30, this past year 66.  INITIAL PLAN OF CARE:  Involuntary commitment to [facility] for depression and suicidal ideation.  Contract for safety, check every 15 minutes.  Patient agrees to be safe.  Will resume his routine medications. Will obtain his old chart.  Will encourage food and fluids.  Plan is for patient to return to his prior living arrangement and to decrease his depressive symptoms so patient can be safe.  TENTATIVE LENGTH OF STAY:  Three to five days. DD:  04/17/01 TD:  04/17/01 Job: [id number]

## 2011-05-11 NOTE — Op Note (Signed)
NAME:  Kevin [name], Kevin [name]                            ACCOUNT NO.:  [id number]   MEDICAL RECORD NO.:  [id number]                   PATIENT TYPE:  INP   LOCATION:  2550                                 FACILITY:  [facility]   PHYSICIAN:  Loreta Ave, M.D.              DATE OF BIRTH:  02-11-1925   DATE OF PROCEDURE:  08/31/2002  DATE OF DISCHARGE:                                 OPERATIVE REPORT   PREOPERATIVE DIAGNOSES:  End stage degenerative arthritis, left knee.   POSTOPERATIVE DIAGNOSES:  End stage degenerative arthritis, left knee with  varus alignment and flexion contracture.   OPERATION PERFORMED:  Left total knee replacement utilizing Osteonics  prosthesis.  Press-fit posterior stabilizing #9 femoral component.  Cemented  #11 tibial component with #11 x 15 mm polyethylene insert flex type.  Cemented recessed 30 mm patellar component.  Appropriate soft tissue  balancing.   SURGEON:  Loreta Ave, M.D.   ASSISTANT:  Arlys John D. Petrarca, P.A.-C.   ANESTHESIA:  General.   ESTIMATED BLOOD LOSS:  Minimal.   TOURNIQUET TIME:  One hour and 15 minutes.   SPECIMENS:  Excised bone and soft tissue.   CULTURES:  None.   COMPLICATIONS:  None.   DRESSING:  Soft compressive with knee immobilizer.   DRAINS:  Hemovac times two.   DESCRIPTION OF PROCEDURE:  The patient was brought to the operating room and  placed on the operating table in supine position.  After adequate anesthesia  had been obtained, the left knee examined.  A 5 to 7 degree flexion  contracture.  Further flexion to 120 degrees.  Alignment in varus barely  correctable to neutral.  Stable ligaments.  Tourniquet applied, prepped and  draped in the usual sterile fashion.  Exsanguinated with elevation and  Esmarch.  Tourniquet inflated to 350 mmHg.  Straight incision above the  patella down to the tibial tubercle.  Skin and subcutaneous tissue were  divided.  A thickened chronic prepatellar bursa excised.  Medial  parapatellar arthrotomy, hemostasis with cautery.  Grade 4 changes  throughout.  Remnants of menisci, loose bodies, periarticular spurs and  remnants of cruciate ligaments all excised.  Distal femur exposed.  The  intramedullary guide placed.  Distal cut removing 10 mm set at 5 degrees of  valgus.  Sized with a #9 component.  Jigs put in place.  Definitive cuts  made.  Trial put in place and found to fit well.  Trial removed.  Proximal  tibia exposed.  Tibial spine removed with a saw.  The intramedullary guide  placed.  Proximal cut removing 6 mm off the deficient medial side 5 degree  posterior slope cut.  Confirmed sizing with #11 component.  Patella was  sized, reamed and drilled with a 30 mm component.  Trial was then put in  place.  A #9 on the femur, #11 on the tibia and a 30 mm  on the patella.  Appropriate medial cuts were released to get a balanced knee.  Sequential  trial of different sized polyethylene inserts.  With a 15 mm insert, I had  good stability, good alignment, no lift off with flexion.  Marked for  appropriate rotation and the tibia was hand reamed.  All trials were then  removed.  The knee was copiously irrigated.  Pulse irrigating device  utilized.  Cement prepared and placed on the tibial component which was  hammered in place.  Polyethylene attached.  Femoral component seated.  Knee  reduced.  Patellar component cemented under pressure into the patella.  All  excessive cement removed.  Once the cement had hardened, the knee was re-  examined with good stability good motion, good patellofemoral tracking.  Hemovacs placed and brought out through separate stab wounds.  Arthrotomy  closed with #1 Vicryl.  Skin and subcutaneous tissue with Vicryl and  staples.  Margins of the wound and knee injected with Marcaine.  Sterile  compressive dressing applied.  Knee immobilizer applied.  Tourniquet  deflated and removed.  Anesthesia reversed.  Brought to recovery room.   Tolerated surgery well without complication.                                               Loreta Ave, M.D.    DFM/MEDQ  D:  08/31/2002  T:  08/31/2002  Job:  [id number]

## 2011-05-11 NOTE — Op Note (Signed)
NAME:  BRYSAN, MCEVOY                  ACCOUNT NO.:  [id number]   MEDICAL RECORD NO.:  [id number]          PATIENT TYPE:  OIB   LOCATION:  2899                         FACILITY:  [facility]   PHYSICIAN:  Gita Kudo, M.D. DATE OF BIRTH:  01-23-1925   DATE OF PROCEDURE:  01/09/2005  DATE OF DISCHARGE:                                 OPERATIVE REPORT   OPERATION/PROCEDURE:  Left inguinal hernia repair, combined direct and  indirect, Kugel-Bard two-piece mesh.   SURGEON:  Gita Kudo, M.D.   ANESTHESIA:  General.   PREOPERATIVE DIAGNOSIS:  Left inguinal hernia.   POSTOPERATIVE DIAGNOSIS:  Left inguinal hernia, combined indirect and  direct.   CLINICAL SUMMARY:  A 77 year old male with a left inguinal hernia that is  symptomatic.  Approximately six months ago I repaired a right inguinal  hernia and without any problem.  He would like to have this done. He has  seen Dr. Susa Griffins who felt it would perfectly safe to do his  surgery under general anesthesia.   DESCRIPTION OF PROCEDURE:  The patient had a large combined direct and  indirect hernia.  He had no cardiac complications whatsoever during the  procedure.  Tolerated it well.   The patient received 1 g Ancef preoperative and then underwent general  anesthesia.  He was positioned, prepped and draped in the standard fashion.  A total of 3O mL of 0.25% Marcaine with epinephrine was infiltrated for  postoperative analgesia.  A transverse lower abdominal incision made and  carried down to and through the external ring and external oblique.  Self-  retaining retractors were placed and excellent exposure obtained.  Cord and  its contents mobilized with a Penrose drain.  The indirect sac was  identified and was quite large.  It was twisted and reduced and at the neck  was secured with a 0 Prolene ligature and excess trimmed away.  It was then  easily reduced into the internal ring.  There was a large lipoma that was  likewise dissected from the cord structures and reduced.  Floor of the canal  was then opened with cautery from the pubis to the internal ring.  The  inferior epigastric vessels identified and dissected away to allow placement  of the mesh underneath.  The preperitoneal space was developed with finger  dissection and then the contents held away with a moistened gauze.  The  circular portion of the mesh was anchored at Cooper's with an interrupted 0  Prolene suture and unfolded laterally and inferiorly.  The gauze removed,  the mesh unfolded superiorly and medially and lay in good position, covering  the entire preperitoneal space and extending from the pubis to the femoral  vessels.  The medial portion of the mesh was secured with a 0 Prolene suture  to the undersurface of the abdominal wall and then the floor of the canal  closed over the mesh, taking intermittent bites of it with a running 0  Prolene and at the internal ring when tied.  The ring was snug and the ends  of the  suture left long.  The oval portion of the mesh was then positioned  as an onlay after being tailored to fit.  A slit made to go around  the cord  structures, and it was anchored at the internal ring with a previous suture  and tacked around the periphery with a 0 Prolene to the internal oblique,  soft tissue to the pubis, inguinal ligament.  The tails were then brought  around the cord structures and sutured to each other.  The wound lavaged  with saline and closed  in layers with running 2-0 Vicryl for external oblique, 2-0 Vicryl for deep  fascia, 3-0 Vicryl subcutaneous tissue, Steri-Strips for the skin.  Sterile,  absorbent dressings were then applied and the patient went to the recovery  room from the operating room in good condition without complications.       MRL/MEDQ  D:  01/09/2005  T:  01/09/2005  Job:  [id number]   cc:   Soyla Murphy. Renne Crigler, M.D.  [address]  [place]  [place] [address]  Fax:  [phone]   Richard A. Alanda Amass, M.D.  [phone] [address]  [place]  [place] [address]  Fax: [phone]

## 2011-05-11 NOTE — Discharge Summary (Signed)
[facility]  Patient:    Kevin [name], Kevin [name]                           MRN: [id number] Adm. Date:  [id number] Disc. Date: [id number] Attending:  Geoffery Lyons A                           Discharge Summary  CHIEF COMPLAINT AND PRESENTING ILLNESS:  This is one of multiple admissions to [facility] for this 77 year old male, married, involuntary petitioned by his daughter for depression.  Has not been taking his medications, has been getting agitated and delusional.  The morning of the admission, he was lying in bed under the covers stating that he just wants to lie in bed so that he will die.  Does not want to do anything.  He just wants to die.  Refusing to eat.  Admitted to suicidal ideas with a plan to withdraw and lie in his bed.  Denied any auditory or visual hallucinations. Endorsed poor appetite for a week or two.  Has been tired, lying in bed for the past 2 or 3 days.  PAST PSYCHIATRIC HISTORY:  Multiple admissions, has had ECT, followed by Rozanna Box on an outpatient basis.  Last time discharged April 21, 2001.   ALCOHOL AND DRUG HISTORY:  No history of any use of abuse of substances.  MEDICAL HISTORY:  Positive for arterial hypertension.  His medications:  He is seeing Elna Breslow for follow up.  Lotensin 20 mg every day for hypertension, Pamelor 10 mg 3 times a day, Zoloft 100 mg 1/2 tablet every day, and Zyprexa 5 mg at bedtime.  PHYSICAL EXAMINATION:  Performed at emergency room, failed to show any positive findings.  Temperature 98.5, pulse 95, respirations 20, blood pressure 161/103.  MENTAL STATUS EXAMINATION:  Upon admission, revealed a sleepy male, casually dressed, lying on top of his covers, opens his eyes only with much encouragement, passively cooperative, mainly irritable.  He seems to demonstrate some psychomotor retardation.  He hesitates, responds only to questions.  Mood is of depression, withdrawal, irritability.   Affect broad. Does admit to suicidal ideations, no specific plans.  Denies homicidal ideations.  Cognition intact.  ADMISSION DIAGNOSES: Axis I:     Major depression, recurrent, severe. Axis II:    Rule out personality disorder not otherwise specified. Axis III:   Arterial hypertension. Axis IV:    Moderate. Axis V:     Upon admission 30, highest in last year 60.  COURSE IN HOSPITAL:  He was admitted and started on intensive individual and group psychotherapy.  Giving the fact that he has been admitted numerous times to the inpatient unit and he does not comply with follow up, we went ahead and kept him on involuntary commitment and recommended to the court for him to be placed on 90 day outpatient commitment.  The court agreed with the disposition, so we was discharged to continue follow up with [facility], where he was going to see Harolyn Rutherford.  He was going to be admitted to the PACT team.  They were going to keep an eye on him, be sure that he complied with his medication.  DISCHARGE DIAGNOSES: Axis I:    Major depression, severe, recurrent. Axis II:   Personality disorder not otherwise specified. Axis III:  Arterial hypertension. Axis IV:   Moderate. Axis V:  Upon discharge 60.  DISCHARGE MEDICATIONS: 1. Zyprexa 5 mg at bedtime. 2. Effexor 75 mg twice a day. 3. Lotensin 20 mg every day. DD:  07/22/01 TD:  07/22/01 Job: [id number]

## 2011-05-11 NOTE — Op Note (Signed)
NAME:  Kevin [name], Kevin [name]                            ACCOUNT NO.:  [id number]   MEDICAL RECORD NO.:  [id number]                   PATIENT TYPE:  AMB   LOCATION:  ENDO                                 FACILITY:  [facility]   PHYSICIAN:  Georgiana Spinner, M.D.                 DATE OF BIRTH:  1925/04/10   DATE OF PROCEDURE:  05/23/2004  DATE OF DISCHARGE:                                 OPERATIVE REPORT   PROCEDURE:  Colonoscopy.   ENDOSCOPIST:  Georgiana Spinner, M.D.   INDICATIONS FOR PROCEDURE:  Rectal bleeding.   ANESTHESIA:  Demerol 20 mg, Versed 2 mg.   DESCRIPTION OF PROCEDURE:  With the patient mildly sedated in the left  lateral decubitus position, the Olympus videoscopic colonoscope was inserted  into the rectum and passed, after a normal rectal examination, under direct  vision to the cecum, identified by the ileocecal valve and appendiceal  orifice, both of which were photographed.  From this point the colonoscope  was slowly withdrawn, taking circumferential views of the colonic mucosa,  stopping only in the rectum, which appeared normal on direct and showed  hemorrhoids on retroflexed view.  The endoscope was straightened and  withdrawn.  The patient's vital signs and pulse oximeter remained stable.  The patient tolerated the procedure well with no apparent complications.   FINDINGS:  Internal hemorrhoids, otherwise an unremarkable examination.   PLAN:  Because of what appears to be iron deficiency, I think a small bowel  series is indicated.  I will have the patient follow up with me  subsequently.                                               Georgiana Spinner, M.D.    GMO/MEDQ  D:  05/23/2004  T:  05/23/2004  Job:  [id number]

## 2011-05-11 NOTE — H&P (Signed)
[facility]  Patient:    Kevin [name], Kevin [name]                           MRN: [id number] Adm. Date:  [id number] Attending:  Denny Peon                   Psychiatric Admission Assessment  DATE OF ADMISSION:  March 01, 2001.  INTRODUCTION:  Kevin [name] is a 77 year old white married male, father of one adult daughter.  He was hospitalized recently in October 2001 in service of Dr. Rozanna Box with symptoms of depression.  He came again with complaint of depression, lack of energy, lack of appetite, losing weight, and losing drive to live.  "Im going to starve myself.  Im going to give up.  No way I can and want to live."  Patient was doing well on low dose of Ritalin 20 mg daily and Prozac 90 mg weekly until 3 weeks ago when he started getting depressed.  At that time, Dr. Katrinka Blazing increased Prozac 90 mg to twice a week with no improvement.  At this point also Zyprexa was introduced in dose 1.25 mg at h.s.  In spite of these changes in medication, patient started deteriorating up to the point where he had to be admitted.  Patient feels that he was pushed by the family for admission and if he did not agree he would be involuntarily committed.  He still wanted to die "with everything that is going on I dont want to live.  They expect too much from me."  Patient complains of initial and middle insomnia, reports racing thoughts, decreased of appetite, 30 pound weight loss over past 2 months, feels weak, unneeded and sickly.  PAST PSYCHIATRIC HISTORY:  He has had previous hospitalization and several suicide attempts by overdose.  Fifteen years ago he underwent a course of ECT with some improvement, but at this point he does not want to even hear about electroconvulsive therapy.  He was followed on an outpatient basis by Dr. Katrinka Blazing.  SUBSTANCE ABUSE HISTORY:  While young, he would drink in excess but not recently.  He does not use any drugs.  PAST MEDICAL HISTORY:   Patient suffers from hypertension and chronic back pain.  At this point, he is not on hypertensive medication, only taking Prozac, ______, and Zyprexa.  Recently, Lotensin was introduced but he does not take it on a regular basis.  Patient is not allergic to any medication.  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  He was depressed on and off before, but depression got worse after her retirement which took place 8 or 9 years ago. At this point feels that his family is getting on his case and he does not have room for himself and freedom.  Everyone expects something from him and cannot take the pressure any more.  MENTAL STATUS EXAMINATION:  Tall, strongly built, elderly white male, poor eye contact, decreased motor activity, slow speech, speech somehow hesitant, slow. No hallucinations.  Reports racing thoughts.  Affect was blunted, almost flat. Thought with slow pace, preoccupied with thoughts to give up and die, thoughts of being a burden.  No delusions, no ideas of reference.  Suicidal thoughts with plan to starve himself to death.  No symptoms of OCD of bipolarity. Alert and oriented x 3, with fair memory but decreased  concentration. Severely impaired insight and judgment, poor reliability.  Average intelligence.  ADMISSION DIAGNOSES: Axis I:    Major depression, recurrent, severe. Axis II:   No diagnosis. Axis III:  Arterial hypertension, chronic arteriosclerosis. Axis IV:   Moderate, related to problems with primary support group and            medical problems. Axis V:    Global assessment of function at present 25, maximum for past year            60.  INITIAL PLAN OF CARE:  Will continue special observation.  Patient is able to contract for safety while on the unit.  Will change medication, add Pamelor 10 mg twice a day, increase Zyprexa and introduce Zoloft which was never tried before.  Will try to  arrange family session to help patient to deal with his difficulties.  We will use Ensure for supplement and ask for an additional consultation to help with nutritional status. DD:  03/02/01 TD:  03/02/01 Job: [id number]

## 2011-05-11 NOTE — Discharge Summary (Signed)
NAME:  Kevin [name]                  ACCOUNT NO.:  [id number]   MEDICAL RECORD NO.:  [id number]          PATIENT TYPE:  INP   LOCATION:  3735                         FACILITY:  [facility]   PHYSICIAN:  Kevin [name], M.D.DATE OF BIRTH:  09/10/1925   DATE OF ADMISSION:  05/17/2006  DATE OF DISCHARGE:  05/19/2006                                 DISCHARGE SUMMARY   [name] is a 77 year old male patient who has a history of  nonischemic cardiomyopathy. He underwent a cardiac catheterization in June  2005 by Dr. Aleen [name]. He has normal coronary arteries. He had been seen by  Dr. Alanda [name] in the office and evaluated for BiV ICD. He underwent 2-D  echocardiogram and it revealed that left ventricular dyssynchrony. He also  had 3-4 congestive heart failure and left bundle branch block thus he was  recommended for BiV ICD. He came into the hospital for this elective  procedure and he underwent implantation of the [facility]. Jude Atlas ATF model  #V/343 ICD, serial [id number] by Kevin [name] on May 17, 2006. Please see  Dr. Kandis [name] note for complete details. The following day he was up  walking in the halls. He apparently became dizzy after his second walk and  also got nauseated. The patient and family were not comfortable taking him  home thus he was kept another day. He was also started on Coreg 3.125 mg  b.i.d. His chest x-ray showed no pneumothorax. He had some right lower lobe  and right middle lobe atelectasis. On May 18, 2006, his temperature did  spike up to 101, a UA was sent, it did show some bacteria and some  microscopic blood thus on May 19, 2006 his temperature was 99.3, urine  culture and sensitivity was sent down and he was started on Cipro 250 mg  b.i.d. He was seen by Dr. Susa [name], considered stable to be  discharged home. His blood pressure was 133/85, heart rate was 82,  respirations were 22, his temperature was 99.3. He did have one episode of  NSVT. On May 18, 2006, he was asymptomatic.   LABORATORY DATA:  Sodium was 137, potassium 4.2, BUN 10, creatinine 1.1. His  hemoglobin was 10.5, hematocrit 33, platelets were 245 and WBCs were 5.3.   DISCHARGE MEDICATIONS:  Hold aspirin and do not take any aspirin products  until seen in the office.  1.  Coreg 3.125 mg twice per day.  2.  Wellbutrin 200 mg once per day.  3.  Effexor 75 mg 3 tabs daily.  4.  Lanoxin 0.125 mg every day.  5.  Lasix 20 mg every day.  6.  Protonix 40 mg every day.  7.  Iron daily.  8.  Ativan when needed.  9.  Cipro 250 mg twice per day.  10. Lotensin 40 mg every day.   He was given written instructions on how to take care of his pacemaker ICD  and what to do if it fires. He will go to Dr. Kandis [name] office in 7-10  days to have his incision checked. He was told  to keep it dry for one week.  Our office will call him with an appointment.   DISCHARGE DIAGNOSES:  1.  Nonischemic cardiomyopathy with an ejection fraction less than 35%.  2.  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  3.  Status post BiV ICD placed with a St. Jude Atlas ATF.  4.  Normal coronary arteries by catheterization in 2005.  5.  History of anemia worked up in the past by Dr. Virginia [name].  6.  Left bundle branch block.  7.  Sleep apnea on CPAP.  8.  Hypertension.      Kevin [name], N.P.      Kevin [name], M.D.  Electronically Signed    BB/MEDQ  D:  05/19/2006  T:  05/20/2006  Job:  [id number]   cc:   Kevin [name]. Kevin [name], M.D.  Fax: [phone]

## 2011-05-11 NOTE — H&P (Signed)
[facility]  Patient:    Kevin [name], Kevin [name]                           MRN: [id number] Adm. Date:  [id number] Attending:  Denny Peon Dictator:   Young Berry Lorin Picket, N.P.                   Psychiatric Admission Assessment  DATE OF ADMISSION:  May 21, 2001.  IDENTIFYING INFORMATION:  This is a 77 year old Caucasian male who is married, involuntary petition by the daughter for depression.  The petition states that the patient has not been taking his medications, has been getting agitated and delusional.  REASON FOR ADMISSION AND SYMPTOMS:  This morning, the patient is lying in bed under the covers and states that he just wants to lie in bed and so then he will die if he doesnt do anything and just lays there.  He is refusing to go to breakfast or lunch.  He admits to suicidal ideation with a plan to withdraw and lie in his bed.  He denies any auditory or visual hallucinations.  He does endorse poor appetite for a week or two, and that he has been tired and laying in the bed, he states for the past 2 to 3 days.  He denies any homicidal ideation.  PAST PSYCHIATRIC HISTORY:  Patient is followed by Dr. Elna Breslow, M.D., his outpatient psychiatrist.  He has a history of multiple previous inpatient admissions including here at [facility], last discharged April 21, 2001, prior to that in February 2002 and in September of 2001, all for the same diagnosis, severe depression and medication compliance.  SOCIAL HISTORY:  This is a 77 year old white male who has been married for 52 years.  He has one daughter.  He lives with his wife.  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  FAMILY HISTORY:  Positive for a daughter with depression.  ALCOHOL AND DRUG HISTORY:  Patient is a nonsmoker, he is a nondrinker, he denies any substance abuse.  PAST MEDICAL HISTORY:  Patient is followed by Dr. Renne Crigler, who is his primary care  physician.  Medical problems include hypertension.  Current medication list is somewhat unclear at this time.  We will contact Dr. Elna Breslow, his psychiatrist for his current psychiatric meds, since patient states that Dr. Katrinka Blazing has changed his medications.  Patient has reportedly been noncompliant with his medications.  He has previously been on Lotensin 20 mg p.o. q.d. for hypertension, and he was discharged from here 1 month ago on Pamelor 10 mg 1 tab t.i.d., Zoloft 100 mg 1/2 tab daily, and Zyprexa 5 mg 1 tab q.h.s.  DRUG ALLERGIES:  No known drug allergies.  POSITIVE PHYSICAL FINDINGS:  Patients PE is pending.  He refuses it as this time secondary to his depression and his level of withdrawal and his irritability.  He is generally healthy in appearance at  this time.  Vital signs on admission are temp 98.5, pulse 95, respirations 20, blood pressure 161/103.  Height is 5 feet 6 inches tall and 188 pounds.  His previous weight was 179-1/4 pounds, so he has had about a 9 pound weight gain since previous admission.  Patient has complained that his right hand has been swelling, and it does appear that he has a little bit of swelling over his right eye, but there is no evidence of injury, and  although patient is complaining of his right hand swelling, objectively we are unable to see any symptoms of swelling or any signs.  Labs are pending at this time.  MENTAL STATUS EXAMINATION:  This is a sleepy male, casually dressed, lying on top of the covers.  He opens his eyes only with much encouragement.  He is passively cooperative.  He is mildly irritable.  He seems to demonstrate in his speech and manner some psychomotor retardation.  Speech is positive with some latency.  No spontaneous speech offered and the patient offers minimal responses only to questions.  Mood is depressed and withdrawn and irritable. Thought process shows a little bit of agitation.  He is a little bit puzzled with  questions.  He is positive for suicidal ideation with no specific active plan or intent.  Negative for homicidal ideation.  We are not able to evaluate him thoroughly for delusions or signs of paranoia; however, initially he does not appear to be psychotic at this time.  Cognitively, he seems intact.  ADMISSION DIAGNOSES: Axis I:    Major depression, recurrent, severe. Axis II:   Deferred. Axis III:  Hypertension. Axis IV:   Moderate problems with medication compliance when he is at home. Axis V:    Current 30, past year 59.  INITIAL PLAN OF CARE:  Plan is to admit the patient to stabilize his mood. Q.15 minute checks are in place.  We will phone Dr. Elna Breslow for medication information.  We will encourage gatorade and offer him Ensure 250 cc p.o. t.i.d.  We will check his routine labs since it has been one month since the patient was last admitted, and we will ask the case manager to explore medication compliance issues with the family.  We will restart his Zoloft, Pamelor and Zyprexa since the patient had responded well previously on these medications.  TENTATIVE LENGTH OF STAY:  Five days. DD:  05/22/01 TD:  05/22/01 Job: [id number]

## 2011-05-11 NOTE — Discharge Summary (Signed)
[facility]  Patient:    Kevin [name], Kevin [name]                           MRN: [id number] Adm. Date:  [id number] Disc. Date: [id number] Attending:  Fortunato Curling                           Discharge Summary  HISTORY OF PRESENT ILLNESS:  Kevin [name] is a 77 year old married white male who was admitted on a voluntary basis with a history of severe recurrent depression who was doing well over the summer until he stopped taking his medications about 3-4 weeks ago and has become extremely depressed, anergic, hopeless, helpless, not eating and expressing suicidal ideation to his wife and daughter.  Thus, be cause of his deteriorated condition, we felt that psychiatric hospitalization was medically necessary.  LABORATORY DATA:  No labs were obtained as I had recently had a full panel of labs checked at my office earlier in the month.  HOSPITAL COURSE:  The patient was admitted to the [facility] at [facility] under the care of Dr. Elna Breslow.  He was initially placed on 15 minute checks and therapeutic level II to insure his safety.   During the early part of the hospitalization the patient was very withdrawn with an extremely blunted affect.  He was not eating.  As long term compliance has been a problem for this patient, we began him on a trial of Prozac with the idea of being able to switch him to Prozac weekly, hoping this would improve long term compliance.  I also continued his Ritalin to help potentiate the antidepressant response.  The patient was seen by Dr. Milagros Evener over the weekend in my absence.  His condition was minimally improved during this time. When I reevaluated the patient on September 23, 2000, he was somewhat dizzy after getting a dose of Remeron for sleep and wanted to stop it.  His affect was slightly improved.  He was tolerated the Prozac well, so he was given a dose of Prozac weekly on September 24, 2000.  His energy level  seemed to be improving.  As he was a little too sedated, this was changed to p.r.n. Ativan for anxiety symptoms.  On September 25, 2000, the patient seemed much improved. His mood was more hopeful and his affect was brighter.  He had taken his medications and was tolerating the Prozac weekly well; thus, I felt his condition was adequately stabilized, so he could be discharged home.  He denied any suicidal ideation.   DISCHARGE MEDICATIONS:  Ritalin 20 mg q.a.m., Lotensin 20 mg q.a.m., Ativan 0.5 mg b.i.d. to t.i.d. p.r.n. and Prozac weekly 90 mg per week.  FOLLOW-UP:  Follow-up for this patient will be with Dr. Elna Breslow on October 98, 2001.  DISCHARGE DIAGNOSES: Axis I:    Major depression, recurrent, severe. Axis II:   None. Axis III:  Hypertension. Axis IV:   Stressors are moderate. Axis V:    Current global assessment of functioning 50, highest in past year            is 65. DD:  10/10/00 TD:  10/10/00 Job: [id number]

## 2011-05-11 NOTE — Consult Note (Signed)
NAME:  Kevin [name], Kevin [name]                  ACCOUNT NO.:  [id number]   MEDICAL RECORD NO.:  [id number]          PATIENT TYPE:  INP   LOCATION:  6526                         FACILITY:  [facility]   PHYSICIAN:  Griffith Citron, M.D.DATE OF BIRTH:  [birth year]   DATE OF CONSULTATION:  04/18/2006  DATE OF DISCHARGE:                                   CONSULTATION   PRIMARY CARE PHYSICIAN:  Soyla Murphy. Renne Crigler, M.D.   REASON FOR CONSULTATION:  The patient is a 77 year old white male who has  been followed extensively by Dr. Susa Griffins for nonischemic  cardiomyopathy.  He was admitted to the hospital because of progressive  weakness and dyspnea on exertion.  He was found to have iron deficiency  anemia with transferrin saturation of 3%, hemoglobin 9.9, hematocrit 30.   The patient has several GI symptoms, many of which have been developing in  recent months.  Most notably, he has developed severe constipation over the  past 4-5 months.  Using Laxatives, he had to make an urgent care visit  because of these symptoms at which time a flat plate of the abdomen revealed  no evidence of obstruction, per patient report.  He denies hematochezia or  change in stool caliber.  He has not undergone prior colorectal cancer  screening.  Significant family history of father deceased from intestinal  cancer.  the patient has a vague recollection of a fluoroscopic or similar  procedure, but this was performed remotely over 10 years ago per his  recollection.  No other records available to review in this regard.   HISTORY OF PRESENT ILLNESS:  The patient denies abdominal pain.  No nausea  or vomiting.  No odynophagia.  He does experience solid food dysphagia with  meat.  No impaction, which he prevents by chewing food carefully and  drinking copious liquids.  Although his appetite has remained good, he has  lost 9-10 pounds over the past couple of months.  He denies hematemesis.   Risk factors include aspirin 81 mg  daily, use of Alleve or Advil p.r.n.  which occurs sporadically.  No tobacco use, rare social drink.   PAST MEDICAL HISTORY:  1.  Nonischemic cardiomyopathy.  2.  Sleep apnea.  3.  Bilateral inguinal hernia repair.   OUTPATIENT MEDICATIONS:  1.  Benazepril 40 mg daily.  2.  Aspirin 81 mg daily.  3.  Wellbutrin.  4.  Effexor.   ALLERGIES:  MORPHINE.   FAMILY HISTORY:  Father deceased of intestinal cancer.  Mother deceased of  CVA.  Two brothers deceased, one from bone and one from lung cancer.  One  sister, elderly with coronary artery disease.   SOCIAL HISTORY:  Retired Architectural technologist, married, one daughter, two  grandchildren, one great grandchild.  No other family members with GI  disorder.   REVIEW OF SYSTEMS:  Noncontributory.   PHYSICAL EXAMINATION:  GENERAL APPEARANCE:  Healthy appearing older white  male appearing younger than stated age.  Alert, oriented, conversant,  comfortable, ambulatory.  VITAL SIGNS:  Stable.  HEENT:  Anicteric sclerae.  No conjunctival  pallor.  Mouth:  No lesion of  lip, gums or tongue.  NECK:  Supple.  No adenopathy.  No thyromegaly.  No carotid bruit.  CHEST:  Clear to auscultation.  CARDIAC:  Regular rhythm.  No gallop or murmur appreciated.  ABDOMEN:  Soft, nondistended, nontender.  No mass or firmness.  Organomegaly  is not appreciated.  Hepar span approximately 10 cm to percussion.  Bowel  sounds are present.  No borborygmi bruits or splash.  No fluid weight,  capita venous prominence.  Rectal not performed.  EXTREMITIES:  No cyanosis, clubbing.  Trace pretibial edema.  SKIN:  No lesions.  NEUROLOGICAL:  Nonfocal.   LABORATORY DATA:  See above.   ASSESSMENT:  1.  Iron deficiency anemia.  2.  Weight loss.  3.  Constipation, new onset, progressive.  4.  Dysphagia 4 - solids.  5.  Family history of intestinal cancer.   The patient's presentation is ominous for an underlying colorectal cancer.  This needs to be evaluated with  colonoscopy.  In addition, upper intestinal  pathology with a lesion of the esophagus or GE junction resulting in  dysphagia needs to be excluded.  Both colonoscopy and endoscopy need to be  performed.  If these studies are normal, then capsule endoscopy should be  completed, though I doubt this will be necessary.  The timing of these  studies will be discussed with the cardiologist to determine whether the  patient can proceed on an outpatient basis.   RECOMMENDATIONS:  1.  Colonoscopy.  2.  Endoscopy.  3.  Consider capsule endoscopy.      Griffith Citron, M.D.  Electronically Signed     JRM/MEDQ  D:  04/18/2006  T:  04/19/2006  Job:  [id number]   cc:   Nicki Guadalajara, M.D.  Fax: [phone]   Soyla Murphy. Renne Crigler, M.D.  Fax: [phone]

## 2011-05-11 NOTE — Discharge Summary (Signed)
NAME:  Kevin [name], Kevin [name]                  ACCOUNT NO.:  [id number]   MEDICAL RECORD NO.:  [id number]          PATIENT TYPE:  INP   LOCATION:  6526                         FACILITY:  [facility]   PHYSICIAN:  Richard A. Alanda Amass, M.D.DATE OF BIRTH:  June 24, 1925   DATE OF ADMISSION:  04/17/2006  DATE OF DISCHARGE:  04/19/2006                                 DISCHARGE SUMMARY   DISCHARGE DIAGNOSES:  1.  Nonischemic cardiomyopathy with congestive heart failure, improved at      discharge.  2.  Normal coronaries by catheterization in 2005.  3.  History of anemia with low iron, negative endoscopy, colonoscopy and      capsule endoscopy in May 2005.  4.  Left bundle branch block.  5.  Sleep apnea, on continuous positive airway pressure.  6.  Treated hypertension.   HOSPITAL COURSE:  The patient is a 77 year old male with a known nonischemic  cardiomyopathy.  He was catheterized in June 2005 and had essentially normal  coronaries.  His last echocardiogram showed his EF to be 35%.  He had a  Cardiolite done November 22, 2005, that showed a question of septal wall  ischemia but was low-risk.  His EF at [facility] was 29%.  He was admitted  April 17, 2006, with dyspnea.  His troponins were essentially negative.  He  did have one troponin of 0.05.  His CKs were low with a peak of 62.  His BNP  on admission was 964.  He was also noted to be anemic on admission with a  hemoglobin of 9.5 and his iron level 11.  The patient was admitted, started  on IV diuretics.  Lanoxin was also added.  The patient was seen in consult  by Dr. Kinnie Scales.  Apparently the patient had forgotten that he had been  evaluated by Dr. Virginia Rochester.  Dr. Kinnie Scales saw the patient April 18, 2006.  Unfortunately, when Dr. Kinnie Scales saw the patient the computers were down and  he had no access to his previous workup.  This all became clear the next day  when the patient's daughter spoke with Dr. Kinnie Scales.  The patient's daughter  says that, in fact, Mr.  [name] has had a colonoscopy, endoscopy and a capsule  endoscopy in May 2005 and it was all negative.  At this point Dr. Kinnie Scales  felt no further GI evaluation would be undertaken.  An echocardiogram done  April 18, 2006, showed severe biventricular dysfunction.  Symptomatically  the patient is better after diuresis on the 27th.  We feel he can be  discharged.  He will follow up with Dr. Alanda Amass on May 15.  We may need to  consider a bi-V ICD.   DISCHARGE LABORATORY DATA:  White count 5.4, hemoglobin 9.7, hematocrit  30.2, platelets 287.  Sodium 136, potassium 3.7, BUN 14, creatinine 1.0.  BNP is down to 535.  The patient did have some bacteria in his urine and a  urine culture is pending at the time of this dictation.  Liver functions are  normal with an SGOT of 22, SGPT 25.  INR  is 1.0.  TSH 1.3.  PSA is 3.09.  Chest x-ray on the 25th shows mild cardiomegaly with bilateral passive  hyperemia but no effusion.  EKG showed sinus rhythm with a left bundle  branch block.   DISPOSITION:  The patient will be discharged and follow up with Dr.  Alanda Amass May 15 at 3:15.   DISCHARGE MEDICATIONS:  1.  Benazepril 40 mg a day.  2.  Effexor 75 mg once a day.  3.  Wellbutrin 200 mg once a day.  4.  Flomax 0.4 mg once a day.  5.  Lanoxin 0.125 mg a day.  6.  Furosemide, he will take 40 mg a day for the next two days and then go      to 20 mg a day.  7.  Iron 325 mg twice a day with food.  8.  Vitamin C 500 mg twice a day.  9.  Protonix 40 mg a day for one month and then Prilosec over-the-counter      once a day.      Abelino Derrick, P.A.      Richard A. Alanda Amass, M.D.  Electronically Signed    LKK/MEDQ  D:  04/19/2006  T:  04/20/2006  Job:  [id number]   cc:   Soyla Murphy. Renne Crigler, M.D.  Fax: [phone]   Georgiana Spinner, M.D.  Fax: [phone]

## 2011-05-11 NOTE — Op Note (Signed)
NAME:  Kevin [name], Kevin [name]                            ACCOUNT NO.:  [id number]   MEDICAL RECORD NO.:  [id number]                   PATIENT TYPE:  AMB   LOCATION:  ENDO                                 FACILITY:  [facility]   PHYSICIAN:  Georgiana Spinner, M.D.                 DATE OF BIRTH:  Sep 12, 1925   DATE OF PROCEDURE:  05/23/2004  DATE OF DISCHARGE:                                 OPERATIVE REPORT   PROCEDURE:  Upper endoscopy.   INDICATIONS:  Hemoccult-positivity.   ANESTHESIA:  Demerol 50, Versed 6 mg.   DESCRIPTION OF PROCEDURE:  With the patient mildly sedated in the left  lateral decubitus position, the Olympus videoscopic endoscope was inserted  in the mouth, passed under direct vision through the esophagus which  appeared normal, into the stomach through a hiatal hernia.  The fundus,  body, antrum, duodenal bulb, and second portion of the duodenal all appeared  normal.  From this point, the endoscope was slowly withdrawn taking  circumferential views of the duodenal mucosal until the endoscope was pulled  back into the stomach, placed in retroflexion to view the stomach from  below.  The endoscope was straightened and withdrawn taking circumferential  views of the remaining gastric and esophageal mucosa.  The patient's vital  signs and pulse oximeter remained stable.  The patient tolerated the  procedure well and without apparent complications.   FINDINGS:  A large hiatal hernia, otherwise unremarkable examination.   PLAN:  Proceed to colonoscopy.                                               Georgiana Spinner, M.D.    GMO/MEDQ  D:  05/23/2004  T:  05/23/2004  Job:  [id number]

## 2011-05-11 NOTE — Discharge Summary (Signed)
[facility]  Patient:    Kevin [name], Kevin [name] Visit Number: [id number] MRN: [id number]          Service Type: PSY Location: 50 [id number] Attending Physician:  Rachael Fee Dictated by:   Reymundo Poll Dub Mikes, M.D. Admit Date:  08/07/2001 Discharge Date: 08/18/2001                             Discharge Summary  CHIEF COMPLAINT AND HISTORY OF PRESENT ILLNESS:  This was one of multiple admissions to [facility] for this 77 year old male who was petitioned by his daughter when he, again, apparently started isolating and not wanting to get out of the house, not eating, not drinking, claiming he wanted to die.  He apparently has been compliant with medications. Denied any auditory or visual hallucinations.  The family felt that if something was not done, he was going to back into the downhill course.  PAST PSYCHIATRIC HISTORY:  Involuntarily committed to outpatient treatment at [facility].  Prior to that, saw Rozanna Box, M.D. with multiple inpatient stays, ECT, and multiple trials with medications.  SUBSTANCE ABUSE HISTORY:  No history of alcohol or drug abuse.  PAST MEDICAL HISTORY:  Hypertension.  MEDICATIONS: 1. Lotensin 20 mg every day. 2. Zyprexa 5 mg at bedtime. 3. Effexor XR 75 mg twice a day.  MENTAL STATUS EXAMINATION:  Reveals a well-nourished, well-developed, alert, cooperative male.  Mood: Depressed.  Affect: Depressed.  Some psychomotor retardation.  Thought process: Very evasive, would not elaborate on the questions.  Denied any suicidal or homicidal ideas, no auditory or visual hallucinations.  Cognition: Intact.  ADMITTING DIAGNOSES: Axis I:    Major depression, recurrent. Axis II:   No diagnosis. Axis III:  Hypertension. Axis IV:   Moderate. Axis V:    Global assessment of functioning upon admission 30, highest global            assessment of functioning in the last year  60.  HOSPITAL COURSE:  He was admitted and started in intensive individual and group psychotherapy.  The course fluctuated between him staying in bed and not wanting to get out, not wanting to eat, but there were some moments of some brighter affect.  A lot issues, some were what could be considered passive-aggressive behavior toward his wife if she would not call or do what he expected.  Medication-wise, we kept the Effexor, went up to 150 mg twice a day, Zyprexa 2.5 mg at bedtime.  We tried Provigil but he did not tolerate the Provigil.  Somehow he got to feel better.  On August 26 he felt he had obtained full benefit.  His mood had improved.  His affect was brighter.  He was out and about, he was getting out of bed, and he had some interaction with the family so was discharged.  He denied any suicidal ideas or any homicidal ideas do discharged was considered and granted.  DISCHARGE DIAGNOSES: Axis I:    Major depression, recurrent. Axis II:   No diagnosis. Axis III:  Hypertension. Axis IV:   Moderate. Axis V:    Global assessment of functioning upon discharge 55-60.  DISCHARGE MEDICATIONS: 1. Effexor XR 150 mg twice a day. 2. Zyprexa 2.5 at bedtime. 3. Lotensin 20 mg every day. 4. Ambien 10 mg at bedtime as needed for sleep. Dictated by:   Reymundo Poll Dub Mikes, M.D. Attending Physician:  Geoffery Lyons A DD:  09/17/01 TD:  09/18/01 Job: [id number]

## 2011-05-11 NOTE — Op Note (Signed)
NAME:  Kevin, [name] NO.:  [id number]   MEDICAL RECORD NO.:  [id number]                   PATIENT TYPE:  OBV   LOCATION:  4540                                 FACILITY:  [facility]   PHYSICIAN:  Gita Kudo, M.D.              DATE OF BIRTH:  Feb 14, 1925   DATE OF PROCEDURE:  07/07/2004  DATE OF DISCHARGE:                                 OPERATIVE REPORT   PREOPERATIVE DIAGNOSIS:  Right inguinal hernia extending to the scrotum.   POSTOPERATIVE DIAGNOSIS:  Right inguinal hernia extending to the scrotum  with right hydrocele, not contiguous.   OPERATIVE PROCEDURES:  1. Right inguinal hernia repair, direct and indirect.  2. Excision, hydrocele, right scrotum.   SURGEON:  Gita Kudo, M.D.   ANESTHESIA:  General.   CLINICAL SUMMARY:  A 77 year old male with increasing size of hernia that is  somewhat symptomatic and comes in for repair.   OPERATIVE FINDINGS:  The patient had a large indirect hernia which was  easily reducible.  There was a medium-sized direct hernia.  There was a  large hydrocele.  Cord structures, nerves identified and not injured.   OPERATIVE PROCEDURE:  Under satisfactory general anesthesia, having received  1.0 g Ancef preop, the patient was positioned, prepped and draped in a  standard fashion.  A total of 40 mL of 0.5% Marcaine with epinephrine was  infiltrated during the procedure for postop analgesia.  Transverse incision  made in the right lower quadrant and carried down to and through the  external ring and external oblique.  Bleeders coagulated or tied with 3-0  Vicryl and good exposure obtained with self-retaining retractor.  The hernia  was identified, dissected high, and noted to be empty.  A large lipoma of  the cord was also identified, dissected high, ligated, and excised.  Then  with traction upward, the testicle came into view and the large hydrocele  noted.   The hydrocele was opened with cautery and then  its sac excised close to the  testicle but not enough to injure the testicle.  Hemostasis was good and the  testis was returned to the scrotum.  The hernia sac was twisted, controlled  high with a 0 Prolene suture ligature, and excess trimmed away and some  Marcaine infiltrated in the stump.  Floor of the canal opened with cautery  from the pubis to the internal ring and finger dissection used to develop  the prepeperitoneal space and the contents held away with a moistened gauze.   Kugel mesh was then applied.  The circular portion was anchored to Cooper's  ligament with a 0 Prolene suture and unfolded laterally and inferiorly.  Then the gauze was removed and the mesh unfolded superiorly and medially.  It lay in good position, flat, and extended from the pubis to the great  vessels and lay under the inferior epigastric vessels.  The  floor of the  canal then closed over this with a running 0 Prolene suture, taking  intermittent bites of the mesh.  Then the oval portion of the mesh was  anchored at the internal ring with the previous suture and tacked around the  periphery with three sutures to the inguinal ligament, soft tissue near the  pubis, internal oblique.  Then the tails were brought around the cord and  sutured to each other lateral.  The wound was then lavaged with saline  and closed in layers with running 2-0 Vicryl for the external oblique, 2-0  Vicryl and 3-0 Vicryl for the deep fascia and subcu, Steri-Strips for skin.  Sterile absorbent dressings applied.  No complications.  Blood loss minimal.  Patient to the recovery room in good condition.                                               Gita Kudo, M.D.    MRL/MEDQ  D:  07/07/2004  T:  07/07/2004  Job:  [id number]   cc:   Soyla Murphy. Renne Crigler, M.D.  [address]  [place]  [place] [address]  Fax: [phone]

## 2011-05-11 NOTE — Op Note (Signed)
NAME:  Kevin [name], Kevin [name]                  ACCOUNT NO.:  [id number]   MEDICAL RECORD NO.:  [id number]          PATIENT TYPE:  INP   LOCATION:  6524                         FACILITY:  [facility]   PHYSICIAN:  Richard A. Alanda Amass, M.D.DATE OF BIRTH:  11-26-1925   DATE OF PROCEDURE:  05/17/2006  DATE OF DISCHARGE:                                 OPERATIVE REPORT   PROCEDURE:  1.  Implantation of biventricular ICD for primary prevention of sudden      cardiac death and symptomatic class 3-4 congestive heart failure with      recent hospitalization and left bundle branch block with documented left      ventricular dyssynchrony requiring CRT.  2.  Implantation of St. Jude Medical passive fixation bipolar coronary sinus      (left ventricular Quick Site XL number 1058T/86 cm St. Jude electrode,      active fixation screw in St. Jude Tendril ST number 1788TC/52 cm right      atrial electrode, bipolar in line coaxial St. Jude number 7040/65 cm      dual coil steroid eluding bipolar Riata-ST RV St. Jude electrode, St.      Jude Atlas ATF model number V/343 ICD, serial number [id number], coronary      sinus angiogram, DFT testing with rapid ventricular pacing for VF      induction.   IMPLANTING PHYSICIAN:  Richard A. Alanda Amass, M.D.   COMPLICATIONS:  None.   ESTIMATED BLOOD LOSS:  Approximately 75 mL.   ANESTHESIA:  5 mg Valium p.o. premedication, Ancef 1 grams IV antibiotics  prophylaxis preoperatively, 1% local Xylocaine, Versed 11 mg IV in divided  doses, fentanyl 225 mcg in divided doses, Nubain 5 mg IV in divided doses,  Zofran 4 mg IV in divided doses, Romazicon 0.4 mg IV at the end of the  procedure.   LV lead serial number [id number].  RA lead serial number [id number].  RV lead serial number [id number].   PREOPERATIVE DIAGNOSIS:  1.  Nonischemic congestive cardiomyopathy.  2.  Documented normal coronary arteries at catheterization June 2005.  3.  Left ventricular dysfunction with ejection fraction  29% by Cardiolite      November 2006, less than 30% by 2D echo April 2007.  4.  Recent hospitalization for congestive heart failure class 4 improved on      optimal medical therapy.  5.  History of depression on Effexor.  6.  Exogenous obesity.  7.  Sleep apnea on C Pap.  8.  Past history of gastrointestinal bleeding with negative gastrointestinal      workup, no recurrence.  9.  History of lumbar spinal stenosis with hospitalization May 2005, chronic      back pain, urinary retention at that time without recurrence.  10. History of total knee replacement.  11. Left and inguinal hernia repair by Dr. Ginette Pitman January 2006.   BRIEF HISTORY:  [name] is a 77 year old married father of one with two  grandchildren and one great grandchild who is a nonsmoker and has  nonischemic cardiomyopathy with underlying left bundle branch block sinus  rhythm and normal coronary arteries at catheterization June 2005.  He was  recently admitted to the hospital with overt congestive heart failure.  Medical therapy has been optimized and EF on 2D echo showed LV dyssynchrony  with LVBBB and EF less than 40%.  The procedures, risks and alternative  therapy to prophylactic ICD for primary prevention of sudden cardiac death  along with CRT therapy for severe congestive heart failure, LV dysfunction,  LVBBB were fully explained to the patient and his daughters and they agreed  to proceed.   DESCRIPTION OF PROCEDURE:  The patient was brought to the second floor CP  lab in a post absorptive state after 5 mg Valium p.o. premedication.  The  left anterior chest was prepped and draped in the usual manner.  1%  Xylocaine was used for local anesthesia.  The patient required considerable  sedation throughout the procedure that was given in divided doses.  It was a  relatively long difficult procedure because of difficulty with cannulation  of the coronary sinus requiring multiple guiding catheters and systemic   exchanges.  He tolerated the procedure well.   LV threshold:  Bipolar R equal 0.9 MV, resistance 323 ohms, threshold 0.7 V  at 0.5 milliseconds.   RA threshold:  P equal 2.1 MV, impedance 533 ohms, threshold 0.8 V at 0.5  milliseconds.   RV:  R equal 20.3 MV, resistance 730 ohms, threshold 0.4 V at 0.5  milliseconds.   A left infraclavicular transverse curvilinear incision was performed and  brought down through the subcutaneous tissue into the prepectoral fascia  using blunt dissection and electrocautery to control hemostasis.  There was  considerable venous oozing.  The patient had been on baby aspirin therapy.  A pulse generator pocket was performed using blunt dissection and hemostasis  was secured.  The subclavian vein was entered with two separate punctures  using number 18 thin-wall needle in the extrathoracic subclavian region  using fluoroscopic control.  An 8-French peel away Cook introducer was  inserted with the RA and RV leads inserted in tandem using exchange  technique with a retained guidewire and then the guidewire was removed.  A  slightly more medial stick was done as an 18 thin-wall needle and an 8-  [place] St. Jude CPS 115 long guiding catheter was inserted into the RV.  The  electrode was positioned in the RV apex and was stable, the RA electrode was  positioned in the right atrial appendage and screwed in under fluoroscopic  control.  During LV catheter manipulation, the RA electrode came out once,  it was repositioned using an atrial curved stylet in the right atrial  appendage, screwed in under fluoroscopic control, and remained stable  throughout the remainder of the procedure.  RV and RA thresholds were then  performed and were excellent as outlined above.  We were unable to  selectively catheterize the coronary sinus with a CPS-115 curved.  This was exchanged for a CPS-135 curved and then a CPS OCR curved.  A deflectable  catheter was also utilized but we  were unable to selectively catheterize the  coronary sinus with this.  We then removed this catheter and switched to a  9.5 [place] safe sheath which was flushed.  A Medtronic MB2 guiding catheter  was then relatively easily curved and advanced into the coronary sinus.  The  coronary sinus had a narrow os which may have made initial selective  catheterization difficult.  Coronary sinus angiography was done by  hand  injection through the guiding catheter.  There was also an area of relative  smooth narrowing before the moderate size posterolateral branch in the mid  coronary sinus.  A coronary sinus angiography was then done with a balloon  occlusion catheter with hand injection in several projections.  This  essentially revealed only a single posterolateral vein with an angled  takeoff and then a mid bifurcation.  There was a second high the  posterolateral artery or inferolateral vein in the distal portion of the  coronary sinus before the great cardiac vein and anterolateral branches.  Essentially there was only one suitable vein visualized on coronary sinus  angiography.  The coronary sinus balloon catheter was removed.  In the  midportion of the coronary sinus there was smooth 40% narrowing but no  definite valve was seen.  We had to selectively catheterize the  posterolateral vein with a 5-French special peripheral catheter through the  MB2 8.5 [place] guiding sheath.  Initially, I was able to pass a 0.014 inch  Asahi wire into the posterolateral vein but I was not able to get that out  far enough and the wire prolapsed out because of the proximal bend.  We then  had to selectively catheterize the posterolateral vein again.  The vein was  then entered with a 0.014 inch hydrophilic Choice PT2 wire and we were able  to negotiate the vein and this was positioned in the distal portion of the  posterolateral vein, actually almost down to the anastomosis with the  inferior cardiac vein for  stabilization.  The guiding sheath was then  advanced near the ostia and we were then able to advance a bipolar St. Jude  electrode to the distal portion of this vein near the posterior wall.  The  guidewire was pulled back and threshold testing was performed showing  excellent threshold.  Then, we pulled the guiding sheath back to the ostia  of the coronary sinus, split, and removed the 9.5 [place] safe sheath.  Using  a standard slitting technique, the MB2 sheath was then slit and removed with  good maintenance of position of the LV lead.  The RV and RA electrodes were  secured with a previously placed #1 figure-of-eight silk suture around  tissue sewing collar to prevent migration and control hemostasis.  Similarly, the LV lead was secured with a #1 figure-of-eight silk suture  around the tissue sewing collar to control hemostasis.  Each electrode was then further secured with two interrupted #1 silk sutures around silicone  sewing collar after the stylets were removed.  Fluoroscopy showed good  position of all three electrodes.  Threshold testing of the right RA was  again performed.  The generator was hooked to the electrodes in the proper  sequence identified by serial numbers and the two hex nuts tightened for  each electrode.  The generator was delivered into the pocket with the  electrodes looped behind and loosely secured to the underlying muscle and  fascia with #1 silk suture to prevent migration..  Threshold testing was  done through the device with LV threshold 0.5 volts, RV threshold 0.5 volts,  and RA threshold 0.75 volts at 0.5 milliseconds.  The device was loosely  secured to the underlying muscle fascia with #1 silk suture to prevent  migration.  The pocket was irrigated with 500 mg Kanamycin solution.  Avitene was used initially to help control hemostasis because of some venous  oozing.  The pocket was dry at  this time.  The subcutaneous tissue was  closed with two  separate running layers of 2-0 Vicryl suture and the skin  was closed with 5-0 subcuticular Vicryl suture.  VF induction was then done.  Initially this was done with RV pacing 400 milliseconds pacing train,  premature stimulus at 310, and a one joule shock on T.  This induced  ventricular fibrillation, however, this spontaneously broke to sinus rhythm.  We re-induced with burst ventricular pacing at 30 milliseconds.  VF was  induced appropriately sensed without drop out and the patient was rescued  with a single 20 joule biphasic shock through the implanted defibrillator  with impedance of 440 ohms.  The event was 4.2 seconds.  The patient has a  high output St. Jude device with 36 joules delivered.   The skin was closed with 4-0 subcuticular Vicryl suture and Steri-Strips  were applied.  Fluoroscopy showed good position of the RA, RV and coronary  sinus electrode.  This was a relatively long procedure and the patient  received considerable sedation and was quite resistant to this.  We did give  from Romazicon 0.4 mg at the end of the procedure for reversal of the  Versed.  He was awake on transfer to the holding area in stable condition.  He tolerated the procedure well.      Richard A. Alanda Amass, M.D.  Electronically Signed     RAW/MEDQ  D:  05/17/2006  T:  05/17/2006  Job:  [id number]   cc:   Soyla Murphy. Renne Crigler, M.D.  Fax: [phone]

## 2011-05-11 NOTE — Consult Note (Signed)
NAME:  Kevin [name], Kevin [name]                            ACCOUNT NO.:  [id number]   MEDICAL RECORD NO.:  [id number]                   PATIENT TYPE:  INP   LOCATION:  3014                                 FACILITY:  [facility]   PHYSICIAN:  Maretta Bees. Vonita Moss, M.D.             DATE OF BIRTH:  [birth year]   DATE OF CONSULTATION:  05/25/2003  DATE OF DISCHARGE:                                   CONSULTATION   REASON FOR CONSULTATION:  I was asked to see this 77 year old gentleman who  is well known to me with a past history of benign prostatic hypertrophy and  prostatitis and elevated PSA.  He also has a known left hydrocele.  He has  required some therapy with Flomax last year, but has been off it recently.  He also had a prostate ultrasound and biopsy done last November because of a  PSA of over 4, and biopsy was benign, and his prostatic volume was 48 cc.   He has now undergone significant surgery for lumbar stenosis and  spondylolisthesis, and that was done on May 18, 2003.  He has had trouble  voiding since then.  His Foley has been in and out, and it was last removed  this morning.  He said today he started voiding better, and feels like he is  much improved.  He denies hematuria or dysuria.   He is also on Cipro, but an urine culture from May 23, 2003, showed no  growth.   CHRONIC MEDICATIONS:  1. Lotensin for hypertension.  2. Effexor for depression.  3. Wellbutrin for depression.   PAST SURGICAL HISTORY:  Knee replacement in September 2003.   ALLERGIES:  MORPHINE.   HABITS:  He does not smoke or drink alcohol.   PHYSICAL EXAMINATION:  GENERAL:  He is alert and oriented.  SKIN:  Warm and dry.  He is in an upper body cast.  GENITOURINARY:  Penis, urethra, meatus, scrotum, testicles, and epididymis  are unremarkable except for skin change, left scrotal hydrocele.  Prostate  feels benign and smooth.   IMPRESSION:  Benign prostatic hypertrophy and prostatism with trouble  voiding  postoperatively.    PLAN:  I want a residual urine checked tonight with bladder scan or an in  and out cath.  If his residual urine is over 100 cc, he will need continued  catheterizations and get started on Flomax.  If his PVR is under 100 cc, he  can be discharged at the discretion of the neurosurgery service.                                               Maretta Bees. Vonita Moss, M.D.    LJP/MEDQ  D:  05/25/2003  T:  05/25/2003  Job:  [id number]  cc:   Clydene Fake, M.D.  [address]  [place]  [place] [address]  Fax: [phone]   Soyla Murphy. Renne Crigler, M.D.  [address]  [place]  [place] [address]  Fax: [phone]

## 2011-05-11 NOTE — Discharge Summary (Signed)
NAME:  Kevin [name], Kevin [name]                  ACCOUNT NO.:  [id number]   MEDICAL RECORD NO.:  [id number]          PATIENT TYPE:  INP   LOCATION:  4706                         FACILITY:  [facility]   PHYSICIAN:  Cristy Hilts. Jacinto Halim, MD       DATE OF BIRTH:  [birth year]   DATE OF ADMISSION:  08/28/2006  DATE OF DISCHARGE:  09/04/2006                                 DISCHARGE SUMMARY   SUBJECTIVE:  Kevin [name] is a 77 year old male patient who apparently awakened  the morning of his admission with complaints of headache, nausea, and he  vomited.  He apparently was last seen in his usual state about 7:30 a.m.  He  was later found unresponsive by family, who called EMS.  Upon arrival, the  patient was hypotensive, his blood pressure was 88 and he had a witnessed  seizure activity by EMS.  He was unresponsive after the seizure event and  was intubated and then transported to [facility].  He was put on dopamine  for low blood pressures in the 60s.  He was given IV bolus of crystalloids  2.5 liters and he was given a brief trial of Levophed.  He had a head CT  which was negative for any bleed or acute process.  His defibrillator was  interrogated and it did not show any shocks or arrhythmias.  The only other  thing that was noted at home was that his bottle of Ativan was empty,  unknown how much was in that bottle.  He was admitted.  Critical care  service followed him for his ventilation.  A neuro consult was called.  He  continued to improve over the next few days.  On August 29, 2006, he was  extubated.  His BiV AICD was again interrogated by Dr. Susa Griffins.  He had increased bipolar LV threshold and he also had increased unipolar LV  threshold.  It was thought maybe he had a lead dislodgement.  His chest x-  ray showed the lead was still in a posterolateral position.  Dr. Alanda Amass  discussed the case with Dr. Ladona Ridgel since it was still capturing, despite  increased threshold.  It was decided that medical  therapy would be continued  and the indices would be followed and no lead repositioning at this time.  On August 30, 2006, pulmonary signed off.  The patient had been extubated.  He was having excellent oxygenation.   THE ETIOLOGY:  He had an EEG and it was negative.  It was then thought that  the etiology of his seizure might be his Wellbutrin which was discontinued.  He still had bouts of confusion, seem so more at bedtime.  He continued to  progress and he was mobilized.  Dr. Sandria Manly from neurology on September 04, 2006, signed off, suggested continued seizure precautions and no driving for  at least three months and after discharge, he also is to follow up with Dr.  Sandria Manly in the office.  On September 04, 2006, he was seen by Dr. Jacinto Halim and  considered stable for discharge home with  no etiology found for his  unresponsiveness at the time of discharge.   DISCHARGE LABS:  Sodium 142, potassium 3.7, BUN was 16, creatinine 1.2, AST  was 22, ALT was 16.  His CK-MBs were negative, troponins were negative.  His  BNP was 245 and 395; it went up to 1053 on August 31, 2006; it came back  down to 250 on September 04, 2006.  Iron was 12.  TIBC was 252.  Percent sat  was 5 liters.  B12 was 245.  TSH was 0.752.  Cortisol was 25.3.  His dig  level was less than 0.2.  Benzodiazepines in his urine were positive.  Urine  was  without any pathology.  His blood gases on August 29, 2006, showed a  PO2 of 86.7.  Bicarb was 22.5.  CO2 was 24.8; this was while he was still  ventilated.  pH was 7.33.  PCO2 was 45.8.   His EKG showed pacing.   On August 28, 2006, a CT without contrast showed no acute intracranial  abnormality, atrophy.  On August 28, 2006, portable chest showed prominent  mediastinal and bi-hilar contours, left lower-lobe bronchial wall  thickening, bronchiectasis, possible airspace disease and cardiomegaly.  Chest x-ray on August 28, 2006, showed right IJ central line placement   without pneumothorax.   DISCHARGE MEDICATIONS:  1. Protonix 40 mg one time per day.  2. Lotensin 20 mg one time per day.  3. Potassium chloride 20 mEq one time per day.  4. Digoxin 0.125 mg one time per day,.  5. Lasix 40 mg two times per day.  6. Chloride 12.5 mg two times per day.  7. Effexor 75 mg two in the morning, one in the p.m.  8. Lorazepam 0.5 two times per day.  9. Baby aspirin one time per day.   He should follow up with Dr. Alanda Amass in about two to three weeks and  follow up with Dr. Sandria Manly on a p.r.n. basis.   DISCHARGE DIAGNOSES:  1. Seizure, possibly related to Wellbutrin.  He had a negative EEG,      negative CT scan of his head for any acute processes.  2. Unresponsiveness after seizure, no etiology.  Patient had to be      intubated in the field and he was extubated the following day.  3. Nonischemic cardiomyopathy; EF of 25% to 35%.  4. History of BiV AICD 5/07 with questionable lead dislodgement and was      noted to have high thresholds; however, medical therapy is recommended      because it was still capturing.  Dr. Alanda Amass discussed the case with      Dr. Ladona Ridgel.  5. History of depression with questionable Ativan overdose, this is not      certain.  The family noted that his bottle of Ativan was empty.  6. History of sleep apnea.  Wear his CPAP.  7. Hypertension.  8. History of anemia.  9. Hypotension and shock on admission.  10.Left bundle branch block.      Kevin [name], N.P.      Cristy Hilts. Jacinto Halim, MD  Electronically Signed    BB/MEDQ  D:  09/23/2006  T:  09/24/2006  Job:  [id number]   cc:   Genene Churn. Love, M.D.  [facility]

## 2011-05-11 NOTE — Op Note (Signed)
NAME:  Kevin [name], Kevin [name]                            ACCOUNT NO.:  [id number]   MEDICAL RECORD NO.:  [id number]                   PATIENT TYPE:  INP   LOCATION:  2550                                 FACILITY:  [facility]   PHYSICIAN:  Clydene Fake, M.D.               DATE OF BIRTH:  June 29, 1925   DATE OF PROCEDURE:  05/18/2003  DATE OF DISCHARGE:                                 OPERATIVE REPORT   PREOPERATIVE DIAGNOSIS:  Lumbar stenosis, unstable spondylolisthesis.   POSTOPERATIVE DIAGNOSIS:  Lumbar stenosis, unstable spondylolisthesis.   OPERATION PERFORMED:  Gill decompressive laminectomy, L3-4 with  decompressive laminectomies at L2-3 and L4-5, posterior interbody fusion at  L3-4 with Brantigan interbody cages 3-4, segmented pedicle screw fixation  from L2 to L4, posterolateral fusion L2 to L5, three levels, autografts from  incision, allograft, Symphony.   SURGEON:  Clydene Fake, M.D.   ASSISTANT:  Payton Doughty, M.D.   ANESTHESIA:  General endotracheal.   ESTIMATED BLOOD LOSS:  .   FLUIDS REPLACED:  Cell Saver given back.  No other blood given.   INDICATIONS FOR PROCEDURE:  The patient is a 77 year old gentleman who for  the last two months has had severe back pain, leg pain worse on the right,  problems walking, only can walk a few yards.  He has to stop to rest.  Pain  radiates down the right leg to anterior shin, left leg a little more  nondescript.  MRI and x-rays, flexion and extension of the lumbar spine done  showing severe stenosis at L3-4, L4-5 and moderate L3-4 with anterior  spondylolisthesis at L3-4, also scoliosis that does move between flexion and  extension and there is also some movement between flexion and extension at  L2-3 and L4-5.  The patient was brought in for decompression and fusion of  his lumbar spine.   DESCRIPTION OF PROCEDURE:  The patient was brought to the operating room and  general anesthesia induced.  Patient was placed in prone  position on the  Wilson frame with all pressure points padded. The patient was prepped and  draped in a sterile fashion.  The site of incision was injected with 10mL of  1% lidocaine with epinephrine.  Incision was then made from bottom of L1  spinous process to L5.  Incision taken down to the fascia.  Hemostasis  obtained with Bovie cauterization.  The fascia was incised with Bovie and  subperiosteal dissection was done over the spinous process and lamina out to  the facets bilaterally.  We got fluoroscopic imaging to confirm our position  and the transverse processes of L2, L3, L4 and L5 were then exposed  bilaterally.  Self-retaining retractor was then placed again confirming  level with fluoroscopy.  Decompressive laminectomy was performing removing  the bottom of L2, L3, L4, and top of L5 decompressing L2-3, L3-4, L4-5.  This was done  with Leksell rongeurs and pituitary rongeurs and this was done  with various sizes.  We then removed facet and pars at the L3-4  decompressing the major stenosis that was there, decompressing the nerve  roots coming out laterally.  When we finished there was good decompression  L2-3, L3-4, L4-5.  All the nerve roots seemed to be getting out.  Then we  explored the disk spaces of L2-3 and L4-5 and there was some disk bulge, but  with the decompression, we thought it was best to leave the disk spaces  intact.  At L3-4 disk space was explored and hemostasis was obtained with  Bovie cauterization and disk space was incised with 15 blade and diskectomy  was performed with pituitary rongeurs and curets on each side.  We then  distracted the interspace up to 11 mm bilaterally reducing the  spondylolisthesis and the scoliosis.  We continued removing disk and  prepared the interspace using the broaches used for the instrumentation.  A  211 __________ autograft bone removed during laminectomy and it was cleaned  and chopped up into small pieces and then put in the  Symphony system, the  platelet rich plasma and this mixture was then packed into the cages.  Bone  was placed in the 3-4 interspace and tapped in place.  This was repeated on  the opposite side.  Attention was then taken to the transverse processes  where they were decorticated along the facets and the rest of the autograft  bone mixture was placed in the left posterolateral gutters and another log  with the Symphony system was made with allograft bone.  This was also packed  in posterolateral gutter from L2 to L5.  This was done bilaterally.  Attention was then taken to the pedicle.  Using fluoroscopic imaging as a  guide, a high speed drill was used to decorticate.  Pedicle probe was placed  down the pedicle.  We then probed the hole, tapped the hole and probed the  hole and drilled making sure that there  were bony sides all the way around.  We then placed a Monarch pedicle screw.  At L2, L3, and L4, 15 mm screws x  64 in diameter were placed bilaterally at L5.  A 7 mm screw by 45 mm length  was used bilaterally.  Then fluoroscopic imaging was used to confirmed  positioning.  A rod was placed over the screw heads and both sides locking  caps were placed.  The caps were final tightened.  We did compress  bilaterally over the L3-4 interspace but not over the others.  Then final AP  and lateral fluoroscopic images were obtained showing reduction  spondylolisthesis and scoliosis.  Good position of interbody cages and  pedicle screws and rods.  Retractors were removed.  Hemostasis obtained with  Bovie cauterization.  Paraspinous muscles were closed with 0 Vicryl  interrupted sutures.  The fascia was closed with the same. Subcutaneous  tissue closed with 0, 2-0 and 3-0 Vicryl interrupted suture.  Skin closed  with Benzoin and Steri-Strips.  Dressing was placed and the patient was  placed back in supine position, awakened from anesthesia and transferred to  recovery in stable  condition.                                                Fayrene Fearing  Mickeal Skinner, M.D.    JRH/MEDQ  D:  05/18/2003  T:  05/19/2003  Job:  [id number]

## 2011-05-11 NOTE — Discharge Summary (Signed)
[facility]  Patient:    Kevin [name], Kevin [name]                           MRN: [id number] Adm. Date:  [id number] Disc. Date: [id number] Attending:  Denny Peon                           Discharge Summary  INTRODUCTION:  Traver Meckes is a 77 year old white married male who was admitted in involuntary papers on April 24 due to depression with suicidal ideations. Patient was recently hospitalized on our unit and he was discharged in good condition end of March of 2002.  Shortly after discharge he stopped taking his medication.  Initially on the ward, he was very depressed, but when medications were resumed he improved rapidly.  Patient after a few days denied suicidal ideation, was able to promise safety, and agreed to sign voluntary papers.  He seemed to gain this time some better insight into his condition.  Medically, there were no medical problems.  Patients vital signs were stable, with blood pressure ranging from 130/86 or 90 with no orthostatic blood pressure drop.  Pulse, respiration rate and temperature were normal. Patients weight done on April 26 was 179 pounds.  On the day of discharge, patient presented with bright, pleasant affect, no dangerous ideations, improved insight and judgment, wants to follow up with Dr. Katrinka Blazing after discharge, and was able to promise safety.  Patient recognized that he has to be more active and that staying in bed will quickly bring him back to previous depressive state.  During this hospitalization no blood work was done since he had blood work during the previous hospital stay.  DISCHARGE DIAGNOSIS: Axis I:     Major depression, recurrent, moderate to severe, improved. Axis II:    No diagnosis. Axis III:   Arterial hypertension. Axis IV:    Mild to moderate stressors related to family conflict. Axis V:     Global assessment of function upon admission was 30, maximum for             past year 60, at time of discharge  55.  DISCHARGE RECOMMENDATIONS:  Patient has an appointment with Dr. Katrinka Blazing and he is supposed to follow with him as recommended.  Prescription was given for Pamelor 10 mg 3 times a day, Zoloft 100 mg 1/2 tablet daily, and Zyprexa 5 mg at night, Lotensin 20 mg daily for treatment of blood pressure.  Family should help this patient with compliance with medication by using pill box, some supervision, etc., and helping patient to stay active.  He should call Dr. Katrinka Blazing if any problems with medication or recurrence of symptoms. DD:  04/21/01 TD:  04/21/01 Job: [id number]

## 2011-05-11 NOTE — Discharge Summary (Signed)
NAME:  Kevin [name], Kevin [name]                              ACCOUNT NO.:  [id number]   MEDICAL RECORD NO.:  [id number]                    PATIENT TYPE:  I   LOCATION:                                       FACILITY:  [facility]   PHYSICIAN:  Kevin [name], M.D.              DATE OF BIRTH:  March 22, 1925   DATE OF ADMISSION:  08/31/2002  DATE OF DISCHARGE:  09/09/2002                                 DISCHARGE SUMMARY   ADMISSION DIAGNOSIS:  Advanced degenerative joint disease of the left knee.   DISCHARGE DIAGNOSES:  1. Advanced degenerative joint disease of the left knee.  2. Postoperative anemia.  3. Hypertension.  4. Hypokalemia.  5. Depression.   PROCEDURE:  Left total knee replacement.   HISTORY:  [age]-year-old male with progressive DJD of the left knee.  He has tried conservative treatment including cortisone injections which  were only very temporary.  He is now having difficulty with ambulation to  the point where he falls because of knee mechanical symptoms.  He is now  indicated for left total knee replacement.   HOSPITAL COURSE:  [age]-year-old white male admitted August 31, 2002 and after appropriate laboratory studies were obtained, as well as 1 g  of Ancef IV on call to the operating room, he was taken to the operating  room where he underwent a left total knee replacement.  He tolerated the  procedure well.  He was continued postoperatively on Ancef 1 g IV q.8h. x3  doses.  Heparin 5000 units subcut. q.12h. was started and continued until  his Coumadin became therapeutic.  He had an epidural for postoperative pain  control.  Consultations with PT, OT and rehab were obtained, physical  therapy for ambulation weightbearing as tolerated.  Foley was placed  intraoperatively.  CPM was placed postoperatively from 0 to 30 degrees for 8  to 10 hours per day and was increased by 10 degrees a day.  He was continued  on his medicines as noted previously.  He was continued  on a clear liquid  diet until his bowel sounds returned.  He did have some difficulties with  postop pain management and the epidural was discontinued and he was placed  on a morphine PCA protocol, full dose.  He was given 1 mg of Ativan q.6h.  p.r.n.  Chest x-ray was ordered on September 02, 2002.  He was weaned to  oral pain medicines.  He was followed by Dr. Soyla [name]. Kevin [name] from a medical  standpoint.  He had ordered spirometry testing as well as an albuterol  nebulizer q.i.d.  A complete pulmonary function test was also ordered.  He  did have difficulties with wheezing and on the 10th of September, a STAT  chest x-ray was ordered by Dr. Lacretia Nicks. Candelaria [name] and the patient was given 40 mg  of Lasix IV.  He continued  with medical coverage by Dr. Renne [name].  The patient  was transfused two units of packed cells with 10 mg of Lasix between.  Kevin [name], which was used earlier, was discontinued and Kevin [name] 5 mg nightly  was used for agitation.  The remainder of his hospital course was  unremarkable except for the need to restrain p.r.n.  They were able to  control his anxiety and restlessness.  Hypokalemia was corrected with oral  potassium.  The remainder of his hospital course, he improved with his  physical therapy and he was finally discharged on the 17th to return back to  the office in one week for staple removal.   LABORATORY AND ACCESSORY CLINICAL DATA:  EKG showed normal sinus rhythm with  nonspecific T wave abnormality.   Portable chest x-ray of September 02, 2002 reveals under-penetrated film  with lower lobe atelectasis, more marked on the left.  Chest x-ray of  September 02, 2002 compared to prior study did have low lung volumes and  patchy opacities of both lung bases probably reflecting subsegmental  atelectatic changes.  Knee films of September 01, 2002 reveal immediately  status post left total knee replacement without conventional radiographic  evidence of complicating feature.    Laboratory studies:  Admitted with a hemoglobin of 15.5, hematocrit 44.8%,  white count 6500, platelets 294,000.  Discharge hemoglobin 9.0, hematocrit  26.1%.  Chemistries preop:  Sodium 136, potassium 4.0, chloride 104, CO2 26,  glucose 99, BUN 14, creatinine 1.1, calcium 9.6, total protein 6.6, albumin  3.6, AST 30, ALT 36, ALP 74 and total bilirubin 0.5.  Discharge chemistries:  Sodium 143, potassium 3.9, chloride 112, CO2 26, glucose 100, BUN 12,  creatinine 1.1, calcium 8.9, total protein 5.3, albumin 2.3, AST 79, ALT 83,  ALP 199, total bilirubin 0.5.  PSA was 6.63.  Urinalysis showed 15 ketones,  urobilinogen 0.2, otherwise, benign.   DISCHARGE MEDICATIONS:  1. He was given a prescription for Norco 5/325 mg one tab q.4h. p.r.n. pain.     He may take two tabs every four hours for severe pain.  2. Coumadin 4 mg as directed by Kevin [name] pharmacist.  3. Iron sulfate 325 mg one tablet daily with meals.  4. Colace 100 mg one tab twice a day for stool softener.   ACTIVITY:  He was to continue activities as taught in physical therapy.   DIET:  No diet restrictions.   WOUND CARE:  Keep the wound clean and dry.   DISCHARGE INSTRUCTIONS:  He will call back if he has increasing fever or  bleeding.   FOLLOWUP:  Follow back up in the office in one week for followup, Dr. Virginia [name] at  [facility] for elevated liver enzymes per Dr. Renne [name] and they will arrange urology  followup for evaluation of his prostate.   CONDITION ON DISCHARGE:  He was discharged in improved condition.       Kevin [name], P.A.-C.                Kevin [name], M.D.    BDP/MEDQ  D:  10/17/2002  T:  10/19/2002  Job:  [id number]

## 2011-05-11 NOTE — Discharge Summary (Signed)
[facility]  Patient:    Kevin [name], Kevin [name]                           MRN: [id number] Adm. Date:  [id number] Disc. Date: [id number] Attending:  Denny Peon Dictator:   Candi Leash. Orsini, N.P.                           Discharge Summary  HISTORY OF PRESENT ILLNESS:  Patient is a 77 year old white married male admitted with symptoms of depression with feeling of having a lack of energy, lack of appetite, losing weight, and losing drive to live.  Patient was having suicide ideations planning to starve himself.  The patient also reports some insomnia, racing thoughts, with a 30-pound weight loss, feeling unneeded, weak, and sickly.  He has had previous hospitalizations and several suicide attempts by overdose.  He had a history of ECT 15 years ago and on an outpatient basis sees Dr. Katrinka Blazing.  PAST MEDICAL HISTORY:  Hypertension, chronic back pain.  DRUG ALLERGIES:  No known drug allergies.  MENTAL STATUS EXAMINATION:  He is a tall strongly built elderly white male with poor eye contact, decreased motor activity, slow speech, speech somehow hesitant and slow.  No hallucinations.  Report racing thoughts.  Affect was blunted almost flat.  Thoughts with slow pace, preoccupied with thoughts to give up and die, thoughts of being a burden.  No delusions.  No ideas of reference.  Suicidal thoughts: He planned to starve himself to death.  No symptoms of OCD or bipolarity.  Alert and oriented x 3.  Fair memory but decreased concentration.  Severely impaired insight and judgment.  Poor reliability.  Average intelligence.  ADMITTING DIAGNOSES: Axis I:    Major depression - recurrent, severe. Axis II:   No diagnosis. Axis III:  1. Arterial hypertension.            2. Chronic arteriosclerosis. Axis IV:   Moderate with problems relating to primary support group and            medical problems. Axis V:    Current Global Assessment of Functioning is 25, maximum is  60.  HOSPITAL COURSE:  The plan is to monitor the patient q.43m.  We will change his medication, add Pamelor twice a day and increase his Zyprexa and introduce Zoloft and obtain a family session to help patient deal with his difficulties. We will also order nutritional supplements and consult with a nutritionist for his nutritional status.  Patient continued still wanting to die.  His appetite was poor.  We went and increased his Pamelor.  Patient was remaining in the bed and refusing to eat.  Patient was encouraged to participate in group activities.  Patient was improved considerably, was out of bed, and his affect appeared much brighter.  CONDITION UPON DISCHARGE:  Patient was feeling much better.  He was out of the bed.  His affect was bright.  He was eating better.  He no longer felt that he wanted to die.  His vital signs were stable.  It was felt that patient could be managed on an outpatient basis.  Patient was to be discharged to home and was discharged with his wife.  FOLLOWUP:  He was to followup with Dr. Elna Breslow with an appointment and phone number provided.  He was to call Dr. Elna Breslow for any problems with his medications or  dangerous thoughts.  He was to call if he was having any dizziness while on his medication.  DISCHARGE MEDICATIONS: 1. Pamelor 10 mg one three times a day. 2. Zyprexa 2.5 one q.h.s. 3. Zoloft 100 mg one-half in the morning. 4. Ativan 0.5 mg three times daily if needed for anxiety. 5. Lotensin 20 mg q.d.  DISCHARGE DIAGNOSES: Axis I:    Major depression - recurrent, severe. Axis II:   No diagnosis. Axis III:  1. Arterial hypertension.            2. Chronic arteriosclerosis. Axis IV:   Moderate with problems related to primary support group and medical            problems. Axis V:    Current Global Assessment of Functioning is 55, this past year 60. DD:  04/03/01 TD:  04/03/01 Job: [id number]

## 2011-09-24 LAB — POCT I-STAT, CHEM 8
Chloride: 107
HCT: 42
Potassium: 4

## 2011-09-24 LAB — ETHANOL: Alcohol, Ethyl (B): <5

## 2011-09-24 LAB — URINALYSIS, ROUTINE W REFLEX MICROSCOPIC
Glucose, UA: NEGATIVE
Hgb urine dipstick: NEGATIVE
Protein, ur: NEGATIVE

## 2011-09-24 LAB — BASIC METABOLIC PANEL
BUN: 15
CO2: 30
Chloride: 103
Glucose, Bld: 101 — ABNORMAL HIGH
Potassium: 3.7
Sodium: 139

## 2011-09-24 LAB — RAPID URINE DRUG SCREEN, HOSP PERFORMED
Amphetamines: NOT DETECTED
Barbiturates: NOT DETECTED
Benzodiazepines: POSITIVE — AB
Tetrahydrocannabinol: NOT DETECTED

## 2011-09-24 LAB — CBC
HCT: 41
Hemoglobin: 13.9
MCHC: 33.9
MCHC: 33.8
MCV: 92.9
Platelets: 263
RBC: 4.52
RDW: 13.8
RDW: 13.9

## 2011-09-24 LAB — DIFFERENTIAL
Basophils Absolute: 0
Basophils Relative: 1
Lymphocytes Relative: 25
Monocytes Relative: 7
Neutro Abs: 5.1
Neutrophils Relative %: 63

## 2011-09-24 LAB — HEPATIC FUNCTION PANEL
Indirect Bilirubin: 0.7
Total Protein: 6.7

## 2011-09-24 LAB — SALICYLATE LEVEL: Salicylate Lvl: <4

## 2011-09-26 LAB — DIFFERENTIAL
Basophils Absolute: 0
Basophils Relative: 1
Eosinophils Absolute: 0.4
Eosinophils Absolute: 0.4
Eosinophils Relative: 7 — ABNORMAL HIGH
Eosinophils Relative: 7 — ABNORMAL HIGH
Lymphs Abs: 1.9
Monocytes Absolute: 0.4
Monocytes Absolute: 0.5
Monocytes Relative: 8

## 2011-09-26 LAB — BASIC METABOLIC PANEL
BUN: 12
BUN: 15
CO2: 28
CO2: 29
Calcium: 10
Chloride: 104
Creatinine, Ser: 0.96
GFR calc Af Amer: >60
GFR calc non Af Amer: >60
Glucose, Bld: 96
Potassium: 3.6

## 2011-09-26 LAB — CARDIAC PANEL(CRET KIN+CKTOT+MB+TROPI)
CK, MB: 10 — ABNORMAL HIGH
CK, MB: 8.6 — ABNORMAL HIGH
Relative Index: INVALID
Troponin I: 0.02
Troponin I: 0.02
Troponin I: 0.04

## 2011-09-26 LAB — URINALYSIS, ROUTINE W REFLEX MICROSCOPIC
Nitrite: NEGATIVE
Specific Gravity, Urine: 1.02
pH: 6

## 2011-09-26 LAB — POCT CARDIAC MARKERS
CKMB, poc: 4.9
Myoglobin, poc: 109
Troponin i, poc: <0.05

## 2011-09-26 LAB — COMPREHENSIVE METABOLIC PANEL
ALT: 17
AST: 27
CO2: 30
Calcium: 9.8
Chloride: 102
GFR calc Af Amer: >60
GFR calc non Af Amer: >60
Sodium: 137

## 2011-09-26 LAB — CK TOTAL AND CKMB (NOT AT ARMC)
CK, MB: 13.6 — ABNORMAL HIGH
Relative Index: INVALID

## 2011-09-26 LAB — DIGOXIN LEVEL: Digoxin Level: <0.2 — ABNORMAL LOW

## 2011-09-26 LAB — CBC
HCT: 44.2
Hemoglobin: 14.9
MCHC: 33.6
MCHC: 34.2
MCV: 94.4
RBC: 4.69
RDW: 13.7
WBC: 6

## 2011-09-26 LAB — TROPONIN I: Troponin I: 0.04

## 2011-09-26 LAB — LIPID PANEL: VLDL: 16

## 2011-10-01 LAB — CREATININE, SERUM
GFR calc Af Amer: >60
GFR calc non Af Amer: >60

## 2011-10-10 LAB — CBC
HCT: 31.4 — ABNORMAL LOW
Hemoglobin: 9.7 — ABNORMAL LOW
MCHC: 32.5
MCV: 77.3 — ABNORMAL LOW
Platelets: 345
RBC: 3.88 — ABNORMAL LOW
RBC: 4.05 — ABNORMAL LOW
WBC: 4.9
WBC: 5.1

## 2011-10-10 LAB — BASIC METABOLIC PANEL
BUN: 8
CO2: 27
CO2: 28
Calcium: 9.3
Chloride: 106
Chloride: 106
Creatinine, Ser: 1.11
GFR calc Af Amer: >60
GFR calc Af Amer: >60
GFR calc Af Amer: >60
GFR calc non Af Amer: >60
Glucose, Bld: 99
Potassium: 4.4
Potassium: 4.5
Sodium: 138
Sodium: 139
Sodium: 139

## 2011-10-10 LAB — HEMOGLOBIN AND HEMATOCRIT, BLOOD
HCT: 34.5 — ABNORMAL LOW
Hemoglobin: 11.1 — ABNORMAL LOW

## 2011-10-11 LAB — URINE CULTURE
Colony Count: 8000
Colony Count: NO GROWTH
Culture: NO GROWTH

## 2011-10-11 LAB — URINALYSIS, ROUTINE W REFLEX MICROSCOPIC
Bilirubin Urine: NEGATIVE
Hgb urine dipstick: NEGATIVE
Ketones, ur: 15 — AB
Ketones, ur: NEGATIVE
Nitrite: POSITIVE — AB
Protein, ur: NEGATIVE
Urobilinogen, UA: 1
pH: 5

## 2011-10-11 LAB — URINE MICROSCOPIC-ADD ON

## 2011-10-11 LAB — TSH: TSH: 2.439

## 2011-10-11 LAB — I-STAT 8, (EC8 V) (CONVERTED LAB)
BUN: 15
Chloride: 104
pCO2, Ven: 44.5 — ABNORMAL LOW
pH, Ven: 7.431 — ABNORMAL HIGH

## 2011-10-11 LAB — BASIC METABOLIC PANEL
CO2: 29
CO2: 29
Chloride: 105
Chloride: 108
GFR calc Af Amer: >60
GFR calc Af Amer: >60
Potassium: 4.1
Sodium: 137
Sodium: 140

## 2011-10-11 LAB — URINALYSIS, MICROSCOPIC ONLY
Bilirubin Urine: NEGATIVE
Hgb urine dipstick: NEGATIVE
Ketones, ur: NEGATIVE
Nitrite: NEGATIVE
pH: 6.5

## 2011-10-11 LAB — COMPREHENSIVE METABOLIC PANEL
ALT: 14
AST: 21
Albumin: 3.3 — ABNORMAL LOW
CO2: 25
Calcium: 8.9
Chloride: 104
GFR calc Af Amer: >60
GFR calc non Af Amer: >60
Sodium: 134 — ABNORMAL LOW

## 2011-10-11 LAB — POCT CARDIAC MARKERS
CKMB, poc: <1 — ABNORMAL LOW
Myoglobin, poc: 134
Operator id: 146091
Troponin i, poc: <0.05

## 2011-10-11 LAB — CARDIAC PANEL(CRET KIN+CKTOT+MB+TROPI)
CK, MB: 3.7
CK, MB: 4.1 — ABNORMAL HIGH
Troponin I: 0.02

## 2011-10-11 LAB — TROPONIN I: Troponin I: 0.02

## 2011-10-11 LAB — FERRITIN: Ferritin: 7 — ABNORMAL LOW (ref 22–322)

## 2011-10-11 LAB — DIFFERENTIAL
Basophils Absolute: 0
Eosinophils Absolute: 0.5
Eosinophils Relative: 9 — ABNORMAL HIGH
Lymphocytes Relative: 32
Lymphs Abs: 1.9
Monocytes Absolute: 0.5

## 2011-10-11 LAB — VITAMIN B12: Vitamin B-12: 187 — ABNORMAL LOW (ref 211–911)

## 2011-10-11 LAB — IRON AND TIBC
Iron: 31 — ABNORMAL LOW
UIBC: 274

## 2011-10-11 LAB — CBC
HCT: 34.5 — ABNORMAL LOW
Hemoglobin: 11.2 — ABNORMAL LOW
MCV: 75.6 — ABNORMAL LOW
Platelets: 370
RDW: 18 — ABNORMAL HIGH

## 2011-10-11 LAB — POCT I-STAT CREATININE: Creatinine, Ser: 1.2

## 2011-10-11 LAB — B-NATRIURETIC PEPTIDE (CONVERTED LAB): Pro B Natriuretic peptide (BNP): 333 — ABNORMAL HIGH

## 2011-10-11 LAB — FOLATE: Folate: 16.9

## 2012-06-27 ENCOUNTER — Inpatient Hospital Stay (HOSPITAL_COMMUNITY)
Admission: EM | Admit: 2012-06-27 | Discharge: 2012-07-01 | DRG: 378 | Disposition: A | Discharge status: Skilled Nursing Facility | Payer: Medicare Other | Attending: Internal Medicine | Admitting: Internal Medicine

## 2012-06-27 ENCOUNTER — Encounter (HOSPITAL_COMMUNITY): Payer: Self-pay

## 2012-06-27 DIAGNOSIS — G4700 Insomnia, unspecified: Secondary | ICD-10-CM | POA: Diagnosis present

## 2012-06-27 DIAGNOSIS — R339 Retention of urine, unspecified: Secondary | ICD-10-CM | POA: Diagnosis present

## 2012-06-27 DIAGNOSIS — F0390 Unspecified dementia without behavioral disturbance: Secondary | ICD-10-CM | POA: Diagnosis present

## 2012-06-27 DIAGNOSIS — Z95810 Presence of automatic (implantable) cardiac defibrillator: Secondary | ICD-10-CM

## 2012-06-27 DIAGNOSIS — R338 Other retention of urine: Secondary | ICD-10-CM

## 2012-06-27 DIAGNOSIS — F329 Major depressive disorder, single episode, unspecified: Secondary | ICD-10-CM | POA: Diagnosis present

## 2012-06-27 DIAGNOSIS — D649 Anemia, unspecified: Secondary | ICD-10-CM | POA: Diagnosis not present

## 2012-06-27 DIAGNOSIS — Z7401 Bed confinement status: Secondary | ICD-10-CM

## 2012-06-27 DIAGNOSIS — K449 Diaphragmatic hernia without obstruction or gangrene: Secondary | ICD-10-CM | POA: Diagnosis present

## 2012-06-27 DIAGNOSIS — K920 Hematemesis: Secondary | ICD-10-CM

## 2012-06-27 DIAGNOSIS — D62 Acute posthemorrhagic anemia: Secondary | ICD-10-CM | POA: Diagnosis present

## 2012-06-27 DIAGNOSIS — G4730 Sleep apnea, unspecified: Secondary | ICD-10-CM | POA: Diagnosis present

## 2012-06-27 DIAGNOSIS — Z7982 Long term (current) use of aspirin: Secondary | ICD-10-CM

## 2012-06-27 DIAGNOSIS — F3289 Other specified depressive episodes: Secondary | ICD-10-CM | POA: Diagnosis present

## 2012-06-27 DIAGNOSIS — I10 Essential (primary) hypertension: Secondary | ICD-10-CM | POA: Diagnosis present

## 2012-06-27 DIAGNOSIS — I428 Other cardiomyopathies: Secondary | ICD-10-CM | POA: Diagnosis present

## 2012-06-27 DIAGNOSIS — E669 Obesity, unspecified: Secondary | ICD-10-CM | POA: Diagnosis present

## 2012-06-27 DIAGNOSIS — K922 Gastrointestinal hemorrhage, unspecified: Principal | ICD-10-CM | POA: Diagnosis present

## 2012-06-27 HISTORY — DX: Obesity, unspecified: E66.9

## 2012-06-27 HISTORY — DX: Presence of automatic (implantable) cardiac defibrillator: Z95.810

## 2012-06-27 HISTORY — DX: Depression, unspecified: F32.A

## 2012-06-27 HISTORY — DX: Vitamin D deficiency, unspecified: E55.9

## 2012-06-27 HISTORY — DX: Urinary tract infection, site not specified: N39.0

## 2012-06-27 HISTORY — DX: Major depressive disorder, single episode, unspecified: F32.9

## 2012-06-27 HISTORY — DX: Sleep apnea, unspecified: G47.30

## 2012-06-27 HISTORY — DX: Unspecified dementia, unspecified severity, without behavioral disturbance, psychotic disturbance, mood disturbance, and anxiety: F03.90

## 2012-06-27 HISTORY — DX: Insomnia, unspecified: G47.00

## 2012-06-27 HISTORY — DX: Essential (primary) hypertension: I10

## 2012-06-27 MED ORDER — SODIUM CHLORIDE 0.9 % IV SOLN
80.0000 mg | Freq: Once | INTRAVENOUS | Status: AC
  Administered 2012-06-28: 80 mg via INTRAVENOUS
  Filled 2012-06-27 (×2): qty 80

## 2012-06-27 MED ORDER — SODIUM CHLORIDE 0.9 % IV SOLN
8.0000 mg/h | INTRAVENOUS | Status: DC
  Administered 2012-06-28: 8 mg/h via INTRAVENOUS
  Filled 2012-06-27 (×4): qty 80

## 2012-06-27 MED ORDER — ONDANSETRON HCL 4 MG/2ML IJ SOLN
4.0000 mg | Freq: Once | INTRAMUSCULAR | Status: AC
  Administered 2012-06-28: 4 mg via INTRAVENOUS
  Filled 2012-06-27: qty 2

## 2012-06-27 NOTE — ED Notes (Signed)
Pt states he has been vomiting x3-4 hrs, c/o nausea. Denies any pain. Denies any similar hx in past. Pt states he does take coumadin.

## 2012-06-27 NOTE — ED Notes (Signed)
Per ems- pt from [facility] c/o coffee ground emesis x3 for the past 3-4 hours. VSS. Pt has hx of cdiff. Pt denies any pain.

## 2012-06-28 ENCOUNTER — Encounter (HOSPITAL_COMMUNITY): Payer: Self-pay | Admitting: Internal Medicine

## 2012-06-28 ENCOUNTER — Encounter (HOSPITAL_COMMUNITY)
Admission: EM | Disposition: A | Discharge status: Skilled Nursing Facility | Payer: Self-pay | Source: Home / Self Care | Attending: Internal Medicine

## 2012-06-28 DIAGNOSIS — I10 Essential (primary) hypertension: Secondary | ICD-10-CM | POA: Diagnosis present

## 2012-06-28 DIAGNOSIS — K920 Hematemesis: Secondary | ICD-10-CM | POA: Diagnosis present

## 2012-06-28 HISTORY — PX: ESOPHAGOGASTRODUODENOSCOPY: SHX5428

## 2012-06-28 LAB — CBC
HCT: 31.4 % — ABNORMAL LOW (ref 39.0–52.0)
Hemoglobin: 10.2 g/dL — ABNORMAL LOW (ref 13.0–17.0)
MCH: 26.4 pg (ref 26.0–34.0)
MCHC: 32.5 g/dL (ref 30.0–36.0)

## 2012-06-28 LAB — COMPREHENSIVE METABOLIC PANEL
ALT: 11 U/L (ref 0–53)
AST: 14 U/L (ref 0–37)
BUN: 21 mg/dL (ref 6–23)
Calcium: 11.4 mg/dL — ABNORMAL HIGH (ref 8.4–10.5)
Calcium: 10.4 mg/dL (ref 8.4–10.5)
GFR calc Af Amer: 63 mL/min — ABNORMAL LOW (ref >90)
GFR calc Af Amer: 63 mL/min — ABNORMAL LOW (ref >90)
Glucose, Bld: 156 mg/dL — ABNORMAL HIGH (ref 70–99)
Sodium: 141 mEq/L (ref 135–145)
Total Protein: 8.9 g/dL — ABNORMAL HIGH (ref 6.0–8.3)
Total Protein: 7.8 g/dL (ref 6.0–8.3)

## 2012-06-28 LAB — LIPASE, BLOOD: Lipase: 30 U/L (ref 11–59)

## 2012-06-28 LAB — SAMPLE TO BLOOD BANK

## 2012-06-28 LAB — CBC WITH DIFFERENTIAL/PLATELET
Eosinophils Absolute: 0 10*3/uL (ref 0.0–0.7)
Eosinophils Relative: 0 % (ref 0–5)
Hemoglobin: 12.2 g/dL — ABNORMAL LOW (ref 13.0–17.0)
Lymphs Abs: 1.1 10*3/uL (ref 0.7–4.0)
MCH: 27.1 pg (ref 26.0–34.0)
MCV: 82 fL (ref 78.0–100.0)
Monocytes Relative: 5 % (ref 3–12)
Platelets: 534 10*3/uL — ABNORMAL HIGH (ref 150–400)
RBC: 4.5 MIL/uL (ref 4.22–5.81)

## 2012-06-28 LAB — MAGNESIUM: Magnesium: 2.1 mg/dL (ref 1.5–2.5)

## 2012-06-28 LAB — TYPE AND SCREEN: Antibody Screen: NEGATIVE

## 2012-06-28 SURGERY — EGD (ESOPHAGOGASTRODUODENOSCOPY)
Anesthesia: Moderate Sedation

## 2012-06-28 MED ORDER — ARIPIPRAZOLE 1 MG/ML PO SOLN
1.5000 mg | Freq: Every day | ORAL | Status: DC
  Filled 2012-06-28 (×2): qty 1.5

## 2012-06-28 MED ORDER — SODIUM CHLORIDE 0.9 % IV SOLN
INTRAVENOUS | Status: AC
  Administered 2012-06-28: 125 mL/h via INTRAVENOUS

## 2012-06-28 MED ORDER — DULOXETINE HCL 60 MG PO CPEP
60.0000 mg | ORAL_CAPSULE | Freq: Every day | ORAL | Status: DC
  Administered 2012-06-28 – 2012-07-01 (×4): 60 mg via ORAL
  Filled 2012-06-28 (×4): qty 1

## 2012-06-28 MED ORDER — ONDANSETRON HCL 4 MG PO TABS: 4.0000 mg | ORAL_TABLET | Freq: Four times a day (QID) | ORAL | Status: DC | PRN

## 2012-06-28 MED ORDER — ARTIFICIAL TEARS OP OINT
0.2500 "application " | TOPICAL_OINTMENT | Freq: Every day | OPHTHALMIC | Status: DC
  Administered 2012-06-28 – 2012-06-30 (×3): 1 via OPHTHALMIC
  Filled 2012-06-28: qty 3.5

## 2012-06-28 MED ORDER — ONDANSETRON HCL 4 MG/2ML IJ SOLN: 4.0000 mg | Freq: Four times a day (QID) | INTRAMUSCULAR | Status: DC | PRN

## 2012-06-28 MED ORDER — ACETAMINOPHEN 650 MG RE SUPP: 650.0000 mg | Freq: Four times a day (QID) | RECTAL | Status: DC | PRN

## 2012-06-28 MED ORDER — LAMOTRIGINE 25 MG PO TABS
75.0000 mg | ORAL_TABLET | Freq: Every day | ORAL | Status: DC
  Administered 2012-06-28 – 2012-07-01 (×4): 75 mg via ORAL
  Filled 2012-06-28 (×4): qty 3

## 2012-06-28 MED ORDER — GUAIFENESIN-DM 100-10 MG/5ML PO SYRP
5.0000 mL | ORAL_SOLUTION | ORAL | Status: DC | PRN
  Filled 2012-06-28: qty 5

## 2012-06-28 MED ORDER — PANTOPRAZOLE SODIUM 40 MG PO TBEC
40.0000 mg | DELAYED_RELEASE_TABLET | Freq: Two times a day (BID) | ORAL | Status: DC
  Administered 2012-06-28 – 2012-07-01 (×7): 40 mg via ORAL
  Filled 2012-06-28 (×7): qty 1

## 2012-06-28 MED ORDER — SODIUM CHLORIDE 0.9 % IV SOLN
INTRAVENOUS | Status: DC
  Administered 2012-06-28 – 2012-06-29 (×3): via INTRAVENOUS
  Administered 2012-06-30: 1000 mL via INTRAVENOUS
  Administered 2012-06-30: 700 mL via INTRAVENOUS

## 2012-06-28 MED ORDER — SODIUM CHLORIDE 0.9 % IV BOLUS (SEPSIS)
250.0000 mL | Freq: Once | INTRAVENOUS | Status: AC
  Administered 2012-06-28: 250 mL via INTRAVENOUS

## 2012-06-28 MED ORDER — MIDAZOLAM HCL 10 MG/2ML IJ SOLN
INTRAMUSCULAR | Status: AC
  Filled 2012-06-28: qty 2

## 2012-06-28 MED ORDER — FENTANYL CITRATE 0.05 MG/ML IJ SOLN
INTRAMUSCULAR | Status: AC
Start: 2012-06-28 — End: 2012-06-28
  Filled 2012-06-28: qty 2

## 2012-06-28 MED ORDER — FENTANYL CITRATE 0.05 MG/ML IJ SOLN
INTRAMUSCULAR | Status: DC | PRN
  Administered 2012-06-28 (×2): 12.5 ug via INTRAVENOUS

## 2012-06-28 MED ORDER — SODIUM CHLORIDE 0.9 % IJ SOLN
3.0000 mL | Freq: Two times a day (BID) | INTRAMUSCULAR | Status: DC
  Administered 2012-06-28 – 2012-06-29 (×2): 3 mL via INTRAVENOUS

## 2012-06-28 MED ORDER — DIPHENHYDRAMINE HCL 50 MG/ML IJ SOLN
INTRAMUSCULAR | Status: AC
  Filled 2012-06-28: qty 1

## 2012-06-28 MED ORDER — MIDAZOLAM HCL 10 MG/2ML IJ SOLN
INTRAMUSCULAR | Status: DC | PRN
  Administered 2012-06-28 (×2): 1 mg via INTRAVENOUS

## 2012-06-28 MED ORDER — ALBUTEROL SULFATE (5 MG/ML) 0.5% IN NEBU: 2.5000 mg | INHALATION_SOLUTION | RESPIRATORY_TRACT | Status: DC | PRN

## 2012-06-28 MED ORDER — ACETAMINOPHEN 325 MG PO TABS: 650.0000 mg | ORAL_TABLET | Freq: Four times a day (QID) | ORAL | Status: DC | PRN

## 2012-06-28 MED ORDER — BUTAMBEN-TETRACAINE-BENZOCAINE 2-2-14 % EX AERO
INHALATION_SPRAY | CUTANEOUS | Status: DC | PRN
  Administered 2012-06-28: 2 via TOPICAL

## 2012-06-28 NOTE — Consult Note (Signed)
Reason for Consult: Upper GI bleeding Referring Physician: Hospital team  Kevin [name] Kevin [name] is an 78 y.o. male.  HPI: Patient seen at the request of the hospital team for upper GI bleeding he did have some abdominal pain in about 3 hours of nausea vomiting but is better now. He has chronic constipation and milk of magnesia helped and he is on an aspirin at home and has had a previous colonoscopy but unaware of a previous upper test. Currently he has no other complaint and the hospital computer was reviewed Past Medical History  Diagnosis Date  . UTI (lower urinary tract infection)   . Cardiomyopathy   . Insomnia   . Obesity   . AICD (automatic cardioverter/defibrillator) present   . Hypertension   . Vitamin d deficiency   . Sleep apnea     no cpap  . Dementia   . Depressive disorder     Past Surgical History  Procedure Date  . Total knee arthroplasty     left  . Back surgery   . Cardiac defibrillator placement     Family History  Problem Relation Age of Onset  . Stroke Mother   . Lung cancer Brother   . Brain cancer Brother     Social History:  reports that he has never smoked. He does not have any smokeless tobacco history on file. He reports that he does not drink alcohol or use illicit drugs.  Allergies:  Allergies  Allergen Reactions  . Morphine Other (See Comments)    Per MAR    Medications: I have reviewed the patient's current medications.  Results for orders placed during the hospital encounter of 06/27/12 (from the past 48 hour(s))  CBC WITH DIFFERENTIAL     Status: Abnormal   Collection Time   06/27/12 11:52 PM      Component Value Range Comment   WBC 9.8  4.0 - 10.5 K/uL    RBC 4.50  4.22 - 5.81 MIL/uL    Hemoglobin 12.2 (*) 13.0 - 17.0 g/dL    HCT 29.5 (*) 62.1 - 52.0 %    MCV 82.0  78.0 - 100.0 fL    MCH 27.1  26.0 - 34.0 pg    MCHC 33.1  30.0 - 36.0 g/dL    RDW 30.8  65.7 - 84.6 %    Platelets 534 (*) 150 - 400 K/uL    Neutrophils Relative 84 (*) 43  - 77 %    Neutro Abs 8.2 (*) 1.7 - 7.7 K/uL    Lymphocytes Relative 11 (*) 12 - 46 %    Lymphs Abs 1.1  0.7 - 4.0 K/uL    Monocytes Relative 5  3 - 12 %    Monocytes Absolute 0.4  0.1 - 1.0 K/uL    Eosinophils Relative 0  0 - 5 %    Eosinophils Absolute 0.0  0.0 - 0.7 K/uL    Basophils Relative 0  0 - 1 %    Basophils Absolute 0.0  0.0 - 0.1 K/uL   COMPREHENSIVE METABOLIC PANEL     Status: Abnormal   Collection Time   06/27/12 11:52 PM      Component Value Range Comment   Sodium 139  135 - 145 mEq/L    Potassium 4.1  3.5 - 5.1 mEq/L    Chloride 97  96 - 112 mEq/L    CO2 28  19 - 32 mEq/L    Glucose, Bld 156 (*) 70 - 99 mg/dL  BUN 21  6 - 23 mg/dL    Creatinine, Ser 9.60  0.50 - 1.35 mg/dL    Calcium 45.4 (*) 8.4 - 10.5 mg/dL    Total Protein 8.9 (*) 6.0 - 8.3 g/dL    Albumin 3.4 (*) 3.5 - 5.2 g/dL    AST 17  0 - 37 U/L    ALT 13  0 - 53 U/L    Alkaline Phosphatase 121 (*) 39 - 117 U/L    Total Bilirubin 0.2 (*) 0.3 - 1.2 mg/dL    GFR calc non Af Amer 55 (*) >90 mL/min    GFR calc Af Amer 63 (*) >90 mL/min   LIPASE, BLOOD     Status: Normal   Collection Time   06/27/12 11:52 PM      Component Value Range Comment   Lipase 30  11 - 59 U/L   SAMPLE TO BLOOD BANK     Status: Normal   Collection Time   06/28/12 12:53 AM      Component Value Range Comment   Blood Bank Specimen SAMPLE AVAILABLE FOR TESTING      Sample Expiration 06/29/2012     MRSA PCR SCREENING     Status: Normal   Collection Time   06/28/12  4:46 AM      Component Value Range Comment   MRSA by PCR NEGATIVE  NEGATIVE   COMPREHENSIVE METABOLIC PANEL     Status: Abnormal   Collection Time   06/28/12  6:44 AM      Component Value Range Comment   Sodium 141  135 - 145 mEq/L    Potassium 4.2  3.5 - 5.1 mEq/L    Chloride 102  96 - 112 mEq/L    CO2 29  19 - 32 mEq/L    Glucose, Bld 139 (*) 70 - 99 mg/dL    BUN 27 (*) 6 - 23 mg/dL    Creatinine, Ser 0.98  0.50 - 1.35 mg/dL    Calcium 11.9  8.4 - 10.5 mg/dL    Total  Protein 7.8  6.0 - 8.3 g/dL    Albumin 3.0 (*) 3.5 - 5.2 g/dL    AST 14  0 - 37 U/L    ALT 11  0 - 53 U/L    Alkaline Phosphatase 103  39 - 117 U/L    Total Bilirubin 0.1 (*) 0.3 - 1.2 mg/dL    GFR calc non Af Amer 55 (*) >90 mL/min    GFR calc Af Amer 63 (*) >90 mL/min   CBC     Status: Abnormal   Collection Time   06/28/12  6:44 AM      Component Value Range Comment   WBC 6.9  4.0 - 10.5 K/uL    RBC 3.86 (*) 4.22 - 5.81 MIL/uL    Hemoglobin 10.2 (*) 13.0 - 17.0 g/dL    HCT 14.7 (*) 82.9 - 52.0 %    MCV 81.3  78.0 - 100.0 fL    MCH 26.4  26.0 - 34.0 pg    MCHC 32.5  30.0 - 36.0 g/dL    RDW 56.2  13.0 - 86.5 %    Platelets 431 (*) 150 - 400 K/uL   TYPE AND SCREEN     Status: Normal   Collection Time   06/28/12  6:44 AM      Component Value Range Comment   ABO/RH(D) A NEG      Antibody Screen NEG  Sample Expiration 07/01/2012       No results found.  ROS negative except above Blood pressure 104/55, pulse 85, temperature 98.5 F (36.9 C), temperature source Oral, resp. rate 20, height 5\' 6"  (1.676 m), weight 66.7 kg (147 lb 0.8 oz), SpO2 95.00%. Physical Exam no acute distress vital signs stable able to answer questions lungs are clear regular rate and rhythm abdomen is soft nontender hemoglobin dropped from 12.2-10.2 with BUN increasing  Assessment/Plan: Upper GI bleed in a patient on aspirin and multiple medical problems Plan: The risks benefits methods of endoscopy was discussed with the patient and will proceed this morning with further workup and plans pending those findings  Kevin [name] E 06/28/2012, 11:42 AM

## 2012-06-28 NOTE — Op Note (Signed)
[facility] [address] [place], [place]  [address]  ENDOSCOPY PROCEDURE REPORT  PATIENT:  Kevin [name], Kevin [name]  MR#:  [id number] BIRTHDATE:  04/04/1925, 86 yrs. old  GENDER:  male  ENDOSCOPIST:  Vida Rigger, MD Referred by:  Soyla Murphy. Renne Crigler, M.D.  PROCEDURE DATE:  06/28/2012 PROCEDURE:  Esophagogastroscopy ASA CLASS:  Class II INDICATIONS:  upper GI bleed  MEDICATIONS:  25 mcg fentanyl 2 mg Versed TOPICAL ANESTHETIC: Used  DESCRIPTION OF PROCEDURE:   After the risks benefits and alternatives of the procedure were thoroughly explained, informed consent was obtained.  The Pentax Gastroscope [id number] endoscope was introduced through the mouth and advanced to the second portion of the duodenum, there was minimal old food remaining in the stomach but no blood was seen and other than a moderately large hiatal hernia there was no other endoscopic finding The instrument was slowly withdrawn as the mucosa was fully examined. The patient tolerated the procedure well there was no obvious immediate complication <<PROCEDUREIMAGES>>  FINDINGS: 1. Moderately large hiatal hernia 2. Minimal old food and debris in the stomach 3. Otherwise within normal limits EGD without any signs of bleeding or at risk lesions  COMPLICATIONS: None  ENDOSCOPIC IMPRESSION:  Above  RECOMMENDATIONS: Long term pump inhibitors antireflux measures slowly advance diet happy to see back when necessary  REPEAT EXAM:  As needed  ______________________________ Vida Rigger, MD  CC:  Romero Liner, MD  n. Rosalie DoctorVida Rigger at 06/28/2012 12:04 PM  Lister, Sharon Seller, [phone]

## 2012-06-28 NOTE — ED Notes (Signed)
protonix  Bolus has infused

## 2012-06-28 NOTE — Progress Notes (Signed)
Patient had a 5 beat run of Vtach. Notified Dr. Butler Denmark. Pt asymptomatic. Sleeping from sedation given in Endo Procedure. Arousable. Dr. Butler Denmark responded that she will see patient shortly.

## 2012-06-28 NOTE — ED Notes (Signed)
Nausea med given.  1000cc nss added to iv [employer]

## 2012-06-28 NOTE — Progress Notes (Signed)
[facility]   Pt evaluated prior to EGD this AM. No further hematemesis. No melena.  EGD reveals large hiatal hernia- no stigmata of bleeding. Cont PPI. Start clear liquids. Cont to follow in SDU today for re-bleed.  Hold Abilify today due to dehydration- can resume tomorrow. Cont to hydrate but decrease IVF to 75 cc/hr as pt will start eating/drinking.  Calvert Cantor, MD [phone]

## 2012-06-28 NOTE — Progress Notes (Signed)
Bladder scan reveal urine. Dr. Elwin Sleight notified. In and Out Cath ordered. In and Out straight cath completed via sterile technique by Earley Abide, RN. 450 ml urine output noted. Pt tolerated well.

## 2012-06-28 NOTE — H&P (Signed)
PCP:  Londell Moh, MD  Sandria ManlyDeboraha Sprang Dr. Alanda Amass  Chief Complaint:   Coffee ground emesis  HPI: Kevin [name] is a 78 y.o. male   has a past medical history of Dementia; UTI (lower urinary tract infection); Cardiomyopathy; Insomnia; Obesity; AICD (automatic cardioverter/defibrillator) present; Hypertension; Depressive disorder; Vitamin d deficiency; and Sleep apnea.   Presented with  Since 10 pm on 7/5 he had 2 episodes of coffee ground emesis, no abdominal pain. He never had this before. HE only takes baby aspirin at the SNF. He had colonoscopy done more than 5 years ago by Dr. Virginia Rochester (now retired) and was normal as per family. Denies any chest pain or shortness of breath, no melena no blood in stool. No fever or chills He has been treated for UTI recently now done with Antibiotics.  Patient has severe depression and dementia currently bed bound.  Review of Systems:    Pertinent positives include:nausea, vomiting, hematemesis  Constitutional:  No weight loss, night sweats, Fevers, chills, fatigue, weight loss  HEENT:  No headaches, Difficulty swallowing,Tooth/dental problems,Sore throat,  No sneezing, itching, ear ache, nasal congestion, post nasal drip,  Cardio-vascular:  No chest pain, Orthopnea, PND, anasarca, dizziness, palpitations.no Bilateral lower extremity swelling  GI:  No heartburn, indigestion, abdominal pain,  diarrhea, change in bowel habits, loss of appetite, melena, blood in stool,  Resp:  no shortness of breath at rest. No dyspnea on exertion, No excess mucus, no productive cough, No non-productive cough, No coughing up of blood.No change in color of mucus.No wheezing. Skin:  no rash or lesions. No jaundice GU:  no dysuria, change in color of urine, no urgency or frequency. No straining to urinate.  No flank pain.  Musculoskeletal:  No joint pain or no joint swelling. No decreased range of motion. No back pain.  Psych:  No change in mood or affect. No  depression or anxiety. No memory loss.  Neuro: no localizing neurological complaints, no tingling, no weakness, no double vision, no gait abnormality, no slurred speech, no confusion  Otherwise ROS are negative except for above, 10 systems were reviewed  Past Medical History: Past Medical History  Diagnosis Date  . Dementia   . UTI (lower urinary tract infection)   . Cardiomyopathy   . Insomnia   . Obesity   . AICD (automatic cardioverter/defibrillator) present   . Hypertension   . Depressive disorder   . Vitamin d deficiency   . Sleep apnea     no cpap   Past Surgical History  Procedure Date  . Total knee arthroplasty     left  . Back surgery   . Cardiac defibrillator placement      Medications: Prior to Admission medications   Medication Sig Start Date End Date Taking? Authorizing Provider  ARIPiprazole (ABILIFY) 2 MG tablet Take 1.5 mg by mouth daily.   Yes Historical Provider, MD  aspirin EC 81 MG tablet Take 81 mg by mouth daily.   Yes Historical Provider, MD  Calcium Carb-Cholecalciferol (CALCIUM-VITAMIN D) 500-400 MG-UNIT TABS Take 1 tablet by mouth daily.   Yes Historical Provider, MD  DULoxetine (CYMBALTA) 60 MG capsule Take 60 mg by mouth daily.   Yes Historical Provider, MD  lamoTRIgine (LAMICTAL) 25 MG tablet Take 75 mg by mouth daily.   Yes Historical Provider, MD  lisinopril (PRINIVIL,ZESTRIL) 10 MG tablet Take 10 mg by mouth daily.   Yes Historical Provider, MD  magnesium hydroxide (MILK OF MAGNESIA) 400 MG/5ML suspension Take 30 mLs by  mouth daily as needed. For constipation   Yes Historical Provider, MD  Propylene Glycol (SYSTANE BALANCE) 0.6 % SOLN Place 1 drop into both eyes 2 (two) times daily.   Yes Historical Provider, MD  traMADol (ULTRAM) 50 MG tablet Take 50 mg by mouth every 6 (six) hours as needed. For pain   Yes Historical Provider, MD  White Petrolatum-Mineral Oil (SYSTANE NIGHTTIME OP) Place 0.25 strips into both eyes at bedtime. Apply 1/4 "  strip to both eyes at bedtime   Yes Historical Provider, MD    Allergies:   Allergies  Allergen Reactions  . Morphine Other (See Comments)    Per MAR    Social History:  Ambulatory bed bound Lives at [facility]  [name] [phone] MPOA   reports that he has never smoked. He does not have any smokeless tobacco history on file. He reports that he does not drink alcohol or use illicit drugs.   Family History: family history includes Brain cancer in his brother; Lung cancer in his brother; and Stroke in his mother.    Physical Exam: Patient Vitals for the past 24 hrs:  BP Temp Temp src Pulse Resp SpO2  06/28/12 0123 113/68 mmHg 99 F (37.2 C) Oral - 22  94 %  06/28/12 0041 133/83 mmHg 98.8 F (37.1 C) Oral 88  25  96 %  06/27/12 2303 145/88 mmHg 99.3 F (37.4 C) Oral 100  22  97 %    1. General:  in No Acute distress 2. Psychological: Alert and Oriented to self 3. Head/ENT:   Moist  Mucous Membranes                          Head Non traumatic, neck supple                           Poor Dentition 4. SKIN:  decreased Skin turgor,  Skin clean Dry and intact no rash 5. Heart: Regular rate and rhythm no Murmur, Rub or gallop 6. Lungs: Clear to auscultation bilaterally, no wheezes or crackles   7. Abdomen: Soft, non-tender, Non distended 8. Lower extremities: no clubbing, cyanosis, or edema 9. Neurologically Grossly intact, moving all 4 extremities equally 10. MSK: Normal range of motion  body mass index is unknown because there is no height or weight on file.   Labs on Admission:   [facility] 06/27/12 2352  NA 139  K 4.1  CL 97  CO2 28  GLUCOSE 156*  BUN 21  CREATININE 1.18  CALCIUM 11.4*  MG --  PHOS --    Basename 06/27/12 2352  AST 17  ALT 13  ALKPHOS 121*  BILITOT 0.2*  PROT 8.9*  ALBUMIN 3.4*    Basename 06/27/12 2352  LIPASE 30  AMYLASE --    Basename 06/27/12 2352  WBC 9.8  NEUTROABS 8.2*  HGB 12.2*  HCT 36.9*  MCV 82.0  PLT  534*   No results found for this basename: CKTOTAL:3,CKMB:3,CKMBINDEX:3,TROPONINI:3 in the last 72 hours No results found for this basename: TSH,T4TOTAL,FREET3,T3FREE,THYROIDAB in the last 72 hours No results found for this basename: VITAMINB12:2,FOLATE:2,FERRITIN:2,TIBC:2,IRON:2,RETICCTPCT:2 in the last 72 hours No results found for this basename: HGBA1C    CrCl is unknown because there is no height on file for the current visit. ABG    Component Value Date/Time   HCO3 26.4* 03/16/2009 1508   TCO2 28 03/16/2009 1508   O2SAT  65.3 12/27/2010 2100     Lab Results  Component Value Date   DDIMER  Value: 8.02        AT THE INHOUSE ESTABLISHED CUTOFF VALUE OF 0.48 ug/mL FEU, THIS ASSAY HAS BEEN DOCUMENTED IN THE LITERATURE TO HAVE A SENSITIVITY AND NEGATIVE PREDICTIVE VALUE OF AT LEAST 98 TO 99%.  THE TEST RESULT SHOULD BE CORRELATED WITH AN ASSESSMENT OF THE CLINICAL PROBABILITY OF DVT / VTE.* 12/27/2010     Other results:  I have pearsonaly reviewed this: ECG REPORT  Rate: 92   Rhythm: paced  ST&T Change: N/A   Cultures:    Component Value Date/Time   SDES BLOOD RIGHT HAND 01/01/2011 1422   SPECREQUEST BOTTLES DRAWN AEROBIC ONLY 10CC 01/01/2011 1422   CULT NO GROWTH 5 DAYS 01/01/2011 1422   REPTSTATUS 01/07/2011 FINAL 01/01/2011 1422       Radiological Exams on Admission: No results found.  Assessment/Plan  78 year old gentleman with no prior history of GI bleed presents with coffee-ground emesis. His only risk factors of being on aspirin.  Present on Admission:  .Coffee ground emesis - patient is hemodynamically stable but given upper GI source will admit to step down. In the morning call GI family prefers [facility]. Will make sure patient is on Protonix IV drip, IV fluids, serial CBC type and screen transfuse as needed currently no indication for transfusion  .Hypertension - hold off on antihypertensive medication while acutely ill   Prophylaxis: SCD Protonix  CODE STATUS: FULL  CODE  Other plan as per orders.  I have spent a total of 55 min on this admission  Zola Runion 06/28/2012, 2:44 AM

## 2012-06-28 NOTE — ED Notes (Signed)
Admitting doctor here to see 

## 2012-06-28 NOTE — ED Provider Notes (Signed)
History     CSN: [id number]  Arrival date & time 06/27/12  2254   First MD Initiated Contact with Patient 06/27/12 2257      Chief Complaint  Patient presents with  . Emesis    (Consider location/radiation/quality/duration/timing/severity/associated sxs/prior treatment) HPI 78 yo male presents from nursing home via EMS with report of upper GI bleed.  Per report, pt with 3 episodes of coffee ground emesis.  No prior h/o same.  Pt with dementia, poor historian.  Daughter reports no prior h/o gi bleed, had colonoscopy about 5 years ago.  No reported dark or tarry stools.  Pt was feeling fine yesterday.  Normal breakfast/lunch today.  No NSAIDS, on asa but no other blood thinnners.  H/o cdiff 6 months ago.   Past Medical History  Diagnosis Date  . Dementia   . UTI (lower urinary tract infection)   . Cardiomyopathy   . Insomnia   . Obesity   . AICD (automatic cardioverter/defibrillator) present   . Hypertension   . Depressive disorder   . Vitamin d deficiency   . Sleep apnea     no cpap    Past Surgical History  Procedure Date  . Total knee arthroplasty     left  . Back surgery   . Cardiac defibrillator placement     Family History  Problem Relation Age of Onset  . Stroke Mother   . Lung cancer Brother   . Brain cancer Brother     History  Substance Use Topics  . Smoking status: Never Smoker   . Smokeless tobacco: Not on file  . Alcohol Use: No      Review of Systems  Unable to perform ROS: Dementia    Allergies  Morphine  Home Medications  No current outpatient prescriptions on file.  BP 126/67  Pulse 76  Temp 97.7 F (36.5 C) (Oral)  Resp 14  Ht 5\' 6"  (1.676 m)  Wt 147 lb 0.8 oz (66.7 kg)  BMI 23.73 kg/m2  SpO2 95%  Physical Exam  Nursing note and vitals reviewed. Constitutional: He appears well-developed and well-nourished. No distress.  HENT:  Head: Normocephalic and atraumatic.  Nose: Nose normal.  Mouth/Throat: Oropharynx is clear  and moist.       Dark emesis dried on chin  Eyes: Conjunctivae and EOM are normal. Pupils are equal, round, and reactive to light.  Neck: Normal range of motion. Neck supple. No JVD present. No tracheal deviation present. No thyromegaly present.  Cardiovascular: Normal rate, regular rhythm, normal heart sounds and intact distal pulses.  Exam reveals no gallop and no friction rub.   No murmur heard. Pulmonary/Chest: Effort normal and breath sounds normal. No stridor. No respiratory distress. He has no wheezes. He has no rales. He exhibits no tenderness.  Abdominal: Soft. Bowel sounds are normal. He exhibits no distension and no mass. There is no tenderness. There is no rebound and no guarding.  Musculoskeletal: Normal range of motion. He exhibits edema. He exhibits no tenderness.  Lymphadenopathy:    He has no cervical adenopathy.  Neurological: He exhibits normal muscle tone. Coordination normal.  Skin: Skin is dry. No rash noted. No erythema. There is pallor.  Psychiatric: He has a normal mood and affect. His behavior is normal. Judgment and thought content normal.    ED Course  Procedures (including critical care time)  Labs Reviewed  CBC WITH DIFFERENTIAL - Abnormal; Notable for the following:    Hemoglobin 12.2 (*)  HCT 36.9 (*)     Platelets 534 (*)     Neutrophils Relative 84 (*)     Neutro Abs 8.2 (*)     Lymphocytes Relative 11 (*)     All other components within normal limits  COMPREHENSIVE METABOLIC PANEL - Abnormal; Notable for the following:    Glucose, Bld 156 (*)     Calcium 11.4 (*)     Total Protein 8.9 (*)     Albumin 3.4 (*)     Alkaline Phosphatase 121 (*)     Total Bilirubin 0.2 (*)     GFR calc non Af Amer 55 (*)     GFR calc Af Amer 63 (*)     All other components within normal limits  COMPREHENSIVE METABOLIC PANEL - Abnormal; Notable for the following:    Glucose, Bld 139 (*)     BUN 27 (*)     Albumin 3.0 (*)     Total Bilirubin 0.1 (*)     GFR  calc non Af Amer 55 (*)     GFR calc Af Amer 63 (*)     All other components within normal limits  CBC - Abnormal; Notable for the following:    RBC 3.86 (*)     Hemoglobin 10.2 (*)     HCT 31.4 (*)     Platelets 431 (*)     All other components within normal limits  LIPASE, BLOOD  SAMPLE TO BLOOD BANK  MRSA PCR SCREENING  TYPE AND SCREEN  ABO/RH  OCCULT BLOOD GASTRIC / DUODENUM  MAGNESIUM  PHOSPHORUS  CBC  CBC  CBC   No results found.   Date: 06/28/2012  Rate: 92  Rhythm: normal sinus rhythm  QRS Axis: left  Intervals: PR prolonged  ST/T Wave abnormalities: nonspecific ST changes  Conduction Disutrbances:nonspecific intraventricular conduction delay  Narrative Interpretation:   Old EKG Reviewed: unchanged    1. Coffee ground emesis   2. Hypertension       MDM  78 yo male with new onset gi bleed.  Vitals stable, normal h/h at this time.  To admit.        Olivia Mackie, MD 06/30/12 2110

## 2012-06-28 NOTE — ED Notes (Signed)
The pts family   [name] and Jonny Ruiz can be reached at [phone] if needed any time.

## 2012-06-28 NOTE — Progress Notes (Signed)
Spoke with [employer]. Pt has a [employer] pacemaker only. ( NO ICD) model# [id number] serial#[id number], implanted date 10/11/2010. Spoke with Vania Rea at [employer] Macy Mis RN

## 2012-06-28 NOTE — Progress Notes (Signed)
DR. Butler Denmark NOTIFIED THAT PATIENT HAS NOT VOIDED SINCE 8AM AND STATES HE FEELS LIKE HE NEEDS TO BUT UNABLE TO AT THIS TIME, BLADDER SCAN DONE AND 534CC OF URINE IN BLADDER,DR. RIZWAN ORDERED TO DO IN AND OUT CATH. Shoshana Johal RN

## 2012-06-28 NOTE — ED Notes (Signed)
protonix bolus hung  80 mg iv

## 2012-06-28 NOTE — ED Notes (Signed)
The pt

## 2012-06-29 DIAGNOSIS — R338 Other retention of urine: Secondary | ICD-10-CM | POA: Diagnosis not present

## 2012-06-29 DIAGNOSIS — D649 Anemia, unspecified: Secondary | ICD-10-CM | POA: Diagnosis not present

## 2012-06-29 LAB — BASIC METABOLIC PANEL
BUN: 28 mg/dL — ABNORMAL HIGH (ref 6–23)
CO2: 29 mEq/L (ref 19–32)
GFR calc non Af Amer: 54 mL/min — ABNORMAL LOW (ref >90)
Glucose, Bld: 90 mg/dL (ref 70–99)
Potassium: 4 mEq/L (ref 3.5–5.1)
Sodium: 142 mEq/L (ref 135–145)

## 2012-06-29 LAB — CBC WITH DIFFERENTIAL/PLATELET
Basophils Relative: 0 % (ref 0–1)
Eosinophils Absolute: 0.3 10*3/uL (ref 0.0–0.7)
Eosinophils Relative: 4 % (ref 0–5)
HCT: 26.7 % — ABNORMAL LOW (ref 39.0–52.0)
Hemoglobin: 8.6 g/dL — ABNORMAL LOW (ref 13.0–17.0)
Lymphs Abs: 1.6 10*3/uL (ref 0.7–4.0)
MCH: 26.9 pg (ref 26.0–34.0)
MCHC: 32.2 g/dL (ref 30.0–36.0)
MCV: 83.4 fL (ref 78.0–100.0)
Monocytes Absolute: 0.6 10*3/uL (ref 0.1–1.0)
Monocytes Relative: 7 % (ref 3–12)
Neutrophils Relative %: 70 % (ref 43–77)
RBC: 3.2 MIL/uL — ABNORMAL LOW (ref 4.22–5.81)

## 2012-06-29 MED ORDER — TAMSULOSIN HCL 0.4 MG PO CAPS
0.4000 mg | ORAL_CAPSULE | Freq: Every day | ORAL | Status: DC
  Administered 2012-06-29 – 2012-06-30 (×2): 0.4 mg via ORAL
  Filled 2012-06-29 (×3): qty 1

## 2012-06-29 MED ORDER — POLYETHYLENE GLYCOL 3350 17 G PO PACK
17.0000 g | PACK | ORAL | Status: AC
  Administered 2012-06-29: 17 g via ORAL
  Filled 2012-06-29: qty 1

## 2012-06-29 MED ORDER — BISACODYL 10 MG RE SUPP
10.0000 mg | Freq: Once | RECTAL | Status: AC
  Administered 2012-06-29: 10 mg via RECTAL
  Filled 2012-06-29: qty 1

## 2012-06-29 NOTE — Progress Notes (Signed)
Bladder scan reads >391

## 2012-06-29 NOTE — Progress Notes (Addendum)
Completed in and out catheterization; clear, yellow urine out and 1 small blood clot; patient tolerated well

## 2012-06-29 NOTE — Plan of Care (Signed)
Problem: Phase I Progression Outcomes Goal: Voiding-avoid urinary catheter unless indicated Outcome: Not Progressing Pt has had to be in and out cathed since yesterday, unable to void on own

## 2012-06-29 NOTE — Progress Notes (Signed)
[facility]  PCP: Londell Moh, MD  Interim history: 78 y/o who presented with 2 episodes of coffee ground emesis. ASA was held, he underwent and EGD on 7/6 which did not reveal any stigmata of bleeding or ulcers. He has not bled again since being admitted.   Consultants: Dr Ewing Schlein  Subjective: Has not been able to urinate and required I and O cath twice since yesterday. Despite feeling like he needs to void, he is still unable to this AM. This has never happened in the past.   Objective: Blood pressure 109/61, pulse 86, temperature 96.9 F (36.1 C), temperature source Oral, resp. rate 24, height 5\' 6"  (1.676 m), weight 70 kg (154 lb 5.2 oz), SpO2 94.00%. Weight change: 3.3 kg (7 lb 4.4 oz)  Intake/Output Summary (Last 24 hours) at 06/29/12 1733 Last data filed at 06/29/12 1600  Gross per 24 hour  Intake   2610 ml  Output    825 ml  Net   1785 ml    Physical Exam: General appearance: alert, cooperative and no distress Throat: lips, mucosa, and tongue normal; teeth and gums normal Lungs: clear to auscultation bilaterally Heart: regular rate and rhythm, S1, S2 normal, no murmur Abdomen: soft, non-tender; bowel sounds normal; no masses,  no organomegaly Extremities: extremities normal, atraumatic, no cyanosis or edema  Lab Results:  Basename 06/29/12 0350 06/28/12 2259 06/28/12 0644  NA 142 -- 141  K 4.0 -- 4.2  CL 107 -- 102  CO2 29 -- 29  GLUCOSE 90 -- 139*  BUN 28* -- 27*  CREATININE 1.20 -- 1.18  CALCIUM 9.7 -- 10.4  MG -- 2.1 --  PHOS -- -- --    Basename 06/28/12 0644 06/27/12 2352  AST 14 17  ALT 11 13  ALKPHOS 103 121*  BILITOT 0.1* 0.2*  PROT 7.8 8.9*  ALBUMIN 3.0* 3.4*    Basename 06/27/12 2352  LIPASE 30  AMYLASE --    Basename 06/29/12 0350 06/28/12 0644 06/27/12 2352  WBC 8.4 6.9 --  NEUTROABS 5.9 -- 8.2*  HGB 8.6* 10.2* --  HCT 26.7* 31.4* --  MCV 83.4 81.3 --  PLT 383 431* --   No results found for this basename:  CKTOTAL:3,CKMB:3,CKMBINDEX:3,TROPONINI:3 in the last 72 hours No components found with this basename: POCBNP:3 No results found for this basename: DDIMER:2 in the last 72 hours No results found for this basename: HGBA1C:2 in the last 72 hours No results found for this basename: CHOL:2,HDL:2,LDLCALC:2,TRIG:2,CHOLHDL:2,LDLDIRECT:2 in the last 72 hours No results found for this basename: TSH,T4TOTAL,FREET3,T3FREE,THYROIDAB in the last 72 hours No results found for this basename: VITAMINB12:2,FOLATE:2,FERRITIN:2,TIBC:2,IRON:2,RETICCTPCT:2 in the last 72 hours  Micro Results: Recent Results (from the past 240 hour(s))  MRSA PCR SCREENING     Status: Normal   Collection Time   06/28/12  4:46 AM      Component Value Range Status Comment   MRSA by PCR NEGATIVE  NEGATIVE Final     Studies/Results: No results found.  Medications: Scheduled Meds:   . artificial tears  1 application Both Eyes QHS  . bisacodyl  10 mg Rectal Once  . DULoxetine  60 mg Oral Daily  . lamoTRIgine  75 mg Oral Daily  . pantoprazole  40 mg Oral BID AC  . polyethylene glycol  17 g Oral NOW  . sodium chloride  3 mL Intravenous Q12H   Continuous Infusions:   . sodium chloride 75 mL/hr at 06/29/12 0624   PRN Meds:.acetaminophen, acetaminophen, albuterol, guaiFENesin-dextromethorphan, ondansetron (ZOFRAN)  IV, ondansetron  Assessment/Plan:   Coffee ground emesis EGD reveals hiatal hernia and no bleeding. However, his Hgb has dropped to 8 today from a baseline of 10. Gi recommends waiting for him to have a BM and ensuring it is not bloody prior to advancing from clear liquids. Miralax has not helped - I have added a dulcolax suppository.  Cont to hold ASA Cont IVF  BID Protonix recommended   Urinary retention Will order a foley and Flomax.    Hypertension BP low, holding meds  Code Status: Full code   [facility] [phone] 06/29/2012, 5:33 PM  LOS: 2 days

## 2012-06-29 NOTE — Progress Notes (Signed)
Patient without urine output documented since 1700 on 06/28/12 which was obtained by in and out catheterization.  Patient denies urge, denies pain on abdominal palpation.  Small amount of blood at tip of penis in condom catheter.  No known injury occurred per patient. MD made aware.  Will obtain bladder scan

## 2012-06-29 NOTE — Progress Notes (Signed)
Kevin [name] 11:01 AM  Subjective: Patient had no problems with his endoscopy and has no signs of GI bleeding and no longer having abdominal pain nausea or vomiting and is tolerating clear liquids but has not had a bowel movement. He is having problems urologically and a little blood in his urine  Objective: Vital signs stable afebrile abdomen is soft nontender hemoglobin has dropped a little more and BUN up at touch  Assessment: Seemingly resolve GI bleeding  Plan: We will use MiraLAX which that and milk of magnesia helped him at home and if no signs of GI bleeding after bowel movements can probably slowly advance diet but continue pump inhibitors and consider urologic consult  [facility] E

## 2012-06-30 LAB — BASIC METABOLIC PANEL
Calcium: 9 mg/dL (ref 8.4–10.5)
GFR calc Af Amer: 85 mL/min — ABNORMAL LOW (ref >90)
GFR calc non Af Amer: 74 mL/min — ABNORMAL LOW (ref >90)
Glucose, Bld: 82 mg/dL (ref 70–99)
Potassium: 3.6 mEq/L (ref 3.5–5.1)
Sodium: 136 mEq/L (ref 135–145)

## 2012-06-30 LAB — CBC WITH DIFFERENTIAL/PLATELET
Basophils Relative: 0 % (ref 0–1)
Eosinophils Absolute: 0.4 10*3/uL (ref 0.0–0.7)
Eosinophils Relative: 7 % — ABNORMAL HIGH (ref 0–5)
Lymphs Abs: 1.6 10*3/uL (ref 0.7–4.0)
MCH: 26.4 pg (ref 26.0–34.0)
MCHC: 32.3 g/dL (ref 30.0–36.0)
MCV: 81.7 fL (ref 78.0–100.0)
Neutrophils Relative %: 60 % (ref 43–77)
Platelets: 322 10*3/uL (ref 150–400)
RBC: 2.84 MIL/uL — ABNORMAL LOW (ref 4.22–5.81)
RDW: 14.4 % (ref 11.5–15.5)

## 2012-06-30 MED ORDER — BETHANECHOL CHLORIDE 10 MG PO TABS
10.0000 mg | ORAL_TABLET | Freq: Three times a day (TID) | ORAL | Status: DC
  Administered 2012-07-01 (×3): 10 mg via ORAL
  Filled 2012-06-30 (×4): qty 1

## 2012-06-30 NOTE — Progress Notes (Signed)
[facility] [place] TEAM 1 - Stepdown/ICU TEAM  PCP: Londell Moh, MD  Interim history: 78 y/o who presented with 2 episodes of coffee ground emesis. ASA was held, he underwent an EGD on 7/6 which did not reveal any stigmata of bleeding or ulcers. He has not bled again since being admitted.   He has not been able to urinate and has required I and O cath twice since his admit. Despite feeling like he needs to void, he is still unable to this AM. This has never happened in the past.   Consultants: Dr Ewing Schlein  Subjective: The pt reports that he had a large BM last night.  He states that it was "normal" with no blood and not melanotic in appearance. He denies f/c, sob, or n/v.      Objective: Blood pressure 115/65, pulse 70, temperature 98.6 F (37 C), temperature source Oral, resp. rate 21, height 5\' 6"  (1.676 m), weight 71 kg (156 lb 8.4 oz), SpO2 95.00%. Weight change: 1 kg (2 lb 3.3 oz)  Intake/Output Summary (Last 24 hours) at 06/30/12 1350 Last data filed at 06/30/12 0900  Gross per 24 hour  Intake   2920 ml  Output    950 ml  Net   1970 ml    Physical Exam: General appearance: alert, cooperative, no acute resp distress Lungs: clear to auscultation bilaterally w/o wheeze or focal crackles Heart: regular rate and rhythm, S1, S2 normal, no murmur Abdomen: soft, non-tender; bowel sounds normal; no masses,  no organomegaly Extremities: extremities normal, atraumatic, no cyanosis or edema  Lab Results:  Basename 06/30/12 0505 06/29/12 0350 06/28/12 2259  NA 136 142 --  K 3.6 4.0 --  CL 106 107 --  CO2 21 29 --  GLUCOSE 82 90 --  BUN 18 28* --  CREATININE 0.97 1.20 --  CALCIUM 9.0 9.7 --  MG -- -- 2.1  PHOS -- -- --    Basename 06/28/12 0644 06/27/12 2352  AST 14 17  ALT 11 13  ALKPHOS 103 121*  BILITOT 0.1* 0.2*  PROT 7.8 8.9*  ALBUMIN 3.0* 3.4*    Basename 06/27/12 2352  LIPASE 30  AMYLASE --    Basename 06/30/12 0505 06/29/12 0350  WBC  5.9 8.4  NEUTROABS 3.6 5.9  HGB 7.5* 8.6*  HCT 23.2* 26.7*  MCV 81.7 83.4  PLT 322 383   Micro Results: Recent Results (from the past 240 hour(s))  MRSA PCR SCREENING     Status: Normal   Collection Time   06/28/12  4:46 AM      Component Value Range Status Comment   MRSA by PCR NEGATIVE  NEGATIVE Final    Assessment/Plan:  Coffee ground emesis EGD reveals hiatal hernia and no bleeding - Hgb continues to drop, now at a new nadir of 7.5 (baseline of 10) - BID Protonix - will need colo if Hgb continues to drop - suspect drop may be primarily due to dilution w/ hydration  Acute blood loss anemia As noted above - will require transfusion if Hgb drops below 7.0  Urinary retention Foley and Flomax - begin voiding trials in the AM  Hypertension BP improving w/ volume resuscitation   Hx of non-ishcemic cardiomyopathy w/ AICD (chronic LBBB) Well compensated at present   Dementia At baseline  Code Status: Full code   06/30/2012, 1:50 PM  LOS: 3 days   Lonia Blood, MD [facility] Office  [phone] Pager [phone]  On-Call/Text Page:  [email or web address]      password [employer]

## 2012-06-30 NOTE — Clinical Social Work Psychosocial (Signed)
     Clinical Social Work Department BRIEF PSYCHOSOCIAL ASSESSMENT 06/30/2012  Patient:  Kevin [name], Kevin [name]     Account Number:  [id number]     Admit date:  06/27/2012  Clinical Social Worker:  Margaree Mackintosh  Date/Time:  06/30/2012 11:24 AM  Referred by:  Physician  Date Referred:  06/30/2012 Referred for  SNF Placement   Other Referral:   Interview type:  Patient Other interview type:   & Dtr via telephone.    PSYCHOSOCIAL DATA Living Status:  FACILITY Admitted from facility:  [facility], [place] Level of care:  Skilled Nursing Facility Primary support name:  [phone] Primary support relationship to patient:  CHILD, ADULT Degree of support available:   Adequate.    CURRENT CONCERNS Current Concerns  Post-Acute Placement   Other Concerns:    SOCIAL WORK ASSESSMENT / PLAN Clinical Social Worker recieved referral for SNF placement at time of dc.  CSW reviewed chart and attempted to meet with pt, pt currently on telephone in his room.  CSW phoned pt's dtr, [name].  CSW introduced self, explained role, and provided support.  Dtr confirmed plan to dc back to [facility] at time of dc.  CSW reviewed SNF process and addressed questions/concerns.  CSW to continue to follow and assist as needed.   Assessment/plan status:  Information/Referral to [facility] Other assessment/ plan:   Information/referral to community resources:   SNF.    PATIENTS/FAMILYS RESPONSE TO PLAN OF CARE: Pt currently distracted by telephone call, but appeared pleasant.  Dtr was pleasant and appropriately engaged.  Dtr thanked CSW for intervention.

## 2012-06-30 NOTE — Progress Notes (Signed)
Assessed right forearm PIV that was started today. Site with small bruise noted and moisture noted under dressing. Extension loose, tightened at cath. and extension connection. Small blood return aspirated. Flushed well with NS 10ml. New tubing hung by Eber Jones, RN and IVF resumed at 39ml/hr.

## 2012-06-30 NOTE — Progress Notes (Signed)
Utilization Review Completed.  Kevin [name] T  06/30/2012  

## 2012-06-30 NOTE — Progress Notes (Signed)
Patient ID: Kevin [name], male   DOB: 16-Sep-1925, 78 y.o.   MRN: [id number] [facility] Gastroenterology Progress Note  Subjective: The patient is voicing no complaints. No stools documented since yesterday  Objective: Vital signs in last 24 hours: Temp:  [96.9 F (36.1 C)-98.9 F (37.2 C)] 98.6 F (37 C) (07/08 0845) Pulse Rate:  [70-95] 70  (07/08 0845) Resp:  [13-24] 21  (07/08 0845) BP: (103-112)/(60-64) 109/63 mmHg (07/08 0845) SpO2:  [93 %-96 %] 95 % (07/08 0845) Weight:  [71 kg (156 lb 8.4 oz)] 71 kg (156 lb 8.4 oz) (07/08 0446) Weight change: 1 kg (2 lb 3.3 oz)   PE: Unchanged  Lab Results: Results for orders placed during the hospital encounter of 06/27/12 (from the past 24 hour(s))  CBC WITH DIFFERENTIAL     Status: Abnormal   Collection Time   06/30/12  5:05 AM      Component Value Range   WBC 5.9  4.0 - 10.5 K/uL   RBC 2.84 (*) 4.22 - 5.81 MIL/uL   Hemoglobin 7.5 (*) 13.0 - 17.0 g/dL   HCT 40.9 (*) 81.1 - 91.4 %   MCV 81.7  78.0 - 100.0 fL   MCH 26.4  26.0 - 34.0 pg   MCHC 32.3  30.0 - 36.0 g/dL   RDW 78.2  95.6 - 21.3 %   Platelets 322  150 - 400 K/uL   Neutrophils Relative 60  43 - 77 %   Neutro Abs 3.6  1.7 - 7.7 K/uL   Lymphocytes Relative 27  12 - 46 %   Lymphs Abs 1.6  0.7 - 4.0 K/uL   Monocytes Relative 6  3 - 12 %   Monocytes Absolute 0.4  0.1 - 1.0 K/uL   Eosinophils Relative 7 (*) 0 - 5 %   Eosinophils Absolute 0.4  0.0 - 0.7 K/uL   Basophils Relative 0  0 - 1 %   Basophils Absolute 0.0  0.0 - 0.1 K/uL  BASIC METABOLIC PANEL     Status: Abnormal   Collection Time   06/30/12  5:05 AM      Component Value Range   Sodium 136  135 - 145 mEq/L   Potassium 3.6  3.5 - 5.1 mEq/L   Chloride 106  96 - 112 mEq/L   CO2 21  19 - 32 mEq/L   Glucose, Bld 82  70 - 99 mg/dL   BUN 18  6 - 23 mg/dL   Creatinine, Ser 0.86  0.50 - 1.35 mg/dL   Calcium 9.0  8.4 - 57.8 mg/dL   GFR calc non Af Amer 74 (*) >90 mL/min   GFR calc Af Amer 85 (*) >90 mL/min     Studies/Results: No results found.    Assessment: GI bleeding with equivocal findings on EGD. Also with hematuria and further recent drop in hemoglobin  Plan: Continue to monitor stools and hemoglobin. Consider colonoscopy if continues to bleed, apparently has had one in the past but the date and location are not clear at this point.    Valeriano Bain C 06/30/2012, 9:47 AM

## 2012-07-01 ENCOUNTER — Encounter (HOSPITAL_COMMUNITY): Payer: Self-pay | Admitting: Gastroenterology

## 2012-07-01 LAB — CBC
Hemoglobin: 8 g/dL — ABNORMAL LOW (ref 13.0–17.0)
MCH: 26.9 pg (ref 26.0–34.0)
MCHC: 33.3 g/dL (ref 30.0–36.0)
Platelets: 330 10*3/uL (ref 150–400)
RBC: 2.97 MIL/uL — ABNORMAL LOW (ref 4.22–5.81)

## 2012-07-01 LAB — BASIC METABOLIC PANEL
CO2: 24 mEq/L (ref 19–32)
Calcium: 9.2 mg/dL (ref 8.4–10.5)
GFR calc non Af Amer: 74 mL/min — ABNORMAL LOW (ref >90)
Potassium: 3.1 mEq/L — ABNORMAL LOW (ref 3.5–5.1)
Sodium: 137 mEq/L (ref 135–145)

## 2012-07-01 MED ORDER — TAMSULOSIN HCL 0.4 MG PO CAPS: 0.4000 mg | ORAL_CAPSULE | Freq: Every day | ORAL | Status: AC

## 2012-07-01 MED ORDER — PANTOPRAZOLE SODIUM 40 MG PO TBEC: 40.0000 mg | DELAYED_RELEASE_TABLET | Freq: Two times a day (BID) | ORAL | Status: DC

## 2012-07-01 NOTE — Progress Notes (Signed)
Clinical Social Worker received notification from [facility] that pt is to be dc'd today back to [facility].  Prior to dc, FL2 will need to be signed, MD aware via text page.  CSW to continue to follow and assist as needed.   Angelia Mould, MSW, [place] [phone]

## 2012-07-01 NOTE — Plan of Care (Signed)
Problem: Phase I Progression Outcomes Goal: Voiding-avoid urinary catheter unless indicated Outcome: Completed/Met Date Met:  07/01/12 Incontinent of B & B

## 2012-07-01 NOTE — Progress Notes (Signed)
Clinical Social Worker reviewed case with RN-Tata.  RN to phone SNF and provide report.  RN requested to phone transport.  CSW prepared DC packet and provided to RN.  CSW to sign off, please re consult if needed.   Angelia Mould, MSW, [place] [phone]

## 2012-07-01 NOTE — Progress Notes (Signed)
Clinical Social Worker submitted DC summary to [facility].  CSW spoke with RN and inquired if pt's dtr has been made aware.  CSW phoned pt's dtr and reviewed plan, dtr agreeable. CSW to phone transport once pt ready/cleared.  CSW to continue to follow and assist as needed.   Angelia Mould, MSW, [place] [phone]

## 2012-07-01 NOTE — Discharge Summary (Addendum)
DISCHARGE SUMMARY  Kevin [name]  Kevin#: [id number]  DOB:August 04, 1925  Date of Admission: 06/27/2012 Date of Discharge: 07/01/2012  Attending Physician:Quaniya Damas  Patient's MVH:QIONG,EXBMWU DAVIDSON, MD  Consults:Treatment Team:  Petra Kuba, MD Barrie Folk, MD  Presenting Complaint: Coffee ground vomitus  Discharge Diagnoses: Active Problems:  Coffee ground emesis  Hypertension  Acute urinary retention  Anemia- due to acute blood loss and dilutional    Discharge Medications: Medication List  As of 07/01/2012 10:11 AM   STOP taking these medications         aspirin EC 81 MG tablet      lisinopril 10 MG tablet         TAKE these medications         Aripiprazole 1 MG/ML mL   Take 1.5 mg by mouth daily.      Calcium-Vitamin D 500-400 MG-UNIT Tabs   Take 1 tablet by mouth daily.      DULoxetine 60 MG capsule   Commonly known as: CYMBALTA   Take 60 mg by mouth daily.      lamoTRIgine 25 MG tablet   Commonly known as: LAMICTAL   Take 75 mg by mouth daily.      magnesium hydroxide 400 MG/5ML suspension   Commonly known as: MILK OF MAGNESIA   Take 30 mLs by mouth daily as needed. For constipation      pantoprazole 40 MG tablet   Commonly known as: PROTONIX   Take 1 tablet (40 mg total) by mouth 2 (two) times daily before a meal.      SYSTANE BALANCE 0.6 % Soln   Generic drug: Propylene Glycol   Place 1 drop into both eyes 2 (two) times daily.      SYSTANE NIGHTTIME OP   Place 0.25 strips into both eyes at bedtime. Apply 1/4 " strip to both eyes at bedtime      Tamsulosin HCl 0.4 MG Caps   Commonly known as: FLOMAX   Take 1 capsule (0.4 mg total) by mouth daily after supper.      traMADol 50 MG tablet   Commonly known as: ULTRAM   Take 50 mg by mouth every 6 (six) hours as needed. For pain             Procedures: EGD-  06/28/12- Dr Lavada Mesi. Magod 1. Moderately large hiatal hernia 2. Minimal old food  and debris in the stomach 3. Otherwise within  normal limits EGD  without any signs of bleeding or at risk lesions    Hospital Course: Active Problems:  Coffee ground emesis Kevin [name] presented with 2 episodes of coffee ground emesis at [facility] facility. He has not had any abdominal pain, reflux or blood stools recently or weight loss. He underwent an EGD on the day of admission which revealed above results. Dr Ewing Schlein has recommended use of PPI longterm. He is currently been placed on BID Protonix by GI. He has had numerous non-bloody BMs since the EGD. No further hematemesis. Tolerating a regular diet.    Anemia His baseline hemoglobin appears to be near 10-11-  last blood work we have is from  He presented with a Hgb of 12 which has dropped to 8. We suspect it is partly from acute blood loss and partly from hydration. Will need to repeat in 1 wk. We also recommend checking an iron panel. We have not given the patient any blood transfusions.    Acute urinary retention After the EGD  pt was noted to be unable to void. I and O cath was done 2 x over the course of the day and subsequently a Foley was placed and he was started on Flomax. Voiding trial was given the following day and he is now voiding.    Hypertension BP has been low-normal. We have been holding Lisinoprol. Can be resumed as BP allows at facility.   Day of Discharge Physical Exam: BP 113/92  Pulse 84  Temp 98.2 F (36.8 C) (Oral)  Resp 12  Ht 5\' 6"  (1.676 m)  Wt 72.3 kg (159 lb 6.3 oz)  BMI 25.73 kg/m2  SpO2 100% General appearance: alert, cooperative and no distress  Throat: lips, mucosa, and tongue normal; teeth and gums normal  Lungs: clear to auscultation bilaterally  Heart: regular rate and rhythm, S1, S2 normal, no murmur  Abdomen: soft, non-tender; bowel sounds normal; no masses, no organomegaly  Extremities: extremities normal, atraumatic, no cyanosis or edema    Results for orders placed during the hospital encounter of 06/27/12 (from the past  24 hour(s))  CBC     Status: Abnormal   Collection Time   07/01/12  4:10 AM      Component Value Range   WBC 4.7  4.0 - 10.5 K/uL   RBC 2.97 (*) 4.22 - 5.81 MIL/uL   Hemoglobin 8.0 (*) 13.0 - 17.0 g/dL   HCT 16.1 (*) 09.6 - 04.5 %   MCV 80.8  78.0 - 100.0 fL   MCH 26.9  26.0 - 34.0 pg   MCHC 33.3  30.0 - 36.0 g/dL   RDW 40.9  81.1 - 91.4 %   Platelets 330  150 - 400 K/uL  BASIC METABOLIC PANEL     Status: Abnormal   Collection Time   07/01/12  4:10 AM      Component Value Range   Sodium 137  135 - 145 mEq/L   Potassium 3.1 (*) 3.5 - 5.1 mEq/L   Chloride 105  96 - 112 mEq/L   CO2 24  19 - 32 mEq/L   Glucose, Bld 94  70 - 99 mg/dL   BUN 11  6 - 23 mg/dL   Creatinine, Ser 7.82  0.50 - 1.35 mg/dL   Calcium 9.2  8.4 - 95.6 mg/dL   GFR calc non Af Amer 74 (*) >90 mL/min   GFR calc Af Amer 86 (*) >90 mL/min    Disposition: stable for d/c to [facility]   Follow-up Appts: f/u with PCP - no need to return to see GI unless further problems arise.   Time on Discharge: >45 min  Signed: Deshae Dickison 07/01/2012, 10:11 AM

## 2013-03-26 ENCOUNTER — Non-Acute Institutional Stay (SKILLED_NURSING_FACILITY): Payer: Medicare Other | Admitting: Internal Medicine

## 2013-03-26 DIAGNOSIS — F329 Major depressive disorder, single episode, unspecified: Secondary | ICD-10-CM

## 2013-03-26 DIAGNOSIS — N400 Enlarged prostate without lower urinary tract symptoms: Secondary | ICD-10-CM

## 2013-03-26 DIAGNOSIS — F0280 Dementia in other diseases classified elsewhere without behavioral disturbance: Secondary | ICD-10-CM

## 2013-03-26 DIAGNOSIS — K5900 Constipation, unspecified: Secondary | ICD-10-CM

## 2013-03-26 DIAGNOSIS — G309 Alzheimer's disease, unspecified: Secondary | ICD-10-CM

## 2013-03-26 DIAGNOSIS — F3289 Other specified depressive episodes: Secondary | ICD-10-CM

## 2013-03-26 DIAGNOSIS — K219 Gastro-esophageal reflux disease without esophagitis: Secondary | ICD-10-CM

## 2013-03-31 ENCOUNTER — Encounter: Payer: Self-pay | Admitting: Internal Medicine

## 2013-03-31 ENCOUNTER — Non-Acute Institutional Stay (SKILLED_NURSING_FACILITY): Payer: Medicare Other | Admitting: Internal Medicine

## 2013-03-31 DIAGNOSIS — D649 Anemia, unspecified: Secondary | ICD-10-CM

## 2013-03-31 DIAGNOSIS — R339 Retention of urine, unspecified: Secondary | ICD-10-CM

## 2013-03-31 DIAGNOSIS — F0280 Dementia in other diseases classified elsewhere without behavioral disturbance: Secondary | ICD-10-CM

## 2013-03-31 DIAGNOSIS — N390 Urinary tract infection, site not specified: Secondary | ICD-10-CM

## 2013-03-31 DIAGNOSIS — N138 Other obstructive and reflux uropathy: Secondary | ICD-10-CM

## 2013-03-31 DIAGNOSIS — A499 Bacterial infection, unspecified: Secondary | ICD-10-CM

## 2013-03-31 DIAGNOSIS — R410 Disorientation, unspecified: Secondary | ICD-10-CM | POA: Insufficient documentation

## 2013-03-31 DIAGNOSIS — B9689 Other specified bacterial agents as the cause of diseases classified elsewhere: Secondary | ICD-10-CM

## 2013-03-31 DIAGNOSIS — G309 Alzheimer's disease, unspecified: Secondary | ICD-10-CM | POA: Insufficient documentation

## 2013-03-31 DIAGNOSIS — N401 Enlarged prostate with lower urinary tract symptoms: Secondary | ICD-10-CM

## 2013-03-31 DIAGNOSIS — R404 Transient alteration of awareness: Secondary | ICD-10-CM

## 2013-03-31 MED ORDER — AMPICILLIN 250 MG PO CAPS: 250.0000 mg | ORAL_CAPSULE | Freq: Four times a day (QID) | ORAL | Status: AC

## 2013-03-31 MED ORDER — SACCHAROMYCES BOULARDII 250 MG PO CAPS: 250.0000 mg | ORAL_CAPSULE | Freq: Two times a day (BID) | ORAL | Status: AC

## 2013-03-31 NOTE — Assessment & Plan Note (Signed)
Due to UTI at this point.  Push po fluids.  Begin abx and florastor.

## 2013-03-31 NOTE — Assessment & Plan Note (Signed)
Begin on ampicillin and florastor.  Push po fluids.

## 2013-03-31 NOTE — Assessment & Plan Note (Signed)
Continue flomax at bedtime.

## 2013-03-31 NOTE — Assessment & Plan Note (Signed)
No longer on meds for dementia or psychosis. Worsened by delirium with UTI.

## 2013-03-31 NOTE — Assessment & Plan Note (Signed)
Due to iron deficiency.  Cont nu-iron daily

## 2013-03-31 NOTE — Progress Notes (Signed)
Patient ID: Kevin [name], male   DOB: Dec 02, 1925, 79 y.o.   MRN: [id number]  Chief Complaint: increased confusion  HPI:   79 yo male with h/o dementia, prior urinary retention, BPH, and anemia was seen for acute visit for increased confusion.  He had a urine specimen obtained which had a urinalysis positive for infection and a culture was then done which was positive for strep agalactiae infection.  He was saying he was going to go driving and therapy needed to work with him so he could walk b/c that was all that was stopping him from driving.  When seen, he said he felt bad, but denied any specific symptoms as in ROS.    Review of Systems:  Review of Systems  Constitutional: Positive for malaise/fatigue. Negative for fever and chills.  HENT: Negative for congestion.   Respiratory: Negative for cough and shortness of breath.   Cardiovascular: Negative for chest pain.  Gastrointestinal: Negative for nausea, vomiting and diarrhea.  Genitourinary: Negative for dysuria.       Incontinent  Musculoskeletal: Positive for myalgias and falls.  Skin: Negative for rash.  Neurological: Negative for loss of consciousness and weakness.  Endo/Heme/Allergies: Bruises/bleeds easily.  Psychiatric/Behavioral: Positive for depression and memory loss.    Medications: Patient's Medications  New Prescriptions   No medications on file  Previous Medications   CALCIUM CARB-CHOLECALCIFEROL (CALCIUM-VITAMIN D) 500-400 MG-UNIT TABS    Take 1 tablet by mouth daily.   IRON POLYSACCHARIDES (NIFEREX) 150 MG CAPSULE    Take 150 mg by mouth daily.   MELATONIN 3 MG CAPS    Take 1 capsule by mouth at bedtime as needed (difficulty sleeping).   POLYETHYLENE GLYCOL (MIRALAX / GLYCOLAX) PACKET    Take 17 g by mouth daily.   PROPYLENE GLYCOL (SYSTANE BALANCE) 0.6 % SOLN    Place 1 drop into both eyes 2 (two) times daily.   RANITIDINE (ZANTAC) 150 MG CAPSULE    Take 150 mg by mouth every evening.   TAMSULOSIN HCL (FLOMAX)  0.4 MG CAPS    Take 1 capsule (0.4 mg total) by mouth daily after supper.   TRAMADOL (ULTRAM) 50 MG TABLET    Take 50 mg by mouth every 6 (six) hours as needed. For pain   WHITE PETROLATUM-MINERAL OIL (SYSTANE NIGHTTIME OP)    Place 0.25 strips into both eyes at bedtime. Apply 1/4 " strip to both eyes at bedtime  Modified Medications   No medications on file  Discontinued Medications   ARIPIPRAZOLE 1 MG/ML ML    Take 1.5 mg by mouth daily.   DULOXETINE (CYMBALTA) 60 MG CAPSULE    Take 60 mg by mouth daily.   LAMOTRIGINE (LAMICTAL) 25 MG TABLET    Take 75 mg by mouth daily.   MAGNESIUM HYDROXIDE (MILK OF MAGNESIA) 400 MG/5ML SUSPENSION    Take 30 mLs by mouth daily as needed. For constipation   PANTOPRAZOLE (PROTONIX) 40 MG TABLET    Take 1 tablet (40 mg total) by mouth 2 (two) times daily before a meal.     Physical Exam: Physical Exam  Nursing note and vitals reviewed. Constitutional:  Frail caucasian male in NAD resting in his bed  HENT:  Head: Normocephalic and atraumatic.  Cardiovascular: Normal rate, regular rhythm and normal heart sounds.  Exam reveals no gallop and no friction rub.   No murmur heard. Pulmonary/Chest: Effort normal and breath sounds normal. No respiratory distress. He has no rales.  Abdominal: Soft. Bowel sounds are  normal. He exhibits no distension and no mass. There is no tenderness.  Genitourinary:  No suprapubic tenderness  Musculoskeletal: He exhibits no tenderness.  Neurological: He is alert.  Oriented to person only  Skin: Skin is warm and dry.  Psychiatric: His affect is blunt. He is withdrawn. He expresses inappropriate judgment. He exhibits abnormal recent memory. He is attentive.     Filed Vitals:   03/31/13 1347  BP: 132/78  Pulse: 77  Temp: 97.4 F (36.3 C)  Resp: 18  Height: 5\' 6"  (1.676 m)  Weight: 145 lb (65.772 kg)  SpO2: 92%      Labs reviewed: Basic Metabolic Panel:  Recent Labs  11/91/47 2259 06/29/12 0350  06/30/12 0505 07/01/12 0410  NA  --  142 136 137  K  --  4.0 3.6 3.1*  CL  --  107 106 105  CO2  --  29 21 24   GLUCOSE  --  90 82 94  BUN  --  28* 18 11  CREATININE  --  1.20 0.97 0.95  CALCIUM  --  9.7 9.0 9.2  MG 2.1  --   --   --     Liver Function Tests:  Recent Labs  06/27/12 2352 06/28/12 0644  AST 17 14  ALT 13 11  ALKPHOS 121* 103  BILITOT 0.2* 0.1*  PROT 8.9* 7.8  ALBUMIN 3.4* 3.0*    CBC:  Recent Labs  06/27/12 2352  06/29/12 0350 06/30/12 0505 07/01/12 0410  WBC 9.8  < > 8.4 5.9 4.7  NEUTROABS 8.2*  --  5.9 3.6  --   HGB 12.2*  < > 8.6* 7.5* 8.0*  HCT 36.9*  < > 26.7* 23.2* 24.0*  MCV 82.0  < > 83.4 81.7 80.8  PLT 534*  < > 383 322 330  < > = values in this interval not displayed.   Assessment/Plan BPH (benign prostatic hypertrophy) with urinary retention Continue flomax at bedtime.    Anemia Due to iron deficiency.  Cont nu-iron daily  Acute delirium Due to UTI at this point.  Push po fluids.  Begin abx and florastor.  Dementia in Alzheimer's disease No longer on meds for dementia or psychosis. Worsened by delirium with UTI.    UTI (urinary tract infection), bacterial Begin on ampicillin and florastor.  Push po fluids.     Family/ staff Communication:  Seen with unit supervisor and plans discussed with her.  Goals of care: Full code  Labs/tests ordered:  None--push po fluids, abx, probiotic

## 2013-04-07 DIAGNOSIS — K219 Gastro-esophageal reflux disease without esophagitis: Secondary | ICD-10-CM | POA: Insufficient documentation

## 2013-04-07 DIAGNOSIS — K5900 Constipation, unspecified: Secondary | ICD-10-CM | POA: Insufficient documentation

## 2013-04-07 DIAGNOSIS — F329 Major depressive disorder, single episode, unspecified: Secondary | ICD-10-CM | POA: Insufficient documentation

## 2013-04-07 NOTE — Progress Notes (Signed)
Patient ID: Kevin [name], male   DOB: [birth year], 79 y.o.   MRN: [id number]  CC- medical management of chronic illness  HPI- 79 y/o male patient is a long term resident of the facilityy and is seen today for routine follow up visit. He has alzhimer's dementia, BPH, anemia. He stays in his room most of the days but overall interaction with staff has improved. He has been feeling low with his mood and energy. He denies any other complaints today  PAST PROCEDURES PERFORMED 11-22-10: left wrist x-ray: osteopenia 11-29-10: chest x-ray: left lower lobe infiltrate; pacer.  06/28/12 egd- moderately large hiatal hernia, minimal old food and debris in stomach. otherwise within normal limits EGD without any signs of bleeding or at risk lesions  MEDICATIONS- have been reviewed  CODE STATUS- full code  Allergies  Allergen Reactions  . Morphine Other (See Comments)    Per MAR  . Wellbutrin (Bupropion)    Past Medical History  Diagnosis Date  . UTI (lower urinary tract infection)   . Cardiomyopathy   . Insomnia   . Obesity   . AICD (automatic cardioverter/defibrillator) present   . Hypertension   . Vitamin D deficiency   . Sleep apnea     no cpap  . Dementia   . Depressive disorder    Past Surgical History  Procedure Laterality Date  . Total knee arthroplasty      left  . Back surgery    . Cardiac defibrillator placement    . Esophagogastroduodenoscopy  06/28/2012    Procedure: ESOPHAGOGASTRODUODENOSCOPY (EGD);  Surgeon: Petra Kuba, MD;  Location: [facility] ENDOSCOPY;  Service: Endoscopy;  Laterality: N/A;   Review of Systems  Constitutional: Negative for fever, chills and weight loss.  HENT: Negative for congestion.   Eyes: Negative for blurred vision.  Respiratory: Negative for shortness of breath.   Cardiovascular: Negative for chest pain, palpitations and leg swelling.  Gastrointestinal: Negative for heartburn, nausea, vomiting, abdominal pain and constipation.  Genitourinary:  Negative for dysuria.  Musculoskeletal: Negative for falls.  Skin: Negative for rash.  Neurological: Negative for dizziness and weakness.  Psychiatric/Behavioral: Positive for depression and memory loss.   VITAL SIGNS- Temp 97.4, hr 74/min, bp 113/62, RR 18/min, weight 145 lb, height 66 inches, o2 sat 92% RA  Physical Exam  Constitutional: No distress.  Elderly male patient  HENT:  Head: Normocephalic and atraumatic.  Mouth/Throat: Oropharynx is clear and moist.  Eyes: Conjunctivae are normal. Pupils are equal, round, and reactive to light.  Neck: Normal range of motion. Neck supple. No JVD present. No tracheal deviation present. No thyromegaly present.  Cardiovascular: Normal rate, regular rhythm and normal heart sounds.   Pulmonary/Chest: Effort normal and breath sounds normal.  Abdominal: Soft. Bowel sounds are normal. He exhibits no distension. There is no tenderness.  Musculoskeletal: He exhibits no edema and no tenderness.  Lymphadenopathy:    He has no cervical adenopathy.  Neurological: He is alert.  Oriented to person only  Skin: Skin is warm and dry.  Psychiatric: He has a normal mood and affect.   LABS- CBC    Component Value Date/Time   WBC 4.7 07/01/2012 0410   RBC 2.97* 07/01/2012 0410   HGB 8.0* 07/01/2012 0410   HCT 24.0* 07/01/2012 0410   PLT 330 07/01/2012 0410   MCV 80.8 07/01/2012 0410   MCH 26.9 07/01/2012 0410   MCHC 33.3 07/01/2012 0410   RDW 14.2 07/01/2012 0410   LYMPHSABS 1.6 06/30/2012 0505   MONOABS  0.4 06/30/2012 0505   EOSABS 0.4 06/30/2012 0505   BASOSABS 0.0 06/30/2012 0505   CMP     Component Value Date/Time   NA 137 07/01/2012 0410   K 3.1* 07/01/2012 0410   CL 105 07/01/2012 0410   CO2 24 07/01/2012 0410   GLUCOSE 94 07/01/2012 0410   BUN 11 07/01/2012 0410   CREATININE 0.95 07/01/2012 0410   CALCIUM 9.2 07/01/2012 0410   PROT 7.8 06/28/2012 0644   ALBUMIN 3.0* 06/28/2012 0644   AST 14 06/28/2012 0644   ALT 11 06/28/2012 0644   ALKPHOS 103 06/28/2012 0644   BILITOT 0.1*  06/28/2012 0644   GFRNONAA 74* 07/01/2012 0410   GFRAA 86* 07/01/2012 0410   09-05-12: wbc 4.2; hgb 9.0; hct 28.6 ;mcv 74.7; plt 244; tsh 2.845 10-31-12: hgb ac 5.8   ASSESSMENT/PLAN-  Dementia- currently off all medication, monitor  Anemia- continue iron supplement, check cbc on routine basis  Depression- has flat affect. Will add remeron 15 mg daily  and continue melatonin for sleep aid  gerd- will continue zantac for now, symptoms under control  BPH- will continue flomax, symptoms under control, no changes made  Osteoarthritis- continue tramadol prn for pain and ca-vit d supplement  Labs ordered- cbc, cmp  Reviewed plan of care with nursing supervisor  Constipation- imporved. On iron supplement which could be contributing to this. Continue miralax

## 2013-06-19 ENCOUNTER — Non-Acute Institutional Stay (SKILLED_NURSING_FACILITY): Payer: Medicare Other | Admitting: Internal Medicine

## 2013-06-19 DIAGNOSIS — K219 Gastro-esophageal reflux disease without esophagitis: Secondary | ICD-10-CM

## 2013-06-19 DIAGNOSIS — M1990 Unspecified osteoarthritis, unspecified site: Secondary | ICD-10-CM

## 2013-06-19 DIAGNOSIS — N401 Enlarged prostate with lower urinary tract symptoms: Secondary | ICD-10-CM

## 2013-06-19 DIAGNOSIS — F411 Generalized anxiety disorder: Secondary | ICD-10-CM

## 2013-06-19 DIAGNOSIS — D649 Anemia, unspecified: Secondary | ICD-10-CM

## 2013-06-19 DIAGNOSIS — F0280 Dementia in other diseases classified elsewhere without behavioral disturbance: Secondary | ICD-10-CM

## 2013-06-19 DIAGNOSIS — G309 Alzheimer's disease, unspecified: Secondary | ICD-10-CM

## 2013-06-19 DIAGNOSIS — R339 Retention of urine, unspecified: Secondary | ICD-10-CM

## 2013-06-19 NOTE — Progress Notes (Signed)
Patient ID: Kevin [name], male   DOB: [birth year], 79 y.o.   MRN: [id number]  [name] living [place]  Code Status: full code  Allergies  Allergen Reactions  . Morphine Other (See Comments)    Per MAR  . Wellbutrin (Bupropion)     Chief Complaint  Patient presents with  . Medical Managment of Chronic Issues    HPI:  79 y/o male patient see today for routine visit. Recently completed treatment for UTI.  Mood has been stable No concerns from staff He denies any complaints  Review of Systems:  Constitutional: Negative for fever, chills and weight loss.  HENT: Negative for congestion.   Eyes: Negative for blurred vision.  Respiratory: Negative for shortness of breath.   Cardiovascular: Negative for chest pain, palpitations and leg swelling.  Gastrointestinal: Negative for heartburn, nausea, vomiting, abdominal pain and constipation.  Genitourinary: Negative for dysuria.  Musculoskeletal: Negative for falls.  Skin: Negative for rash.  Neurological: Negative for dizziness and weakness.  Psychiatric/Behavioral: Positive for depression and memory loss.    Past Medical History  Diagnosis Date  . UTI (lower urinary tract infection)   . Cardiomyopathy   . Insomnia   . Obesity   . AICD (automatic cardioverter/defibrillator) present   . Hypertension   . Vitamin D deficiency   . Sleep apnea     no cpap  . Dementia   . Depressive disorder    Past Surgical History  Procedure Laterality Date  . Total knee arthroplasty      left  . Back surgery    . Cardiac defibrillator placement    . Esophagogastroduodenoscopy  06/28/2012    Procedure: ESOPHAGOGASTRODUODENOSCOPY (EGD);  Surgeon: Petra Kuba, MD;  Location: [facility] ENDOSCOPY;  Service: Endoscopy;  Laterality: N/A;   Social History:   reports that he has never smoked. He does not have any smokeless tobacco history on file. He reports that he does not drink alcohol or use illicit drugs.  Family History  Problem Relation Age of Onset   . Stroke Mother   . Lung cancer Brother   . Brain cancer Brother     Medications: Patient's Medications  New Prescriptions   No medications on file  Previous Medications   CALCIUM CARB-CHOLECALCIFEROL (CALCIUM-VITAMIN D) 500-400 MG-UNIT TABS    Take 1 tablet by mouth daily.   IRON POLYSACCHARIDES (NIFEREX) 150 MG CAPSULE    Take 150 mg by mouth daily.   LORAZEPAM (ATIVAN) 0.5 MG TABLET    Take 0.5 mg by mouth every 6 (six) hours as needed for anxiety. Gel every 6 hours as needed   MELATONIN 3 MG CAPS    Take 1 capsule by mouth at bedtime as needed (difficulty sleeping).   POLYETHYLENE GLYCOL (MIRALAX / GLYCOLAX) PACKET    Take 17 g by mouth daily.   PROPYLENE GLYCOL (SYSTANE BALANCE) 0.6 % SOLN    Place 1 drop into both eyes 2 (two) times daily.   RANITIDINE (ZANTAC) 150 MG CAPSULE    Take 150 mg by mouth every evening.   SACCHAROMYCES BOULARDII (FLORASTOR) 250 MG CAPSULE    Take 250 mg by mouth 2 (two) times daily.   TAMSULOSIN HCL (FLOMAX) 0.4 MG CAPS    Take 1 capsule (0.4 mg total) by mouth daily after supper.   TRAMADOL (ULTRAM) 50 MG TABLET    Take 50 mg by mouth every 6 (six) hours as needed. For pain   WHITE PETROLATUM-MINERAL OIL (SYSTANE NIGHTTIME OP)    Place 0.25  strips into both eyes at bedtime. Apply 1/4 " strip to both eyes at bedtime  Modified Medications   No medications on file  Discontinued Medications   No medications on file    Physical Exam: Filed Vitals:   06/19/13 1345  BP: 113/62  Pulse: 73  Temp: 97.1 F (36.2 C)  Resp: 18  Height: 5\' 6"  (1.676 m)  Weight: 145 lb (65.772 kg)  SpO2: 92%   Constitutional: No distress.  Elderly male patient  HENT:   Head: Normocephalic and atraumatic.   Mouth/Throat: Oropharynx is clear and moist.  Eyes: Conjunctivae are normal. Pupils are equal, round, and reactive to light.  Neck: Normal range of motion. Neck supple. No JVD present. No tracheal deviation present. No thyromegaly present.  Cardiovascular: Normal  rate, regular rhythm and normal heart sounds.   Pulmonary/Chest: Effort normal and breath sounds normal.  Abdominal: Soft. Bowel sounds are normal. He exhibits no distension. There is no tenderness.  Musculoskeletal: He exhibits no edema and no tenderness.  Lymphadenopathy:    He has no cervical adenopathy.  Neurological: He is alert.  Oriented to person only  Skin: Skin is warm and dry.  Psychiatric: He has a normal mood and affect.   Labs reviewed: Basic Metabolic Panel:  Recent Labs  16/10/96 2259 06/29/12 0350 06/30/12 0505 07/01/12 0410  NA  --  142 136 137  K  --  4.0 3.6 3.1*  CL  --  107 106 105  CO2  --  29 21 24   GLUCOSE  --  90 82 94  BUN  --  28* 18 11  CREATININE  --  1.20 0.97 0.95  CALCIUM  --  9.7 9.0 9.2  MG 2.1  --   --   --    Liver Function Tests:  Recent Labs  06/27/12 2352 06/28/12 0644  AST 17 14  ALT 13 11  ALKPHOS 121* 103  BILITOT 0.2* 0.1*  PROT 8.9* 7.8  ALBUMIN 3.4* 3.0*    Recent Labs  06/27/12 2352  LIPASE 30   No results found for this basename: AMMONIA,  in the last 8760 hours CBC:  Recent Labs  06/27/12 2352  06/29/12 0350 06/30/12 0505 07/01/12 0410  WBC 9.8  < > 8.4 5.9 4.7  NEUTROABS 8.2*  --  5.9 3.6  --   HGB 12.2*  < > 8.6* 7.5* 8.0*  HCT 36.9*  < > 26.7* 23.2* 24.0*  MCV 82.0  < > 83.4 81.7 80.8  PLT 534*  < > 383 322 330  < > = values in this interval not displayed.  03/30/13 na 140, k 3.9, bun 23, cr 1.06, glu 107, wbc 6, hb 11.3, hct 35.8, plt 271, mcv 75.8  PAST PROCEDURES PERFORMED 11-22-10: left wrist x-ray: osteopenia 11-29-10: chest x-ray: left lower lobe infiltrate; pacer.   06/28/12 egd- moderately large hiatal hernia, minimal old food and debris in stomach. otherwise within normal limits EGD without any signs of bleeding or at risk lesions  Assessment/Plan  gerd-Symptoms under control. Decrease rantidine to 75 mg daily for a week and then stop it  Dementia- currently off all medication,  monitor  Anxiety- on prn lorazepam for severe anxiety and melatonin to help him sleep at night, stable  Anemia- continue iron supplement, check cbc on routine basis  BPH- will continue flomax, symptoms under control, no changes made  Osteoarthritis- continue tramadol prn for pain and ca-vit d supplement  Labs ordered- cbc, cmp in 6 months  Reviewed plan of care with nursing supervisor  Constipation- imporved. On iron supplement which could be contributing to this. Continue miralax  Labs/tests ordered- cbc, bmp in 3 months

## 2013-07-04 DIAGNOSIS — M1990 Unspecified osteoarthritis, unspecified site: Secondary | ICD-10-CM | POA: Insufficient documentation

## 2013-07-04 DIAGNOSIS — F411 Generalized anxiety disorder: Secondary | ICD-10-CM | POA: Insufficient documentation

## 2013-08-07 ENCOUNTER — Encounter: Payer: Self-pay | Admitting: Internal Medicine

## 2013-08-07 ENCOUNTER — Non-Acute Institutional Stay (SKILLED_NURSING_FACILITY): Payer: Medicare Other | Admitting: Internal Medicine

## 2013-08-07 DIAGNOSIS — M1990 Unspecified osteoarthritis, unspecified site: Secondary | ICD-10-CM

## 2013-08-07 DIAGNOSIS — D649 Anemia, unspecified: Secondary | ICD-10-CM

## 2013-08-07 DIAGNOSIS — N401 Enlarged prostate with lower urinary tract symptoms: Secondary | ICD-10-CM

## 2013-08-07 DIAGNOSIS — R339 Retention of urine, unspecified: Secondary | ICD-10-CM

## 2013-08-07 DIAGNOSIS — F0280 Dementia in other diseases classified elsewhere without behavioral disturbance: Secondary | ICD-10-CM

## 2013-08-07 DIAGNOSIS — K5900 Constipation, unspecified: Secondary | ICD-10-CM

## 2013-08-07 DIAGNOSIS — K219 Gastro-esophageal reflux disease without esophagitis: Secondary | ICD-10-CM

## 2013-08-07 DIAGNOSIS — G309 Alzheimer's disease, unspecified: Secondary | ICD-10-CM

## 2013-08-07 DIAGNOSIS — F411 Generalized anxiety disorder: Secondary | ICD-10-CM

## 2013-08-07 NOTE — Progress Notes (Signed)
Patient ID: Kevin [name], male   DOB: Oct 31, 1925, 79 y.o.   MRN: [id number]  [name] living [place]  Code Status: full code   Allergies  Allergen Reactions  . Morphine Other (See Comments)    Per MAR  . Wellbutrin [Bupropion]     Chief Complaint  Patient presents with  . Annual Exam    HPI:  79 y/o male patient is a long term care resident. He is alert and oriented to person only. He is in no distress. He refuses to take his medications mostly. He has dementia. He is able to feed himself. He rarely is OOB and wheels himself around  Review of Systems:  Constitutional: Negative for fever, chills and weight loss.   HENT: Negative for congestion.    Eyes: Negative for blurred vision.   Respiratory: Negative for shortness of breath.    Cardiovascular: Negative for chest pain, palpitations and leg swelling.   Gastrointestinal: Negative for heartburn, nausea, vomiting, abdominal pain and constipation.   Genitourinary: Negative for dysuria.   Musculoskeletal: Negative for falls.   Skin: Negative for rash.   Neurological: Negative for dizziness and weakness.   Psychiatric/Behavioral: Positive for depression and memory loss.    Past Medical History  Diagnosis Date  . UTI (lower urinary tract infection)   . Cardiomyopathy   . Insomnia   . Obesity   . AICD (automatic cardioverter/defibrillator) present   . Hypertension   . Vitamin D deficiency   . Sleep apnea     no cpap  . Dementia   . Depressive disorder    Past Surgical History  Procedure Laterality Date  . Total knee arthroplasty      left  . Back surgery    . Cardiac defibrillator placement    . Esophagogastroduodenoscopy  06/28/2012    Procedure: ESOPHAGOGASTRODUODENOSCOPY (EGD);  Surgeon: Petra Kuba, MD;  Location: [facility] ENDOSCOPY;  Service: Endoscopy;  Laterality: N/A;   Social History:   reports that he has never smoked. He does not have any smokeless tobacco history on file. He reports that he does not drink alcohol  or use illicit drugs.  Family History  Problem Relation Age of Onset  . Stroke Mother   . Lung cancer Brother   . Brain cancer Brother     Medications: Patient's Medications  New Prescriptions   No medications on file  Previous Medications   CALCIUM CARB-CHOLECALCIFEROL (CALCIUM-VITAMIN D) 500-400 MG-UNIT TABS    Take 1 tablet by mouth daily.   IRON POLYSACCHARIDES (NIFEREX) 150 MG CAPSULE    Take 150 mg by mouth daily.   LORAZEPAM (ATIVAN) 0.5 MG TABLET    Take 0.5 mg by mouth every 6 (six) hours as needed for anxiety. Gel every 6 hours as needed   MELATONIN 3 MG CAPS    Take 1 capsule by mouth at bedtime as needed (difficulty sleeping).   POLYETHYLENE GLYCOL (MIRALAX / GLYCOLAX) PACKET    Take 17 g by mouth daily.   PROPYLENE GLYCOL (SYSTANE BALANCE) 0.6 % SOLN    Place 1 drop into both eyes 2 (two) times daily.   TAMSULOSIN HCL (FLOMAX) 0.4 MG CAPS    Take 1 capsule (0.4 mg total) by mouth daily after supper.   TRAMADOL (ULTRAM) 50 MG TABLET    Take 50 mg by mouth every 6 (six) hours as needed. For pain   WHITE PETROLATUM-MINERAL OIL (SYSTANE NIGHTTIME OP)    Place 0.25 strips into both eyes at bedtime. Apply 1/4 "  strip to both eyes at bedtime  Modified Medications   No medications on file  Discontinued Medications   RANITIDINE (ZANTAC) 150 MG CAPSULE    Take 150 mg by mouth every evening.   SACCHAROMYCES BOULARDII (FLORASTOR) 250 MG CAPSULE    Take 250 mg by mouth 2 (two) times daily.     Physical Exam: Filed Vitals:   08/07/13 1349  BP: 148/73  Pulse: 80  Temp: 97.6 F (36.4 C)  Resp: 18  Height: 5\' 6"  (1.676 m)  Weight: 146 lb (66.225 kg)  SpO2: 92%   Constitutional: No distress.  Elderly male patient   HENT:   Head: Normocephalic and atraumatic.   Mouth/Throat: Oropharynx is clear and moist.   Eyes: Conjunctivae are normal. Pupils are equal, round, and reactive to light.   Neck: Normal range of motion. Neck supple. No JVD present. No tracheal deviation  present. No thyromegaly present.   Cardiovascular: Normal rate, regular rhythm and normal heart sounds.    Pulmonary/Chest: Effort normal and breath sounds normal.   Abdominal: Soft. Bowel sounds are normal. He exhibits no distension. There is no tenderness.  Musculoskeletal: He exhibits no edema and no tenderness.  Lymphadenopathy:    He has no cervical adenopathy.  Neurological: He is alert.  Oriented to person only   Skin: Skin is warm and dry.  Psychiatric: He has a normal mood and affect.   Labs reviewed:  03/30/13 na 140, k 3.9, bun 23, cr 1.06, glu 107, wbc 6, hb 11.3, hct 35.8, plt 271, mcv 75.8  PAST PROCEDURES PERFORMED 11-22-10: left wrist x-ray: osteopenia 11-29-10: chest x-ray: left lower lobe infiltrate; pacer.   06/28/12 egd- moderately large hiatal hernia, minimal old food and debris in stomach. otherwise within normal limits EGD without any signs of bleeding or at risk lesions   Assessment/Plan  SBP slightly elevated in few readings. Given his med non compliance, will monitor it for now and if persistently elevated, consider antihypertensives  Dementia- currently off all medication, monitor. Fall precautions, redirections and group activities encouraged. Continue nutritional supplement. Skin care  Anxiety- on prn lorazepam for severe anxiety and melatonin to help him sleep at night, stable  gerd-Symptoms under control. Off ranitidine and tolerating it well  Osteoarthritis- continue tramadol prn for pain and ca-vit d supplement. Fall precautions  Anemia- continue iron supplement, check cbc on routine basis if patient agrees  Constipation- imporved. On iron supplement and tramadol which could be contributing to this. Continue miralax  BPH- will continue flomax, symptoms under control, no changes made   Labs ordered- cbc, cmp routine  Reviewed plan of care with nursing supervisor

## 2013-09-02 ENCOUNTER — Non-Acute Institutional Stay (SKILLED_NURSING_FACILITY): Payer: Medicare Other | Admitting: Adult Health

## 2013-09-02 DIAGNOSIS — N401 Enlarged prostate with lower urinary tract symptoms: Secondary | ICD-10-CM

## 2013-09-02 DIAGNOSIS — F0280 Dementia in other diseases classified elsewhere without behavioral disturbance: Secondary | ICD-10-CM

## 2013-09-02 DIAGNOSIS — F411 Generalized anxiety disorder: Secondary | ICD-10-CM

## 2013-09-02 DIAGNOSIS — I10 Essential (primary) hypertension: Secondary | ICD-10-CM

## 2013-09-02 DIAGNOSIS — R339 Retention of urine, unspecified: Secondary | ICD-10-CM

## 2013-09-02 DIAGNOSIS — M1990 Unspecified osteoarthritis, unspecified site: Secondary | ICD-10-CM

## 2013-09-02 DIAGNOSIS — D649 Anemia, unspecified: Secondary | ICD-10-CM

## 2013-09-02 DIAGNOSIS — K5900 Constipation, unspecified: Secondary | ICD-10-CM

## 2013-09-28 ENCOUNTER — Encounter: Payer: Self-pay | Admitting: Adult Health

## 2013-09-28 NOTE — Assessment & Plan Note (Signed)
Will continue miralax daily  

## 2013-09-28 NOTE — Assessment & Plan Note (Signed)
No significant change in his overall status he does spend all of his time in bed per his choice; he is not taking medications at this time; will not make changes at this time and will monitor his status

## 2013-09-28 NOTE — Assessment & Plan Note (Signed)
He is presently denying any pain; has ultram 50 mg every 6 hours as needed for pain and will monitor

## 2013-09-28 NOTE — Assessment & Plan Note (Signed)
Is without change will continue flomax 0.4 mg daily and will monitor

## 2013-09-28 NOTE — Assessment & Plan Note (Signed)
Will continue iron supplement daily and will monitor

## 2013-09-28 NOTE — Progress Notes (Signed)
Patient ID: Kevin [name], male   DOB: [birth year], 79 y.o.   MRN: [id number] [facility]  Allergies  Allergen Reactions  . Morphine Other (See Comments)    Per MAR  . Wellbutrin [Bupropion]      Chief Complaint  Patient presents with  . Medical Managment of Chronic Issues    HPI: He is being seen for the management of his chronic illnesses. The nursing staff is not voicing any concerns at this time. He is unable to fully participate in the hpi or ros; as he won't make any complaints to me. There is no significant change in his overall status.   Past Medical History  Diagnosis Date  . UTI (lower urinary tract infection)   . Cardiomyopathy   . Insomnia   . Obesity   . AICD (automatic cardioverter/defibrillator) present   . Hypertension   . Vitamin D deficiency   . Sleep apnea     no cpap  . Dementia   . Depressive disorder     Past Surgical History  Procedure Laterality Date  . Total knee arthroplasty      left  . Back surgery    . Cardiac defibrillator placement    . Esophagogastroduodenoscopy  06/28/2012    Procedure: ESOPHAGOGASTRODUODENOSCOPY (EGD);  Surgeon: Petra Kuba, MD;  Location: [facility] ENDOSCOPY;  Service: Endoscopy;  Laterality: N/A;    VITAL SIGNS BP 117/76  Pulse 98  Ht 5\' 6"  (1.676 m)  Wt 142 lb (64.411 kg)  BMI 22.93 kg/m2   Patient's Medications  New Prescriptions   No medications on file  Previous Medications   CALCIUM CARB-CHOLECALCIFEROL (CALCIUM-VITAMIN D) 500-400 MG-UNIT TABS    Take 1 tablet by mouth daily.   IRON POLYSACCHARIDES (NIFEREX) 150 MG CAPSULE    Take 150 mg by mouth daily.   LORAZEPAM (ATIVAN) 0.5 MG TABLET    Take 0.5 mg by mouth every 6 (six) hours as needed for anxiety. Gel every 6 hours as needed   MELATONIN 3 MG CAPS    Take 1 capsule by mouth at bedtime as needed (difficulty sleeping).   POLYETHYLENE GLYCOL (MIRALAX / GLYCOLAX) PACKET    Take 17 g by mouth daily.   PROPYLENE GLYCOL (SYSTANE BALANCE) 0.6 % SOLN    Place 1  drop into both eyes 2 (two) times daily.   TAMSULOSIN HCL (FLOMAX) 0.4 MG CAPS    Take 1 capsule (0.4 mg total) by mouth daily after supper.   TRAMADOL (ULTRAM) 50 MG TABLET    Take 50 mg by mouth every 6 (six) hours as needed. For pain   WHITE PETROLATUM-MINERAL OIL (SYSTANE NIGHTTIME OP)    Place 0.25 strips into both eyes at bedtime. Apply 1/4 " strip to both eyes at bedtime  Modified Medications   No medications on file  Discontinued Medications   No medications on file    SIGNIFICANT DIAGNOSTIC EXAMS   LABS REVIEWED:   03-30-13: wbc 6.0; hgb 11.3; hct 35.8; 75.8; plt 271; glucose 107; bun 23; creat 1.06; k+3.9; na++140; liver normal albumin 4.1 06-06-13: urine culture: providencia stuartii; p. Mirabilis: ceftriaxone.    Review of Systems  Unable to perform ROS   Physical Exam  Constitutional: He appears well-developed and well-nourished.  Neck: Neck supple. No JVD present.  Cardiovascular: Normal rate, regular rhythm and intact distal pulses.   Respiratory: Effort normal and breath sounds normal. No respiratory distress. He has no wheezes.  GI: Soft. Bowel sounds are normal. He exhibits no distension.  There is no tenderness.  Musculoskeletal: He exhibits no edema.  Is able to move all extremities   Neurological: He is alert.  Skin: Skin is warm and dry.     ASSESSMENT/ PLAN:   Hypertension Is stable is not taking medications at this time; will continue to monitor his status and will make changes as indicated  Unspecified constipation Will continue miralax daily  Anemia Will continue iron supplement daily and will monitor  Anxiety state, unspecified He is emotionally stable will continue ativan gel 0.5 mg every 6 hours as needed and will continue to monitor   BPH (benign prostatic hypertrophy) with urinary retention Is without change will continue flomax 0.4 mg daily and will monitor   Osteoarthritis He is presently denying any pain; has ultram 50 mg every 6  hours as needed for pain and will monitor   Dementia in Alzheimer's disease No significant change in his overall status he does spend all of his time in bed per his choice; he is not taking medications at this time; will not make changes at this time and will monitor his status

## 2013-09-28 NOTE — Assessment & Plan Note (Signed)
Is stable is not taking medications at this time; will continue to monitor his status and will make changes as indicated

## 2013-09-28 NOTE — Assessment & Plan Note (Signed)
He is emotionally stable will continue ativan gel 0.5 mg every 6 hours as needed and will continue to monitor

## 2013-10-07 ENCOUNTER — Non-Acute Institutional Stay (SKILLED_NURSING_FACILITY): Payer: Medicare Other | Admitting: Adult Health

## 2013-10-07 DIAGNOSIS — F0280 Dementia in other diseases classified elsewhere without behavioral disturbance: Secondary | ICD-10-CM

## 2013-10-07 DIAGNOSIS — K5900 Constipation, unspecified: Secondary | ICD-10-CM

## 2013-10-07 DIAGNOSIS — N401 Enlarged prostate with lower urinary tract symptoms: Secondary | ICD-10-CM

## 2013-10-07 DIAGNOSIS — M1990 Unspecified osteoarthritis, unspecified site: Secondary | ICD-10-CM

## 2013-10-07 DIAGNOSIS — D649 Anemia, unspecified: Secondary | ICD-10-CM

## 2013-10-07 DIAGNOSIS — I10 Essential (primary) hypertension: Secondary | ICD-10-CM

## 2013-10-07 DIAGNOSIS — R339 Retention of urine, unspecified: Secondary | ICD-10-CM

## 2013-10-07 DIAGNOSIS — F411 Generalized anxiety disorder: Secondary | ICD-10-CM

## 2013-10-08 ENCOUNTER — Encounter: Payer: Self-pay | Admitting: Adult Health

## 2013-10-08 NOTE — Progress Notes (Signed)
Patient ID: Kevin [name], male   DOB: 06/12/1925, 79 y.o.   MRN: [id number]  [facility]  Allergies  Allergen Reactions  . Morphine Other (See Comments)    Per MAR  . Wellbutrin [Bupropion]     Chief Complaint  Patient presents with  . Medical Managment of Chronic Issues    HPI: He is being seen for the management of his chronic illnesses. The nursing staff is not voicing any concerns at this time. He is unable to fully participate in the hpi or ros; as he won't make any complaints to me. There is no significant change in his overall status.   Past Medical History  Diagnosis Date  . UTI (lower urinary tract infection)   . Cardiomyopathy   . Insomnia   . Obesity   . AICD (automatic cardioverter/defibrillator) present   . Hypertension   . Vitamin D deficiency   . Sleep apnea     no cpap  . Dementia   . Depressive disorder     Past Surgical History  Procedure Laterality Date  . Total knee arthroplasty      left  . Back surgery    . Cardiac defibrillator placement    . Esophagogastroduodenoscopy  06/28/2012    Procedure: ESOPHAGOGASTRODUODENOSCOPY (EGD);  Surgeon: Petra Kuba, MD;  Location: [facility] ENDOSCOPY;  Service: Endoscopy;  Laterality: N/A;    Filed Vitals:   10/07/13 1619  BP: 128/68  Pulse: 76  Height: 5\' 6"  (1.676 m)  Weight: 143 lb (64.864 kg)     Patient's Medications  New Prescriptions   No medications on file  Previous Medications   CALCIUM CARB-CHOLECALCIFEROL (CALCIUM-VITAMIN D) 500-400 MG-UNIT TABS    Take 1 tablet by mouth daily.   IRON POLYSACCHARIDES (NIFEREX) 150 MG CAPSULE    Take 150 mg by mouth daily.   LORAZEPAM (ATIVAN) 0.5 MG TABLET    Take 0.5 mg by mouth every 6 (six) hours as needed for anxiety. Gel every 6 hours as needed   MELATONIN 3 MG CAPS    Take 1 capsule by mouth at bedtime as needed (difficulty sleeping).   POLYETHYLENE GLYCOL (MIRALAX / GLYCOLAX) PACKET    Take 17 g by mouth daily.   PROPYLENE GLYCOL (SYSTANE BALANCE) 0.6 %  SOLN    Place 1 drop into both eyes 2 (two) times daily.   TAMSULOSIN HCL (FLOMAX) 0.4 MG CAPS    Take 1 capsule (0.4 mg total) by mouth daily after supper.   TRAMADOL (ULTRAM) 50 MG TABLET    Take 50 mg by mouth every 6 (six) hours as needed. For pain   WHITE PETROLATUM-MINERAL OIL (SYSTANE NIGHTTIME OP)    Place 0.25 strips into both eyes at bedtime. Apply 1/4 " strip to both eyes at bedtime  Modified Medications   No medications on file  Discontinued Medications   No medications on file    SIGNIFICANT DIAGNOSTIC EXAMS   LABS REVIEWED:   03-30-13: wbc 6.0; hgb 11.3; hct 35.8; 75.8; plt 271; glucose 107; bun 23; creat 1.06; k+3.9; na++140; liver normal albumin 4.1 06-06-13: urine culture: providencia stuartii; p. Mirabilis: ceftriaxone.  09-18-13: wbc 3.0; hgb 9.6; hct 29.8; mcv 74.9; plt 215;glucose 103; bun 19; creat 0.95; k+4.1;na++141   Review of Systems  Unable to perform ROS   Physical Exam  Constitutional: He appears well-developed and well-nourished.  Neck: Neck supple. No JVD present.  Cardiovascular: Normal rate, regular rhythm and intact distal pulses.   Respiratory: Effort normal and breath  sounds normal. No respiratory distress. He has no wheezes.  GI: Soft. Bowel sounds are normal. He exhibits no distension. There is no tenderness.  Musculoskeletal: He exhibits no edema.  Is able to move all extremities   Neurological: He is alert.  Skin: Skin is warm and dry.     ASSESSMENT/ PLAN:   Hypertension Is stable is not taking medications at this time; will continue to monitor his status and will make changes as indicated  Unspecified constipation Will continue miralax daily  Anemia Will continue iron supplement daily and will monitor  Anxiety state, unspecified He is emotionally stable will continue ativan gel 0.5 mg every 6 hours as needed and will continue to monitor   BPH (benign prostatic hypertrophy) with urinary retention Is without change will continue  flomax 0.4 mg daily and will monitor   Osteoarthritis He is presently denying any pain; has ultram 50 mg every 6 hours as needed for pain and will monitor   Dementia in Alzheimer's disease No significant change in his overall status he does spend all of his time in bed per his choice; he is not taking medications at this time; will not make changes at this time and will monitor his status

## 2013-10-30 ENCOUNTER — Encounter: Payer: Self-pay | Admitting: Internal Medicine

## 2013-10-30 ENCOUNTER — Telehealth: Payer: Self-pay | Admitting: Internal Medicine

## 2013-10-30 NOTE — Telephone Encounter (Signed)
Pt's number d/c, sent letter to set up pacer fu appt with dr Ladona Ridgel, former [facility] pt/mt

## 2013-12-07 ENCOUNTER — Encounter: Payer: Self-pay | Admitting: Internal Medicine

## 2013-12-07 ENCOUNTER — Non-Acute Institutional Stay (SKILLED_NURSING_FACILITY): Payer: Medicare Other | Admitting: Internal Medicine

## 2013-12-07 DIAGNOSIS — R339 Retention of urine, unspecified: Secondary | ICD-10-CM

## 2013-12-07 DIAGNOSIS — M1990 Unspecified osteoarthritis, unspecified site: Secondary | ICD-10-CM

## 2013-12-07 DIAGNOSIS — F0280 Dementia in other diseases classified elsewhere without behavioral disturbance: Secondary | ICD-10-CM

## 2013-12-07 DIAGNOSIS — K5900 Constipation, unspecified: Secondary | ICD-10-CM

## 2013-12-07 DIAGNOSIS — N401 Enlarged prostate with lower urinary tract symptoms: Secondary | ICD-10-CM

## 2013-12-07 DIAGNOSIS — D649 Anemia, unspecified: Secondary | ICD-10-CM

## 2013-12-07 NOTE — Progress Notes (Signed)
Patient ID: Kevin [name], male   DOB: 1925-11-09, 79 y.o.   MRN: [id number]    [name] living [employer]  Chief Complaint  Patient presents with  . Medical Managment of Chronic Issues   Allergies  Allergen Reactions  . Morphine Other (See Comments)    Per MAR  . Wellbutrin [Bupropion]    HPI:   79 y/o male patient is a long term care resident. He is alert and oriented to person only. He is in no distress and has dementia. He refuses to take his medications mostly.   Review of Systems:  Constitutional: Negative for fever, chills and weight loss.   HENT: Negative for congestion.    Eyes: Negative for blurred vision.   Respiratory: Negative for shortness of breath.    Cardiovascular: Negative for chest pain, palpitations and leg swelling.   Gastrointestinal: Negative for heartburn, nausea, vomiting, abdominal pain and constipation.   Genitourinary: Negative for dysuria.   Musculoskeletal: Negative for falls.   Skin: Negative for rash.   Neurological: Negative for dizziness and weakness.   Psychiatric/Behavioral: has memory loss  Medication reviewed. See [facility]   Physical exam BP 100/62  Pulse 88  Temp(Src) 97.1 F (36.2 C)  Resp 18  Ht 5\' 6"  (1.676 m)  Wt 147 lb (66.679 kg)  BMI 23.74 kg/m2  SpO2 97%  Constitutional: No distress. Elderly male patient   HENT:   Head: Normocephalic and atraumatic.   Mouth/Throat: Oropharynx is clear and moist.   Eyes: Conjunctivae are normal. Pupils are equal, round, and reactive to light.   Neck: Normal range of motion. Neck supple. No JVD present. No tracheal deviation present. No thyromegaly present.   Cardiovascular: Normal rate, regular rhythm and normal heart sounds.    Pulmonary/Chest: Effort normal and breath sounds normal.   Abdominal: Soft. Bowel sounds are normal. He exhibits no distension. There is no tenderness.  Musculoskeletal: He exhibits no edema and no tenderness.  Lymphadenopathy:    He has no cervical adenopathy.    Neurological: He is alert. Oriented to person only   Skin: Skin is warm and dry.  Psychiatric: He has a normal mood and affect.  Labs reviewed:  03/30/13 na 140, k 3.9, bun 23, cr 1.06, glu 107, wbc 6, hb 11.3, hct 35.8, plt 271, mcv 75.8   Assessment/Plan  Osteoarthritis- continue tramadol prn for pain and ca-vit d supplement. Fall precautions  BPH- will continue flomax, symptoms under control, no changes made  Anemia- continue iron supplement  Constipation- improved with miralax, continue and monitor  Dementia- currently off all medication, monitor. Fall precautions, skin care and monitor oral intake

## 2014-01-01 ENCOUNTER — Encounter: Payer: Self-pay | Admitting: Internal Medicine

## 2014-01-01 ENCOUNTER — Non-Acute Institutional Stay (SKILLED_NURSING_FACILITY): Payer: Medicare Other | Admitting: Internal Medicine

## 2014-01-01 DIAGNOSIS — R339 Retention of urine, unspecified: Secondary | ICD-10-CM

## 2014-01-01 DIAGNOSIS — K5900 Constipation, unspecified: Secondary | ICD-10-CM

## 2014-01-01 DIAGNOSIS — R338 Other retention of urine: Secondary | ICD-10-CM

## 2014-01-01 DIAGNOSIS — D649 Anemia, unspecified: Secondary | ICD-10-CM

## 2014-01-01 DIAGNOSIS — N138 Other obstructive and reflux uropathy: Secondary | ICD-10-CM

## 2014-01-01 DIAGNOSIS — N401 Enlarged prostate with lower urinary tract symptoms: Secondary | ICD-10-CM

## 2014-01-01 DIAGNOSIS — M1990 Unspecified osteoarthritis, unspecified site: Secondary | ICD-10-CM

## 2014-01-01 NOTE — Progress Notes (Signed)
Patient ID: Kevin [name], male   DOB: 1925/11/18, 80 y.o.   MRN: [id number]     Kevin [name] living [employer]  Chief Complaint  Patient presents with  . Medical Managment of Chronic Issues    HPI:   80 y/o male patient is a long term care resident. He is alert and oriented to person only. He is in no distress. He refuses to take his medications mostly. He has dementia. He is able to feed himself.   Review of Systems:  Constitutional: Negative for fever, chills and weight loss.   HENT: Negative for congestion.    Eyes: Negative for blurred vision.   Respiratory: Negative for shortness of breath.    Cardiovascular: Negative for chest pain, palpitations and leg swelling.   Gastrointestinal: Negative for heartburn, nausea, vomiting, abdominal pain and constipation.   Genitourinary: Negative for dysuria.   Musculoskeletal: Negative for falls.   Skin: Negative for rash.   Neurological: Negative for dizziness and weakness.   Psychiatric/Behavioral: Positive for memory loss.   Medication reviewed. See [facility]  Past Medical History  Diagnosis Date  . UTI (lower urinary tract infection)   . Cardiomyopathy   . Insomnia   . Obesity   . AICD (automatic cardioverter/defibrillator) present   . Hypertension   . Vitamin D deficiency   . Sleep apnea     no cpap  . Dementia   . Depressive disorder     Physical exam BP 106/65  Pulse 88  Temp(Src) 97.3 F (36.3 C)  Resp 18  Wt 144 lb (65.318 kg)  SpO2 96%  Constitutional: No distress. Elderly male patient   HENT:   Head: Normocephalic and atraumatic.   Mouth/Throat: Oropharynx is clear and moist.   Eyes: Conjunctivae are normal. Pupils are equal, round, and reactive to light.   Neck: Normal range of motion. Neck supple. No JVD present. No tracheal deviation present. No thyromegaly present.   Cardiovascular: Normal rate, regular rhythm and normal heart sounds.    Pulmonary/Chest: Effort normal and breath sounds normal.   Abdominal:  Soft. Bowel sounds are normal. He exhibits no distension. There is no tenderness.  Musculoskeletal: He exhibits no edema and no tenderness.  Lymphadenopathy:    He has no cervical adenopathy.  Neurological: He is alert. Oriented to person only   Skin: Skin is warm and dry.  Psychiatric: He has a normal mood and affect.  Labs reviewed:  03/30/13 na 140, k 3.9, bun 23, cr 1.06, glu 107, wbc 6, hb 11.3, hct 35.8, plt 271, mcv 75.8   Assessment/Plan  BPH- will continue flomax, symptoms under control, no changes made  Constipation- improved with miralax, continue and monitor  Dementia- currently off all medication, monitor. Fall precautions, redirections and group activities encouraged. Continue nutritional supplement. Skin care  Osteoarthritis- continue tramadol prn for pain and ca-vit d supplement. Fall precautions  Anemia- continue iron supplement, check cbc on routine basis if patient agrees

## 2014-01-29 ENCOUNTER — Non-Acute Institutional Stay (SKILLED_NURSING_FACILITY): Payer: Medicare Other | Admitting: Internal Medicine

## 2014-01-29 DIAGNOSIS — R338 Other retention of urine: Secondary | ICD-10-CM

## 2014-01-29 DIAGNOSIS — G309 Alzheimer's disease, unspecified: Secondary | ICD-10-CM

## 2014-01-29 DIAGNOSIS — F0280 Dementia in other diseases classified elsewhere without behavioral disturbance: Secondary | ICD-10-CM

## 2014-01-29 DIAGNOSIS — K5900 Constipation, unspecified: Secondary | ICD-10-CM

## 2014-01-29 DIAGNOSIS — N401 Enlarged prostate with lower urinary tract symptoms: Secondary | ICD-10-CM

## 2014-01-29 DIAGNOSIS — R339 Retention of urine, unspecified: Secondary | ICD-10-CM

## 2014-01-29 DIAGNOSIS — D649 Anemia, unspecified: Secondary | ICD-10-CM

## 2014-01-29 DIAGNOSIS — M1990 Unspecified osteoarthritis, unspecified site: Secondary | ICD-10-CM

## 2014-01-29 NOTE — Progress Notes (Signed)
Patient ID: Kevin [name], male   DOB: 23-Jan-1925, 80 y.o.   MRN: [id number]   Kevin [name] living Kevin [name]  CC- routine follow up visit  HPI:   80 y/o male patient is a long term care resident. He is alert and oriented to person only. He is in no distress. He refuses to take his medications mostly. He has dementia. He is able to feed himself. He refuses to talk and asks me to leave as he needs no exam  Review of Systems: from staff Constitutional: Negative for fever, chills and weight loss.   HENT: Negative for congestion.    Eyes: Negative for blurred vision.   Respiratory: Negative for shortness of breath.    Cardiovascular: Negative for chest pain, palpitations and leg swelling.   Gastrointestinal: Negative for heartburn, nausea, vomiting, abdominal pain and constipation.   Genitourinary: Negative for dysuria.   Musculoskeletal: Negative for falls.   Skin: Negative for rash.   Neurological: Negative for dizziness and weakness.   Psychiatric/Behavioral: has memory loss  Allergies  Allergen Reactions  . Morphine Other (See Comments)    Per MAR  . Wellbutrin [Bupropion]    Past Medical History  Diagnosis Date  . UTI (lower urinary tract infection)   . Cardiomyopathy   . Insomnia   . Obesity   . AICD (automatic cardioverter/defibrillator) present   . Hypertension   . Vitamin D deficiency   . Sleep apnea     no cpap  . Dementia   . Depressive disorder    Medication reviewed. See [facility] Current Outpatient Prescriptions on File Prior to Visit  Medication Sig Dispense Refill  . Calcium Carb-Cholecalciferol (CALCIUM-VITAMIN D) 500-400 MG-UNIT TABS Take 1 tablet by mouth daily.      . iron polysaccharides (NIFEREX) 150 MG capsule Take 150 mg by mouth daily.      Marland Kitchen LORazepam (ATIVAN) 0.5 MG tablet Take 0.5 mg by mouth every 6 (six) hours as needed for anxiety. Gel every 6 hours as needed      . Melatonin 3 MG CAPS Take 1 capsule by mouth at bedtime as needed (difficulty sleeping).       . polyethylene glycol (MIRALAX / GLYCOLAX) packet Take 17 g by mouth daily.      Marland Kitchen Propylene Glycol (SYSTANE BALANCE) 0.6 % SOLN Place 1 drop into both eyes 2 (two) times daily.      . Tamsulosin HCl (FLOMAX) 0.4 MG CAPS Take 1 capsule (0.4 mg total) by mouth daily after supper.  30 capsule  0  . traMADol (ULTRAM) 50 MG tablet Take 50 mg by mouth every 6 (six) hours as needed. For pain      . White Petrolatum-Mineral Oil (SYSTANE NIGHTTIME OP) Place 0.25 strips into both eyes at bedtime. Apply 1/4 " strip to both eyes at bedtime       No current facility-administered medications on file prior to visit.    Physical exam BP 123/60  Pulse 84  Temp(Src) 98 F (36.7 C)  Resp 16  Ht 5\' 6"  (1.676 m)  Wt 145 lb (65.772 kg)  BMI 23.41 kg/m2  SpO2 97%  Constitutional: No distress. Elderly male patient   HENT:   Head: Normocephalic and atraumatic.   Mouth/Throat: Oropharynx is clear and moist.   Eyes: Conjunctivae are normal. Pupils are equal, round, and reactive to light.   Neck: refused exam Cardiovascular: refused exam Pulmonary/Chest: refused exam Abdominal: refused exam Musculoskeletal: able to move all 4 Neurological: He is alert. Oriented to  person only   Skin: Skin is warm and dry.   Labs reviewed:  03/30/13 na 140, k 3.9, bun 23, cr 1.06, glu 107, wbc 6, hb 11.3, hct 35.8, plt 271, mcv 75.8  Assessment/Plan  Osteoarthritis- continue tramadol prn for pain and ca-vit d supplement. Fall precautions  BPH- will continue flomax, symptoms under control, no changes made  Dementia- currently off all medication, monitor. bp stable. Weight stable. Fall precautions, skin care.  Anemia- continue iron supplement, check cbc next lab draw  Constipation- continue miralax

## 2014-01-29 NOTE — Progress Notes (Signed)
Patient ID: Kevin [name], male   DOB: 02-Jan-1925, 80 y.o.   MRN: [id number]   Lab from 9/14 reviewed. Hb 9.6. Continue iron supplement

## 2014-04-30 ENCOUNTER — Encounter: Payer: Self-pay | Admitting: Internal Medicine

## 2014-04-30 ENCOUNTER — Non-Acute Institutional Stay (SKILLED_NURSING_FACILITY): Payer: Medicare Other | Admitting: Internal Medicine

## 2014-04-30 DIAGNOSIS — R339 Retention of urine, unspecified: Secondary | ICD-10-CM

## 2014-04-30 DIAGNOSIS — F3289 Other specified depressive episodes: Secondary | ICD-10-CM

## 2014-04-30 DIAGNOSIS — F329 Major depressive disorder, single episode, unspecified: Secondary | ICD-10-CM

## 2014-04-30 DIAGNOSIS — F0393 Unspecified dementia, unspecified severity, with mood disturbance: Secondary | ICD-10-CM | POA: Insufficient documentation

## 2014-04-30 DIAGNOSIS — N401 Enlarged prostate with lower urinary tract symptoms: Secondary | ICD-10-CM

## 2014-04-30 DIAGNOSIS — K5900 Constipation, unspecified: Secondary | ICD-10-CM

## 2014-04-30 DIAGNOSIS — G309 Alzheimer's disease, unspecified: Principal | ICD-10-CM

## 2014-04-30 DIAGNOSIS — R338 Other retention of urine: Secondary | ICD-10-CM

## 2014-04-30 DIAGNOSIS — F0280 Dementia in other diseases classified elsewhere without behavioral disturbance: Secondary | ICD-10-CM | POA: Insufficient documentation

## 2014-04-30 NOTE — Progress Notes (Signed)
Patient ID: Kevin [name], male   DOB: [birth year], 80 y.o.   MRN: [id number]    Kevin [name] living Kevin [name]  CC- routine follow up visit  HPI:   80 y/o male patient is a long term care resident. He has dementia and depression BPH, constipation, iron deficiency anemia and OA among others. He is alert and oriented to person only. He is in no distress. He refuses to take his medications mostly. He is able to feed himself.   Review of Systems: from staff Constitutional: Negative for fever, chills and weight loss.   HENT: Negative for congestion.    Eyes: Negative for blurred vision.   Respiratory: Negative for shortness of breath.    Cardiovascular: Negative for chest pain, palpitations and leg swelling.   Gastrointestinal: Negative for heartburn, nausea, vomiting, abdominal pain and constipation.   Genitourinary: Negative for dysuria.   Musculoskeletal: Negative for falls.   Skin: Negative for rash.   Neurological: Negative for dizziness and weakness.   Psychiatric/Behavioral: has memory loss  Past Medical History  Diagnosis Date  . UTI (lower urinary tract infection)   . Cardiomyopathy   . Insomnia   . Obesity   . AICD (automatic cardioverter/defibrillator) present   . Hypertension   . Vitamin D deficiency   . Sleep apnea     no cpap  . Dementia   . Depressive disorder    Current Outpatient Prescriptions on File Prior to Visit  Medication Sig Dispense Refill  . Calcium Carb-Cholecalciferol (CALCIUM-VITAMIN D) 500-400 MG-UNIT TABS Take 1 tablet by mouth daily.      . iron polysaccharides (NIFEREX) 150 MG capsule Take 150 mg by mouth daily.      Marland Kitchen LORazepam (ATIVAN) 0.5 MG tablet Take 0.5 mg by mouth every 6 (six) hours as needed for anxiety. Gel every 6 hours as needed      . Melatonin 3 MG CAPS Take 1 capsule by mouth at bedtime as needed (difficulty sleeping).      . polyethylene glycol (MIRALAX / GLYCOLAX) packet Take 17 g by mouth daily.      Marland Kitchen Propylene Glycol (SYSTANE  BALANCE) 0.6 % SOLN Place 1 drop into both eyes 2 (two) times daily.      . Tamsulosin HCl (FLOMAX) 0.4 MG CAPS Take 1 capsule (0.4 mg total) by mouth daily after supper.  30 capsule  0  . traMADol (ULTRAM) 50 MG tablet Take 50 mg by mouth every 6 (six) hours as needed. For pain      . White Petrolatum-Mineral Oil (SYSTANE NIGHTTIME OP) Place 0.25 strips into both eyes at bedtime. Apply 1/4 " strip to both eyes at bedtime       No current facility-administered medications on file prior to visit.   Physical exam BP 106/64  Pulse 78  Temp(Src) 97.3 F (36.3 C)  Resp 18  Ht 5\' 6"  (1.676 m)  Wt 142 lb (64.411 kg)  BMI 22.93 kg/m2  SpO2 96%  Constitutional: No distress. Elderly male patient   HENT:   Head: Normocephalic and atraumatic.   Mouth/Throat: Oropharynx is clear and moist.   Eyes: Conjunctivae are normal. Pupils are equal, round, and reactive to light.   Neck: refused exam Cardiovascular: normal s1,s2, rrr Pulmonary/Chest: CTAB Abdominal: soft, non tender, bowel sounds present Musculoskeletal: able to move all 4 Neurological: He is alert. Oriented to person only   Skin: Skin is warm and dry.   Labs reviewed:  03/30/13 na 140, k 3.9, bun 23, cr 1.06,  glu 107, wbc 6, hb 11.3, hct 35.8, plt 271, mcv 75.8  Assessment/Plan  alzhimer's dementia- currently off all medication, monitor. bp stable. Weight stable. Fall precautions, skin care.  Depression- in setting of dementia. Patient and family refuses treatment at this point. Will start him on antidepressant if family agrees with this  BPH- will continue flomax, symptoms under control, no changes made  Constipation- continue miralax

## 2014-06-24 ENCOUNTER — Encounter: Payer: Self-pay | Admitting: Internal Medicine

## 2014-06-24 ENCOUNTER — Non-Acute Institutional Stay (SKILLED_NURSING_FACILITY): Payer: Medicare Other | Admitting: Internal Medicine

## 2014-06-24 DIAGNOSIS — F0280 Dementia in other diseases classified elsewhere without behavioral disturbance: Secondary | ICD-10-CM

## 2014-06-24 DIAGNOSIS — R338 Other retention of urine: Secondary | ICD-10-CM

## 2014-06-24 DIAGNOSIS — D508 Other iron deficiency anemias: Secondary | ICD-10-CM

## 2014-06-24 DIAGNOSIS — G309 Alzheimer's disease, unspecified: Secondary | ICD-10-CM

## 2014-06-24 DIAGNOSIS — N401 Enlarged prostate with lower urinary tract symptoms: Secondary | ICD-10-CM

## 2014-06-24 DIAGNOSIS — R339 Retention of urine, unspecified: Secondary | ICD-10-CM

## 2014-06-24 DIAGNOSIS — N138 Other obstructive and reflux uropathy: Secondary | ICD-10-CM

## 2014-06-24 DIAGNOSIS — R6889 Other general symptoms and signs: Secondary | ICD-10-CM

## 2014-06-24 NOTE — Progress Notes (Signed)
Patient ID: Kevin [name], male   DOB: 19-Jul-1925, 80 y.o.   MRN: [id number]    Allergies  Allergen Reactions  . Morphine Other (See Comments)    Per MAR  . Wellbutrin [Bupropion]    Facility: [facility]  CC- routine follow up visit  HPI:   80 y/o male patient with history of dementia, constipation, iron deficiency anemia, BPH, depression and OA is seen for routine visit. He mentions that he has been feeling cold a lot lately. Denies fever. He is alert and oriented to person only. He is in no distress. He is able to feed himself. Has been taking his medication lately. As per staff, he refused breakfast this am. No other concerns  Review of Systems: from staff Constitutional: Negative for fever, chills and weight loss.   HENT: Negative for congestion.    Eyes: Negative for blurred vision.   Respiratory: Negative for shortness of breath.    Cardiovascular: Negative for chest pain, palpitations and leg swelling.   Gastrointestinal: Negative for heartburn, nausea, vomiting, abdominal pain and constipation.   Genitourinary: Negative for dysuria.   Musculoskeletal: Negative for falls.   Skin: Negative for rash.   Neurological: Negative for dizziness and weakness.   Psychiatric/Behavioral: has memory loss    Past Medical History  Diagnosis Date  . UTI (lower urinary tract infection)   . Cardiomyopathy   . Insomnia   . Obesity   . AICD (automatic cardioverter/defibrillator) present   . Hypertension   . Vitamin D deficiency   . Sleep apnea     no cpap  . Dementia   . Depressive disorder    Current Outpatient Prescriptions on File Prior to Visit  Medication Sig Dispense Refill  . Calcium Carb-Cholecalciferol (CALCIUM-VITAMIN D) 500-400 MG-UNIT TABS Take 1 tablet by mouth daily.      . iron polysaccharides (NIFEREX) 150 MG capsule Take 150 mg by mouth daily.      . polyethylene glycol (MIRALAX / GLYCOLAX) packet Take 17 g by mouth daily.      . Tamsulosin HCl  (FLOMAX) 0.4 MG CAPS Take 1 capsule (0.4 mg total) by mouth daily after supper.  30 capsule  0  . traMADol (ULTRAM) 50 MG tablet Take 50 mg by mouth every 6 (six) hours as needed. For pain       No current facility-administered medications on file prior to visit.   Physical exam BP 135/78  Pulse 88  Temp(Src) 97.5 F (36.4 C)  Resp 18  Ht 5\' 6"  (1.676 m)  Wt 141 lb (63.957 kg)  BMI 22.77 kg/m2  Constitutional: No distress. Elderly male patient with thin built HENT:   Head: Normocephalic and atraumatic.   Mouth/Throat: Oropharynx is clear and moist.   Eyes: Conjunctivae are normal. Pupils are equal, round, and reactive to light.   Neck: no thyromegaly Cardiovascular: normal s1,s2, rrr Pulmonary/Chest: CTAB Abdominal: soft, non tender, bowel sounds present Musculoskeletal: able to move all 4 Neurological: He is alert. Oriented to person only   Skin: Skin is warm and dry.   Labs reviewed:  03/30/13 na 140, k 3.9, bun 23, cr 1.06, glu 107, wbc 6, hb 11.3, hct 35.8, plt 271, mcv 75.8 09/18/13 wbc 3.0, hb 9.6, hct 29.8, mcv 74.9, plt 215, na 141, k 4.1, cl 1.09, glu 103,  Bun 19, cr 0.95, ca 9.4 02/01/14 wbc 4.2, hb 10.1, hct 39.2, mcv 78.3, plt 219   Assessment/Plan  Iron deficiency anemia Worsened hb/hct on review.  This could be making him feel cold as well. Increase iron to 300 mg daily. Recheck h&h in 1 month  Cold intolerance New symptom reported. Will try to replete his iron store and check cbc in a month. Also check thyroid in a month if no clinical or symptomatic improvement noted  alzhimer's dementia currently off all medication, monitor. bp stable. Weight stable. Fall precautions, skin care.  BPH will continue flomax and monitor

## 2014-07-01 DIAGNOSIS — R6889 Other general symptoms and signs: Secondary | ICD-10-CM | POA: Insufficient documentation

## 2014-07-26 LAB — BASIC METABOLIC PANEL
BUN: 18 mg/dL (ref 4–21)
CREATININE: 1 mg/dL (ref 0.6–1.3)
Glucose: 91 mg/dL
Potassium: 4.1 mmol/L (ref 3.4–5.3)
SODIUM: 142 mmol/L (ref 137–147)

## 2014-07-26 LAB — HEPATIC FUNCTION PANEL
ALK PHOS: 65 U/L (ref 25–125)
ALT: 10 U/L (ref 10–40)
AST: 18 U/L (ref 14–40)

## 2014-07-26 LAB — TSH: TSH: 2.89 u[IU]/mL (ref 0.41–5.90)

## 2014-07-26 LAB — CBC AND DIFFERENTIAL
HEMATOCRIT: 33 % — AB (ref 41–53)
Hemoglobin: 10.6 g/dL — AB (ref 13.5–17.5)
PLATELETS: 179 10*3/uL (ref 150–399)
WBC: 4.2 10^3/mL

## 2014-09-23 ENCOUNTER — Encounter: Payer: Self-pay | Admitting: Internal Medicine

## 2014-10-14 ENCOUNTER — Encounter: Payer: Self-pay | Admitting: Internal Medicine

## 2014-10-14 ENCOUNTER — Non-Acute Institutional Stay (SKILLED_NURSING_FACILITY): Payer: Medicare Other | Admitting: Internal Medicine

## 2014-10-14 DIAGNOSIS — G309 Alzheimer's disease, unspecified: Secondary | ICD-10-CM

## 2014-10-14 DIAGNOSIS — D508 Other iron deficiency anemias: Secondary | ICD-10-CM

## 2014-10-14 DIAGNOSIS — N400 Enlarged prostate without lower urinary tract symptoms: Secondary | ICD-10-CM

## 2014-10-14 DIAGNOSIS — F0280 Dementia in other diseases classified elsewhere without behavioral disturbance: Secondary | ICD-10-CM

## 2014-10-14 NOTE — Progress Notes (Signed)
Patient ID: Kevin [name], male   DOB: 10-15-1925, 80 y.o.   MRN: [id number]   Place of Service: [facility]  Allergies  Allergen Reactions  . Morphine Other (See Comments)    Per MAR  . Wellbutrin [Bupropion]     Code Status: Full Code  Goals of Care: Longevity/Long term care  Chief Complaint  Patient presents with  . Medical Management of Chronic Issues    AD, BPH, iron deficiency anemia, constipation    HPI 80 y.o. male with PMH of Alzheimer's disease, iron deficiency anemia, and constipation among others is being seen for a routine visit. No complaints verbalized from patient. No falls, skin issues, or change in weight reported. No change in bowel habit or appetite. No concern from nursing staff.   Review of Systems Constitutional: Negative for fever, chills, and fatigue. HENT: Negative for congestion, and sore throat Eyes: Negative for eye pain, eye discharge, and visual disturbance  Cardiovascular: Negative for chest pain, palpitations, and leg swelling Respiratory: Negative cough, shortness of breath, and wheezing.  Gastrointestinal: Negative for nausea and vomiting. Negative for abdominal pain, diarrhea and constipation.  Genitourinary: Negative for  dysuria, frequency, urgency, and hematuria Endocrine: Negative for polydipsia, polyphagia, and polyuria Musculoskeletal: Negative for [name] pain, joint pain, and joint swelling  Neurological: Negative for dizziness, headache, and tremors.  Skin: Negative for rash and wound.   Psychiatric: Negative for nervous/anxious, agitation, depression.   Past Medical History  Diagnosis Date  . UTI (lower urinary tract infection)   . Cardiomyopathy   . Insomnia   . Obesity   . AICD (automatic cardioverter/defibrillator) present   . Hypertension   . Vitamin D deficiency   . Sleep apnea     no cpap  . Dementia   . Depressive disorder     Past Surgical History  Procedure Laterality Date  . Total knee  arthroplasty      left  . [name] surgery    . Cardiac defibrillator placement    . Esophagogastroduodenoscopy  06/28/2012    Procedure: ESOPHAGOGASTRODUODENOSCOPY (EGD);  Surgeon: Petra Kuba, MD;  Location: [facility] ENDOSCOPY;  Service: Endoscopy;  Laterality: N/A;    History   Social History  . Marital Status: Married    Spouse Name: N/A    Number of Children: N/A  . Years of Education: N/A   Occupational History  . Not on file.   Social History Main Topics  . Smoking status: Never Smoker   . Smokeless tobacco: Not on file  . Alcohol Use: No  . Drug Use: No  . Sexual Activity:    Other Topics Concern  . Not on file   Social History Narrative  . No narrative on file      Medication List       This list is accurate as of: 10/14/14 11:35 AM.  Always use your most recent med list.               Calcium-Vitamin D 500-400 MG-UNIT Tabs  Take 1 tablet by mouth daily.     iron polysaccharides 150 MG capsule  Commonly known as:  NIFEREX  Take 150 mg by mouth daily.     polyethylene glycol packet  Commonly known as:  MIRALAX / GLYCOLAX  Take 17 g by mouth daily.     senna 8.6 MG tablet  Commonly known as:  SENOKOT  Take 2 tablets by mouth daily.     tamsulosin 0.4 MG Caps capsule  Commonly  known as:  FLOMAX  Take 1 capsule (0.4 mg total) by mouth daily after supper.     traMADol 50 MG tablet  Commonly known as:  ULTRAM  Take 50 mg by mouth every 6 (six) hours as needed. For pain        Physical Exam Filed Vitals:   10/14/14 1133  BP: 140/85  Pulse: 77  Temp: 97 F (36.1 C)  Resp: 20   Constitutional: WDWN elderly male in no acute distress.  HEENT: Normocephalic and atraumatic. PERRL. EOM intact. No icterus. Thick nasal discharge noted in R nare. Oral mucosa moist. Posterior pharynx clear of any exudate or lesions.  Neck: Supple and nontender. No lymphadenopathy, masses, or thyromegaly. No JVD or carotid bruits. Cardiac: Normal S1, S2. RRR without  appreciable murmurs, rubs, or gallops. Intact pulses intact. No dependent edema.  Lungs: No respiratory distress. Breath sounds clear bilaterally without rales, rhonchi, or wheezes. Abdomen: Audible bowel sounds in all quadrants. Soft, nontender, nondistended. No palpable mass.  Musculoskeletal:  No joint erythema or tenderness. Seen in bed Skin: Warm and dry. No rash noted. No erythema.  Neurological: Alert and oriented to person, place. No focal deficits.  Psychiatric: Judgment and insight adequate. Appropriate mood and affect.   Labs Reviewed CBC Latest Ref Rng 07/26/2014 07/01/2012 06/30/2012  WBC - 4.2 4.7 5.9  Hemoglobin 13.5 - 17.5 g/dL 10.6(A) 8.0(L) 7.5(L)  Hematocrit 41 - 53 % 33(A) 24.0(L) 23.2(L)  Platelets 150 - 399 K/L 179 330 322   CMP     Component Value Date/Time   NA 142 07/26/2014   NA 137 07/01/2012 0410   K 4.1 07/26/2014   CL 105 07/01/2012 0410   CO2 24 07/01/2012 0410   GLUCOSE 94 07/01/2012 0410   BUN 18 07/26/2014   BUN 11 07/01/2012 0410   CREATININE 1.0 07/26/2014   CREATININE 0.95 07/01/2012 0410   CALCIUM 9.2 07/01/2012 0410   PROT 7.8 06/28/2012 0644   ALBUMIN 3.0* 06/28/2012 0644   AST 18 07/26/2014   ALT 10 07/26/2014   ALKPHOS 65 07/26/2014   BILITOT 0.1* 06/28/2012 0644   GFRNONAA 74* 07/01/2012 0410   GFRAA 86* 07/01/2012 0410   Lab Results  Component Value Date   TSH 2.89 07/26/2014    Assessment & Plan 1. Alzheimer's dementia Continues to progress. NO change in behavior reported from staff.  Currently not on any medication for memory. Continue to monitor for change in behavior. Continue fall risk and pressure ulcer precautions  2. BPH (benign prostatic hypertrophy) Stable. Continue flomax 0.4mg  daily and monitor  3. Other iron deficiency anemias Hbg and hct stable. Continue iron supplement and monitor.  Family/Staff Communication Plan of care discuss with resident and professional staff members. Resident and professional staff members verbalize understanding and agree  with plan of care. No additional questions or concerns reported.    Kevin BackKim Nguyen, MSN, AGNP-C [facility] [address] [place], [place] [address] [phone] [8am-5pm] After hours: [phone]  I have personally reviewed this note and agree with the care plan  [facility] Center[facility]HIMA Sherell Christoffel, MD  [facility] [phone] (Monday-Friday 8 am - 5 pm) [phone] (afterhours)

## 2014-10-26 ENCOUNTER — Encounter: Payer: Self-pay | Admitting: *Deleted

## 2014-11-11 ENCOUNTER — Encounter (HOSPITAL_COMMUNITY): Payer: Self-pay | Admitting: Emergency Medicine

## 2014-11-11 ENCOUNTER — Emergency Department (HOSPITAL_COMMUNITY): Payer: Medicare Other

## 2014-11-11 ENCOUNTER — Inpatient Hospital Stay (HOSPITAL_COMMUNITY): Payer: Medicare Other

## 2014-11-11 ENCOUNTER — Inpatient Hospital Stay (HOSPITAL_COMMUNITY)
Admission: EM | Admit: 2014-11-11 | Discharge: 2014-11-16 | DRG: 291 | Disposition: A | Discharge status: Skilled Nursing Facility | Payer: Medicare Other | Attending: Internal Medicine | Admitting: Internal Medicine

## 2014-11-11 DIAGNOSIS — I4891 Unspecified atrial fibrillation: Secondary | ICD-10-CM | POA: Diagnosis present

## 2014-11-11 DIAGNOSIS — J9811 Atelectasis: Secondary | ICD-10-CM | POA: Diagnosis present

## 2014-11-11 DIAGNOSIS — I509 Heart failure, unspecified: Secondary | ICD-10-CM

## 2014-11-11 DIAGNOSIS — R7989 Other specified abnormal findings of blood chemistry: Secondary | ICD-10-CM

## 2014-11-11 DIAGNOSIS — R338 Other retention of urine: Secondary | ICD-10-CM | POA: Diagnosis present

## 2014-11-11 DIAGNOSIS — J9601 Acute respiratory failure with hypoxia: Secondary | ICD-10-CM | POA: Diagnosis present

## 2014-11-11 DIAGNOSIS — Z95810 Presence of automatic (implantable) cardiac defibrillator: Secondary | ICD-10-CM

## 2014-11-11 DIAGNOSIS — N400 Enlarged prostate without lower urinary tract symptoms: Secondary | ICD-10-CM | POA: Diagnosis present

## 2014-11-11 DIAGNOSIS — K5909 Other constipation: Secondary | ICD-10-CM

## 2014-11-11 DIAGNOSIS — J810 Acute pulmonary edema: Secondary | ICD-10-CM

## 2014-11-11 DIAGNOSIS — G4730 Sleep apnea, unspecified: Secondary | ICD-10-CM | POA: Diagnosis present

## 2014-11-11 DIAGNOSIS — E559 Vitamin D deficiency, unspecified: Secondary | ICD-10-CM | POA: Diagnosis present

## 2014-11-11 DIAGNOSIS — I428 Other cardiomyopathies: Secondary | ICD-10-CM | POA: Diagnosis present

## 2014-11-11 DIAGNOSIS — I959 Hypotension, unspecified: Secondary | ICD-10-CM | POA: Diagnosis present

## 2014-11-11 DIAGNOSIS — I5031 Acute diastolic (congestive) heart failure: Secondary | ICD-10-CM

## 2014-11-11 DIAGNOSIS — Z66 Do not resuscitate: Secondary | ICD-10-CM | POA: Diagnosis present

## 2014-11-11 DIAGNOSIS — R0602 Shortness of breath: Secondary | ICD-10-CM | POA: Diagnosis present

## 2014-11-11 DIAGNOSIS — I5041 Acute combined systolic (congestive) and diastolic (congestive) heart failure: Principal | ICD-10-CM | POA: Diagnosis present

## 2014-11-11 DIAGNOSIS — N401 Enlarged prostate with lower urinary tract symptoms: Secondary | ICD-10-CM | POA: Diagnosis present

## 2014-11-11 DIAGNOSIS — K5900 Constipation, unspecified: Secondary | ICD-10-CM | POA: Diagnosis present

## 2014-11-11 DIAGNOSIS — F0280 Dementia in other diseases classified elsewhere without behavioral disturbance: Secondary | ICD-10-CM | POA: Diagnosis present

## 2014-11-11 DIAGNOSIS — Z79899 Other long term (current) drug therapy: Secondary | ICD-10-CM | POA: Diagnosis not present

## 2014-11-11 DIAGNOSIS — Z96652 Presence of left artificial knee joint: Secondary | ICD-10-CM | POA: Diagnosis present

## 2014-11-11 DIAGNOSIS — G309 Alzheimer's disease, unspecified: Secondary | ICD-10-CM | POA: Diagnosis present

## 2014-11-11 DIAGNOSIS — I5021 Acute systolic (congestive) heart failure: Secondary | ICD-10-CM

## 2014-11-11 DIAGNOSIS — I10 Essential (primary) hypertension: Secondary | ICD-10-CM | POA: Diagnosis present

## 2014-11-11 DIAGNOSIS — R0600 Dyspnea, unspecified: Secondary | ICD-10-CM

## 2014-11-11 LAB — BASIC METABOLIC PANEL
Anion gap: 14 (ref 5–15)
BUN: 20 mg/dL (ref 6–23)
CALCIUM: 9.6 mg/dL (ref 8.4–10.5)
CO2: 22 mEq/L (ref 19–32)
Chloride: 107 mEq/L (ref 96–112)
Creatinine, Ser: 0.93 mg/dL (ref 0.50–1.35)
GFR calc Af Amer: 86 mL/min — ABNORMAL LOW (ref >90)
GFR, EST NON AFRICAN AMERICAN: 74 mL/min — AB (ref >90)
Glucose, Bld: 171 mg/dL — ABNORMAL HIGH (ref 70–99)
Potassium: 5.2 mEq/L (ref 3.7–5.3)
Sodium: 143 mEq/L (ref 137–147)

## 2014-11-11 LAB — CBC
HCT: 34.2 % — ABNORMAL LOW (ref 39.0–52.0)
Hemoglobin: 10.7 g/dL — ABNORMAL LOW (ref 13.0–17.0)
MCH: 25.7 pg — AB (ref 26.0–34.0)
MCHC: 31.3 g/dL (ref 30.0–36.0)
MCV: 82.2 fL (ref 78.0–100.0)
PLATELETS: 249 10*3/uL (ref 150–400)
RBC: 4.16 MIL/uL — ABNORMAL LOW (ref 4.22–5.81)
RDW: 18.1 % — AB (ref 11.5–15.5)
WBC: 6.3 10*3/uL (ref 4.0–10.5)

## 2014-11-11 LAB — APTT: aPTT: 35 seconds (ref 24–37)

## 2014-11-11 LAB — I-STAT TROPONIN, ED: Troponin i, poc: 0.07 ng/mL (ref 0.00–0.08)

## 2014-11-11 LAB — PROTIME-INR
INR: 1.3 (ref 0.00–1.49)
PROTHROMBIN TIME: 16.3 s — AB (ref 11.6–15.2)

## 2014-11-11 LAB — TROPONIN I: Troponin I: <0.3 ng/mL (ref <0.30)

## 2014-11-11 LAB — MRSA PCR SCREENING: MRSA by PCR: POSITIVE — AB

## 2014-11-11 LAB — PRO B NATRIURETIC PEPTIDE
Pro B Natriuretic peptide (BNP): 15640 pg/mL — ABNORMAL HIGH (ref 0–450)
Pro B Natriuretic peptide (BNP): 15842 pg/mL — ABNORMAL HIGH (ref 0–450)

## 2014-11-11 MED ORDER — IPRATROPIUM-ALBUTEROL 0.5-2.5 (3) MG/3ML IN SOLN
3.0000 mL | Freq: Once | RESPIRATORY_TRACT | Status: AC
  Administered 2014-11-11: 3 mL via RESPIRATORY_TRACT
  Filled 2014-11-11: qty 3

## 2014-11-11 MED ORDER — FUROSEMIDE 10 MG/ML IJ SOLN: 20.0000 mg | Freq: Every day | INTRAMUSCULAR | Status: DC

## 2014-11-11 MED ORDER — SODIUM CHLORIDE 0.9 % IV SOLN: 250.0000 mL | INTRAVENOUS | Status: DC | PRN

## 2014-11-11 MED ORDER — ONDANSETRON HCL 4 MG/2ML IJ SOLN: 4.0000 mg | Freq: Four times a day (QID) | INTRAMUSCULAR | Status: DC | PRN

## 2014-11-11 MED ORDER — SODIUM CHLORIDE 0.9 % IJ SOLN
3.0000 mL | Freq: Two times a day (BID) | INTRAMUSCULAR | Status: DC
  Administered 2014-11-11 – 2014-11-16 (×5): 3 mL via INTRAVENOUS

## 2014-11-11 MED ORDER — GUAIFENESIN 100 MG/5ML PO SOLN
10.0000 mL | Freq: Four times a day (QID) | ORAL | Status: DC | PRN
  Administered 2014-11-13 – 2014-11-14 (×2): 200 mg via ORAL
  Filled 2014-11-11 (×2): qty 10

## 2014-11-11 MED ORDER — SENNA 8.6 MG PO TABS
2.0000 | ORAL_TABLET | Freq: Every day | ORAL | Status: DC
  Administered 2014-11-12 – 2014-11-16 (×5): 17.2 mg via ORAL
  Filled 2014-11-11 (×5): qty 2

## 2014-11-11 MED ORDER — HEPARIN BOLUS VIA INFUSION
1700.0000 [IU] | Freq: Once | INTRAVENOUS | Status: AC
  Administered 2014-11-11: 1700 [IU] via INTRAVENOUS
  Filled 2014-11-11: qty 1700

## 2014-11-11 MED ORDER — SODIUM CHLORIDE 0.9 % IJ SOLN: 3.0000 mL | INTRAMUSCULAR | Status: DC | PRN

## 2014-11-11 MED ORDER — MAGNESIUM HYDROXIDE 400 MG/5ML PO SUSP
30.0000 mL | Freq: Every day | ORAL | Status: DC | PRN
  Administered 2014-11-15: 30 mL via ORAL
  Filled 2014-11-11: qty 30

## 2014-11-11 MED ORDER — IPRATROPIUM BROMIDE 0.02 % IN SOLN
0.5000 mg | Freq: Once | RESPIRATORY_TRACT | Status: AC
  Administered 2014-11-11: 0.5 mg via RESPIRATORY_TRACT
  Filled 2014-11-11: qty 2.5

## 2014-11-11 MED ORDER — POLYSACCHARIDE IRON COMPLEX 150 MG PO CAPS
150.0000 mg | ORAL_CAPSULE | Freq: Every day | ORAL | Status: DC
  Administered 2014-11-12 – 2014-11-16 (×5): 150 mg via ORAL
  Filled 2014-11-11 (×6): qty 1

## 2014-11-11 MED ORDER — METOPROLOL TARTRATE 1 MG/ML IV SOLN: 2.5000 mg | INTRAVENOUS | Status: DC | PRN

## 2014-11-11 MED ORDER — ENOXAPARIN SODIUM 30 MG/0.3ML ~~LOC~~ SOLN
30.0000 mg | SUBCUTANEOUS | Status: DC
  Filled 2014-11-11: qty 0.3

## 2014-11-11 MED ORDER — TAMSULOSIN HCL 0.4 MG PO CAPS
0.4000 mg | ORAL_CAPSULE | Freq: Every day | ORAL | Status: DC
  Administered 2014-11-12 – 2014-11-15 (×4): 0.4 mg via ORAL
  Filled 2014-11-11 (×7): qty 1

## 2014-11-11 MED ORDER — ACETAMINOPHEN 325 MG PO TABS: 650.0000 mg | ORAL_TABLET | ORAL | Status: DC | PRN

## 2014-11-11 MED ORDER — CALCIUM CARBONATE-VITAMIN D 500-200 MG-UNIT PO TABS
1.0000 | ORAL_TABLET | Freq: Every day | ORAL | Status: DC
  Administered 2014-11-12 – 2014-11-16 (×5): 1 via ORAL
  Filled 2014-11-11 (×6): qty 1

## 2014-11-11 MED ORDER — FUROSEMIDE 10 MG/ML IJ SOLN
20.0000 mg | Freq: Once | INTRAMUSCULAR | Status: AC
  Administered 2014-11-11: 20 mg via INTRAVENOUS
  Filled 2014-11-11: qty 4

## 2014-11-11 MED ORDER — LEVALBUTEROL HCL 0.63 MG/3ML IN NEBU
0.6300 mg | INHALATION_SOLUTION | Freq: Once | RESPIRATORY_TRACT | Status: AC
  Administered 2014-11-11: 0.63 mg via RESPIRATORY_TRACT
  Filled 2014-11-11: qty 3

## 2014-11-11 MED ORDER — POLYETHYLENE GLYCOL 3350 17 G PO PACK
17.0000 g | PACK | Freq: Every day | ORAL | Status: DC
  Administered 2014-11-12 – 2014-11-16 (×5): 17 g via ORAL
  Filled 2014-11-11 (×6): qty 1

## 2014-11-11 MED ORDER — IPRATROPIUM-ALBUTEROL 0.5-2.5 (3) MG/3ML IN SOLN
3.0000 mL | RESPIRATORY_TRACT | Status: DC | PRN
  Administered 2014-11-12 – 2014-11-13 (×3): 3 mL via RESPIRATORY_TRACT
  Filled 2014-11-11 (×4): qty 3

## 2014-11-11 MED ORDER — CALCIUM-VITAMIN D 500-400 MG-UNIT PO TABS: 1.0000 | ORAL_TABLET | Freq: Every day | ORAL | Status: DC

## 2014-11-11 MED ORDER — HEPARIN (PORCINE) IN NACL 100-0.45 UNIT/ML-% IJ SOLN
1200.0000 [IU]/h | INTRAMUSCULAR | Status: DC
  Administered 2014-11-11: 1200 [IU]/h via INTRAVENOUS
  Filled 2014-11-11: qty 250

## 2014-11-11 NOTE — Plan of Care (Signed)
Problem: Phase I Progression Outcomes Goal: Pain controlled with appropriate interventions Outcome: Completed/Met Date Met:  11/11/14 Goal: OOB as tolerated unless otherwise ordered Outcome: Adequate for Discharge Goal: Initial discharge plan identified Outcome: Progressing Goal: Voiding-avoid urinary catheter unless indicated Outcome: Completed/Met Date Met:  11/11/14 Goal: Hemodynamically stable Outcome: Completed/Met Date Met:  11/11/14  Problem: Phase II Progression Outcomes Goal: Progress activity as tolerated unless otherwise ordered Outcome: Adequate for Discharge Goal: Discharge plan established Outcome: Progressing Goal: Vital signs remain stable Outcome: Completed/Met Date Met:  11/11/14 Goal: IV changed to normal saline lock Outcome: Completed/Met Date Met:  11/11/14 Goal: Obtain order to discontinue catheter if appropriate Outcome: Not Applicable Date Met:  72/25/75  Problem: Phase III Progression Outcomes Goal: Pain controlled on oral analgesia Outcome: Completed/Met Date Met:  11/11/14 Goal: Activity at appropriate level-compared to baseline (UP IN CHAIR FOR HEMODIALYSIS)  Outcome: Adequate for Discharge Goal: Voiding independently Outcome: Completed/Met Date Met:  11/11/14 Goal: IV/normal saline lock discontinued Outcome: Completed/Met Date Met:  11/11/14 Goal: Foley discontinued Outcome: Not Applicable Date Met:  05/10/32 Goal: Discharge plan remains appropriate-arrangements made Outcome: Progressing

## 2014-11-11 NOTE — Consult Note (Addendum)
CARDIOLOGY CONSULT NOTE   Patient ID: Kevin [name] MRN: [id number], DOB/AGE: [birth year]   Admit date: 11/11/2014 Date of Consult: 11/11/2014   Primary Physician: Londell MohPHARR,WALTER DAVIDSON, MD Primary Cardiologist: Formerly Dr. Alanda AmassWeintraub.  Pt. Profile  80 year old gentleman admitted from the nursing home because of shortness of breath.  Problem List  Past Medical History  Diagnosis Date  . UTI (lower urinary tract infection)   . Cardiomyopathy   . Insomnia   . Obesity   . AICD (automatic cardioverter/defibrillator) present   . Hypertension   . Vitamin D deficiency   . Sleep apnea     no cpap  . Dementia   . Depressive disorder     Past Surgical History  Procedure Laterality Date  . Total knee arthroplasty      left  . Back surgery    . Cardiac defibrillator placement    . Esophagogastroduodenoscopy  06/28/2012    Procedure: ESOPHAGOGASTRODUODENOSCOPY (EGD);  Surgeon: Petra KubaMarc E Magod, MD;  [facility];  Service: Endoscopy;  Laterality: N/A;     Allergies  Allergies  Allergen Reactions  . Morphine Other (See Comments)    Per MAR  . Wellbutrin [Bupropion] Other (See Comments)    Per MAR     HPI   This 80 year old gentleman is admitted through the emergency room from the nursing home because of shortness of breath.  He has a complex past medical history.  He has not seen a cardiologist in many years.  He has a former patient of Dr. Alanda AmassWeintraub.  He has a history of normal coronary arteries by cardiac catheterization in 2005.  He has a history of nonischemic cardiomyopathy.  He had a biventricular ICD placed in 2007.  The device reached end of life in 2011 and it was converted from a defibrillator to a pacemaker by Dr. Ladona Ridgelaylor.  The device is a [employer] device.  The new generator was placed on 10/11/2010 and Dr. Ladona Ridgelaylor used the previous ICD leads for the pacemaker.  The patient states that he has had no follow-up of his pacemaker.  Chart review and Epic reveals  several attempts by Dr. Ladona Ridgelaylor to contact the patient unsuccessfully by phone. The patient has dementia.  He is unable to give a clear history.  He states that he has been short of breath.  He denies any fever or chills or purulent sputum.  He denies any hemoptysis.  He is unaware of his atrial fibrillation and he has not been on any anticoagulation at the nursing home.  He has not been on any diuretic therapy at the nursing home nor any AV blocking agents.. His most recent echocardiogram was 01/03/11 which showed an ejection fraction of 60-65%, moderate LVH, and paradoxic septal motion. EKG today shows atrial fibrillation.  A previous EKG in 2013 showed normal sinus rhythm.  His left bundle branch block is old.  Inpatient Medications  . Calcium-Vitamin D  1 tablet Oral Daily  . enoxaparin (LOVENOX) injection  30 mg Subcutaneous Q24H  . [START ON 11/12/2014] furosemide  20 mg Intravenous Daily  . iron polysaccharides  150 mg Oral Q lunch  . polyethylene glycol  17 g Oral Daily  . [START ON 11/12/2014] senna  2 tablet Oral Daily  . sodium chloride  3 mL Intravenous Q12H  . tamsulosin  0.4 mg Oral QPC supper    Family History Family History  Problem Relation Age of Onset  . Stroke Mother   . Lung cancer Brother   .  Brain cancer Brother      Social History History   Social History  . Marital Status: Married    Spouse Name: N/A    Number of Children: N/A  . Years of Education: N/A   Occupational History  . Not on file.   Social History Main Topics  . Smoking status: Never Smoker   . Smokeless tobacco: Never Used  . Alcohol Use: No  . Drug Use: No  . Sexual Activity: Not on file   Other Topics Concern  . Not on file   Social History Narrative     Review of Systems  General:  No chills, fever, night sweats or weight changes.  Cardiovascular:  Positive for dyspnea at rest Dermatological: No rash, lesions/masses Respiratory: No cough, dyspnea Urologic: No hematuria,  dysuria Abdominal:   No nausea, vomiting, diarrhea, bright red blood per rectum, melena, or hematemesis Neurologic:  Dementia is present. All other systems reviewed and are otherwise negative except as noted above.  Physical Exam  Blood pressure 103/86, pulse 71, temperature 98.3 F (36.8 C), temperature source Oral, resp. rate 16, height 5\' 6"  (1.676 m), weight 146 lb 6.2 oz (66.4 kg), SpO2 100 %.  General: Pleasant, NAD Psych: Normal affect.  Past history of severe depression and he required ECT in the past Neuro: Alert and oriented X 3. Moves all extremities spontaneously. HEENT: Normal  Neck: Jugular venous pressure is elevated. Lungs:  Chest reveals bilateral rales and wheezes. Heart: Rapid irregular heart rate.  No murmur gallop or rub Abdomen: Soft, non-tender, non-distended, BS + x 4.  Extremities: No clubbing, cyanosis or edema. DP/PT/Radials 1+ and equal bilaterally.  Labs  No results for input(s): CKTOTAL, CKMB, TROPONINI in the last 72 hours. Lab Results  Component Value Date   WBC 6.3 11/11/2014   HGB 10.7* 11/11/2014   HCT 34.2* 11/11/2014   MCV 82.2 11/11/2014   PLT 249 11/11/2014     Recent Labs Lab 11/11/14 1225  NA 143  K 5.2  CL 107  CO2 22  BUN 20  CREATININE 0.93  CALCIUM 9.6  GLUCOSE 171*   Lab Results  Component Value Date   CHOL  09/05/2008    173        ATP III CLASSIFICATION:  <200     mg/dL   Desirable  161-096  mg/dL   Borderline High  >=045    mg/dL   High   HDL 35* 40/98/1191   LDLCALC * 09/05/2008    122        Total Cholesterol/HDL:CHD Risk Coronary Heart Disease Risk Table                     Men   Women  1/2 Average Risk   3.4   3.3   TRIG 81 09/05/2008   Lab Results  Component Value Date   DDIMER * 12/27/2010    8.02        AT THE INHOUSE ESTABLISHED CUTOFF VALUE OF 0.48 ug/mL FEU, THIS ASSAY HAS BEEN DOCUMENTED IN THE LITERATURE TO HAVE A SENSITIVITY AND NEGATIVE PREDICTIVE VALUE OF AT LEAST 98 TO 99%.  THE TEST  RESULT SHOULD BE CORRELATED WITH AN ASSESSMENT OF THE CLINICAL PROBABILITY OF DVT / VTE.    Radiology/Studies  Dg Chest 2 View (if Patient Has Fever And/or Copd)  11/11/2014   CLINICAL DATA:  Shortness of breath, history of Alzheimer's disease  EXAM: CHEST  2 VIEW  COMPARISON:  Portable chest x-ray  of 12/27/2010  FINDINGS: There is moderate cardiomegaly present with pulmonary vascular congestion and probable small effusions most consistent with congestive heart failure. An AICD lead remains. There are degenerative changes noted in both shoulders.  IMPRESSION: Cardiomegaly, pulmonary vascular congestion, and effusions most consistent with CHF.   Electronically Signed   By: Dwyane Dee M.D.   On: 11/11/2014 13:10    ECG  Atrial fibrillation with rapid ventricular response.  Old left bundle-branch block.  Occasional ventricular paced beats.  ASSESSMENT AND PLAN 1.  Congestive heart failure, type to be determined.  Needs updated echocardiogram. 2.  New onset of atrial fibrillation since prior tracing in 2013 3.  Past history of nonischemic cardiomyopathy.  Normal coronary arteries in 2005. 4.  Dementia 5.  Significantly elevated d-dimer at 8.02.  Consider CT angiogram to rule out pulmonary emboli.  The patient has not been on any anticoagulation and has been at prolonged bed rest at the nursing home  Plan: IV heparin for atrial fibrillation.  Will obtain CT angiogram to rule out pulmonary emboli as a precipitating cause of his atrial fibrillation and his increased dyspnea and hypoxemia.  IV Lasix for CHF.  Blood pressure is soft.  We will use small doses of metoprolol for rate control.  Will need to clarify CODE STATUS      Signed, Cassell Clement, MD 11/11/2014, 5:04 PM

## 2014-11-11 NOTE — Progress Notes (Signed)
Pt refusing CT with angio this evening. States he will go in the morning but is tired of being moved and just wants to be left alone.Pt educated as to why he needs scan, but still adamantly refuses. Pt stated he will refuse to comply to scan if done tonight. CT alerted pt will go in AM. Will continue to monitor.

## 2014-11-11 NOTE — Progress Notes (Signed)
Pt refusing all blood draws this evening. Pt becomes agitated and yells at staff to leave or he will become less compliant.Will continue to  Monitor.

## 2014-11-11 NOTE — Progress Notes (Signed)
ANTICOAGULATION CONSULT NOTE - Initial Consult  Pharmacy Consult for Heparin Indication: atrial fibrillation  Allergies  Allergen Reactions  . Morphine Other (See Comments)    Per MAR  . Wellbutrin [Bupropion] Other (See Comments)    Per [facility]    Patient Measurements: Height: 5\' 6"  (167.6 cm) Weight: 146 lb 6.2 oz (66.4 kg) IBW/kg (Calculated) : 63.8 Heparin Dosing Weight: actual weight  Vital Signs: Temp: 98.3 F (36.8 C) (11/19 1500) Temp Source: Oral (11/19 1500) BP: 103/86 mmHg (11/19 1500) Pulse Rate: 71 (11/19 1500)  Labs:  Recent Labs  11/11/14 1225 11/11/14 1624  HGB 10.7*  --   HCT 34.2*  --   PLT 249  --   APTT  --  35  LABPROT  --  16.3*  INR  --  1.30  CREATININE 0.93  --    Estimated Creatinine Clearance: 51.5 mL/min (by C-G formula based on Cr of 0.93).  Medical History: Past Medical History  Diagnosis Date  . UTI (lower urinary tract infection)   . Cardiomyopathy   . Insomnia   . Obesity   . AICD (automatic cardioverter/defibrillator) present   . Hypertension   . Vitamin D deficiency   . Sleep apnea     no cpap  . Dementia   . Depressive disorder    Medications:  Scheduled:  . calcium-vitamin D  1 tablet Oral Daily  . [START ON 11/12/2014] furosemide  20 mg Intravenous Daily  . iron polysaccharides  150 mg Oral Q lunch  . polyethylene glycol  17 g Oral Daily  . [START ON 11/12/2014] senna  2 tablet Oral Daily  . sodium chloride  3 mL Intravenous Q12H  . tamsulosin  0.4 mg Oral QPC supper   Infusions:    Assessment: 88 yoM to ED with [facility] and hypoxemia, hx of cardiomyopathy, pacemaker; found in Afib. Elevated D-dimer noted, plan chest CT to r/o PE.   Heparin ordered by Cardiology for AFib  Serial troponins ordered  Goal of Therapy:  Heparin level 0.3-0.7 units/ml Monitor platelets by anticoagulation protocol: Yes   Plan:   Heparin bolus 1700 units (reduced from suggested 2000 units-elderly) - given at 1830  Heparin  infusion at 1200 units/hr  Heparin level at 2am tomorrow 11/20  Daily heparin level and CBC  Otho Bellows PharmD Pager [phone] 11/11/2014, 6:43 PM

## 2014-11-11 NOTE — Progress Notes (Signed)
Pt Ht rate 129, RR 35-40, Sat 95% on 3 lpm [place]. CXR just obtained @ 1255 waiting for results. Placed pt on V60 BIPAP, I:10, E:5 @ 40% X 30 minutes. No change in Ht rate or RR with BIPAP. Pt BBS's coarse with scattered wheeze. Neb tx given with 0.63 xopenex/ 0.5 atrovent. BIPAP taken off pt at 1330. RT will continue to monitor.

## 2014-11-11 NOTE — ED Notes (Addendum)
Pager still did not go off but called within 20 min both charge tara and rachel notified.Marland Kitchenklj Assist  notified.Marland Kitchenklj

## 2014-11-11 NOTE — ED Provider Notes (Signed)
CSN: [id number]     Arrival date & time 11/11/14  1200 History   First MD Initiated Contact with Patient 11/11/14 1200     Chief Complaint  Patient presents with  . Shortness of Breath     (Consider location/radiation/quality/duration/timing/severity/associated sxs/prior Treatment) HPI Comments: Pt comes in with cc of dib. Pt is DNR/DNI, and has hx of afib, cardiomyopathy, not on lasix, and HTN, dementia. He comes in with cc of dib. Per wife, pt has been having worsening dib since before the weekend. Today, pt was found to be hypoxic, and thus EMS was called. Pt has cough - productive with yellw phlegm, but he denies any chest pains. He has dib, but denies any orthopnea or PND like sx. Pt has no hx of DVT, PE, but he is not ambulating for the past 4 years. There is no hx of smoking. No fevers, no uri like sx. Per EMS report - pt was saturating in the low 80s and 70s when they arrived, and he went upto mid 90s with breathing tx.  Patient is a 80 y.o. male presenting with shortness of breath. The history is provided by the patient and a relative.  Shortness of Breath Associated symptoms: cough and wheezing   Associated symptoms: no abdominal pain, no chest pain, no fever and no rash     Past Medical History  Diagnosis Date  . UTI (lower urinary tract infection)   . Cardiomyopathy   . Insomnia   . Obesity   . AICD (automatic cardioverter/defibrillator) present   . Hypertension   . Vitamin D deficiency   . Sleep apnea     no cpap  . Dementia   . Depressive disorder    Past Surgical History  Procedure Laterality Date  . Total knee arthroplasty      left  . Back surgery    . Cardiac defibrillator placement    . Esophagogastroduodenoscopy  06/28/2012    Procedure: ESOPHAGOGASTRODUODENOSCOPY (EGD);  Surgeon: Petra KubaMarc E Magod, MD;  [facility];  Service: Endoscopy;  Laterality: N/A;   Family History  Problem Relation Age of Onset  . Stroke Mother   . Lung cancer Brother    . Brain cancer Brother    History  Substance Use Topics  . Smoking status: Never Smoker   . Smokeless tobacco: Not on file  . Alcohol Use: No    Review of Systems  Constitutional: Positive for activity change. Negative for fever and chills.  HENT: Negative for congestion and rhinorrhea.   Eyes: Negative for visual disturbance.  Respiratory: Positive for cough, shortness of breath and wheezing. Negative for chest tightness.   Cardiovascular: Negative for chest pain.  Gastrointestinal: Negative for abdominal pain and abdominal distention.  Genitourinary: Negative for dysuria, enuresis and difficulty urinating.  Musculoskeletal: Negative for arthralgias.  Skin: Negative for rash.  Allergic/Immunologic: Negative for immunocompromised state.  Neurological: Negative for dizziness and light-headedness.  Hematological: Does not bruise/bleed easily.  Psychiatric/Behavioral: Negative for confusion.      Allergies  Morphine and Wellbutrin  Home Medications   Prior to Admission medications   Medication Sig Start Date End Date Taking? Authorizing Provider  Calcium Carb-Cholecalciferol (CALCIUM-VITAMIN D) 500-400 MG-UNIT TABS Take 1 tablet by mouth daily.   Yes Historical Provider, MD  guaiFENesin (ROBITUSSIN) 100 MG/5ML SOLN Take 10 mLs by mouth every 6 (six) hours as needed for cough.   Yes Historical Provider, MD  iron polysaccharides (NIFEREX) 150 MG capsule Take 150 mg by mouth daily.  Yes Historical Provider, MD  levofloxacin (LEVAQUIN) 500 MG tablet Take 500 mg by mouth daily. For 7 days 11/05/14  Yes Historical Provider, MD  magnesium hydroxide (MILK OF MAGNESIA) 400 MG/5ML suspension Take 30 mLs by mouth daily as needed for mild constipation.   Yes Historical Provider, MD  polyethylene glycol (MIRALAX / GLYCOLAX) packet Take 17 g by mouth daily.   Yes Historical Provider, MD  senna (SENOKOT) 8.6 MG tablet Take 2 tablets by mouth daily.   Yes Historical Provider, MD  Tamsulosin  HCl (FLOMAX) 0.4 MG CAPS Take 1 capsule (0.4 mg total) by mouth daily after supper. 07/01/12  Yes Calvert Cantor, MD  traMADol (ULTRAM) 50 MG tablet Take 50 mg by mouth every 6 (six) hours as needed. For pain   Yes Historical Provider, MD   BP 112/81 mmHg  Pulse 120  Temp(Src) 97.9 F (36.6 C) (Oral)  Resp 25  SpO2 99% Physical Exam  Constitutional: He is oriented to person, place, and time. He appears well-developed.  HENT:  Head: Normocephalic and atraumatic.  Eyes: Conjunctivae and EOM are normal. Pupils are equal, round, and reactive to light.  Neck: Normal range of motion. Neck supple. No JVD present.  Cardiovascular: Normal rate and regular rhythm.   Pulmonary/Chest: He is in respiratory distress. He has no wheezes. He has rales.  Diffuse rhonchus breath sounds.   Abdominal: Soft. Bowel sounds are normal. He exhibits no distension. There is no tenderness. There is no rebound and no guarding.  Musculoskeletal: He exhibits no edema.  Lymphadenopathy:    He has no cervical adenopathy.  Neurological: He is alert and oriented to person, place, and time.  Skin: Skin is warm.  Nursing note and vitals reviewed.   ED Course  Procedures (including critical care time) Labs Review Labs Reviewed  BASIC METABOLIC PANEL - Abnormal; Notable for the following:    Glucose, Bld 171 (*)    GFR calc non Af Amer 74 (*)    GFR calc Af Amer 86 (*)    All other components within normal limits  CBC - Abnormal; Notable for the following:    RBC 4.16 (*)    Hemoglobin 10.7 (*)    HCT 34.2 (*)    MCH 25.7 (*)    RDW 18.1 (*)    All other components within normal limits  PRO B NATRIURETIC PEPTIDE - Abnormal; Notable for the following:    Pro B Natriuretic peptide (BNP) 15640.0 (*)    All other components within normal limits  I-STAT TROPOININ, ED    Imaging Review Dg Chest 2 View (if Patient Has Fever And/or Copd)  11/11/2014   CLINICAL DATA:  Shortness of breath, history of Alzheimer's  disease  EXAM: CHEST  2 VIEW  COMPARISON:  Portable chest x-ray of 12/27/2010  FINDINGS: There is moderate cardiomegaly present with pulmonary vascular congestion and probable small effusions most consistent with congestive heart failure. An AICD lead remains. There are degenerative changes noted in both shoulders.  IMPRESSION: Cardiomegaly, pulmonary vascular congestion, and effusions most consistent with CHF.   Electronically Signed   By: Dwyane Dee M.D.   On: 11/11/2014 13:10     EKG Interpretation   Date/Time:  Thursday November 11 2014 12:25:58 EST Ventricular Rate:  126 PR Interval:    QRS Duration: 139 QT Interval:  335 QTC Calculation: 485 R Axis:   85 Text Interpretation:  Atrial fibrillation Ventricular bigeminy  Intraventricular conduction delay likely fasciluar block. New t wave  inversion  in inferior leads and v6 Abnormal ECG Confirmed by Rhunette Croft, MD,  Janey Genta [phone]) on 11/11/2014 12:52:06 PM      CRITICAL CARE Performed by: Derwood Kaplan   Total critical care time: 45 min  Critical care time was exclusive of separately billable procedures and treating other patients.  Critical care was necessary to treat or prevent imminent or life-threatening deterioration.  Critical care was time spent personally by me on the following activities: development of treatment plan with patient and/or surrogate as well as nursing, discussions with consultants, evaluation of patient's response to treatment, examination of patient, obtaining history from patient or surrogate, ordering and performing treatments and interventions, ordering and review of laboratory studies, ordering and review of radiographic studies, pulse oximetry and re-evaluation of patient's condition.   MDM   Final diagnoses:  Acute systolic congestive heart failure  Acute pulmonary edema  Acute respiratory failure with hypoxia    Pt comes in with cc of respiratory distress, and in hypoxic failure. PT on  supplemental O2, per nasal canula, and O2 sats are in the mid 90s, but the RR is the 20s and 30s. Pt is DNR, DNI - CPAP started, just to take work of breathing away - and pt tolerated that for about an hour. In the interim, her xrays show pulm edema and BNP is very elevated. In light of the Xray findings, we will not get PE workup - and tx pt as new onset acute CHF with pulm edema and admit. Laix naive, so 20mg  iv is all we will give him in the ER.     Derwood Kaplan, MD 11/11/14 1440

## 2014-11-11 NOTE — H&P (Signed)
[facility] History and Physical  Kevin [name] ZOX:[id number] DOB: 25-Jun-1925 DOA: 11/11/2014  Referring physician: ER physician PCP: Londell Moh, MD   Chief Complaint: shortness of breath   HPI:  57 year year old male with past medical history of dementia, history of atrial fibrillation, hypertension who presented from SNF with worsening shortness of breath for past few days prior to this admission. No complaints of chest pain, palpitations. No fevers or chills. Patient did report cough productive of clear to yellowish sputum. No abdominal pain, nausea or vomiting. No lightheadedness or loss of consciousness. No urinary complaints. In ED. BP was 122/103, HR 71-120, oxygen saturation 95%. The 12 lead EKG showed atrial fibrillation. BNP was elevated in 15 K range. CXR showed cardiomegaly, pulmonary vascular congestion consistent with CHF. Cardio will see the patient in consultation.   Assessment & Plan    Principal Problem:   Acute diastolic CHF (congestive heart failure) / atrial fibrillation  - BNP elevated in 15 K range. Last 2 D ECHO in EPIC from 2015 with preserved EF - needs updated 2 D ECHO on this admission - per cardio will initiate heparin for anticoagulation - low dose lasix IV due to soft BP; low dose beta blocker per cardio - per cardio, CT angio chest to rule out pulmonary embolism  - strict intake and output; daily weight - replete electrolytes as needed   Active Problems:   BPH (benign prostatic hypertrophy)  - resume flomax   Alzheimer's dementia  - stable   DVT prophylaxis:  - Heparin IV for atrial fibrillation     Radiological Exams on Admission: Dg Chest 2 View (if Patient Has Fever And/or Copd) 11/11/2014  Cardiomegaly, pulmonary vascular congestion, and effusions most consistent with CHF.      EKG: atrial fibrillation   Code Status: DNR/DNI Family Communication: Family not at the bedside   Disposition Plan: Admit for further  evaluation; telemetry   Manson Passey, MD  [facility] Pager [phone]  Review of Systems:  Constitutional: Negative for fever, chills. Negative for diaphoresis.  HENT: Negative for hearing loss, ear pain, nosebleeds, congestion, sore throat, neck pain, tinnitus and ear discharge.   Eyes: Negative for blurred vision, double vision, photophobia, pain, discharge and redness.  Respiratory: positive for shortness of breath  Cardiovascular: Negative for chest pain, palpitations, orthopnea, claudication and leg swelling.  Gastrointestinal: Negative for nausea, vomiting and abdominal pain. Negative for heartburn, constipation, blood in stool and melena.  Genitourinary: Negative for dysuria, urgency, frequency, hematuria and flank pain.  Musculoskeletal: Negative for myalgias, back pain, joint pain and falls.  Skin: Negative for itching and rash.  Neurological: Negative for dizziness and weakness. Negative for tingling, tremors, sensory change, speech change, focal weakness, loss of consciousness and headaches.  Endo/Heme/Allergies: Negative for environmental allergies and polydipsia. Does not bruise/bleed easily.  Psychiatric/Behavioral: Negative for suicidal ideas. The patient is not nervous/anxious.      Past Medical History  Diagnosis Date  . UTI (lower urinary tract infection)   . Cardiomyopathy   . Insomnia   . Obesity   . AICD (automatic cardioverter/defibrillator) present   . Hypertension   . Vitamin D deficiency   . Sleep apnea     no cpap  . Dementia   . Depressive disorder    Past Surgical History  Procedure Laterality Date  . Total knee arthroplasty      left  . Back surgery    . Cardiac defibrillator placement    . Esophagogastroduodenoscopy  06/28/2012    Procedure: ESOPHAGOGASTRODUODENOSCOPY (EGD);  Surgeon: Petra KubaMarc E Magod, MD;  [facility];  Service: Endoscopy;  Laterality: N/A;   Social History:  reports that he has never smoked. He has never used  smokeless tobacco. He reports that he does not drink alcohol or use illicit drugs.  Allergies  Allergen Reactions  . Morphine Other (See Comments)    Per MAR  . Wellbutrin [Bupropion] Other (See Comments)    Per MAR     Family History:  Family History  Problem Relation Age of Onset  . Stroke Mother   . Lung cancer Brother   . Brain cancer Brother      Prior to Admission medications   Medication Sig Start Date End Date Taking? Authorizing Provider  Calcium Carb-Cholecalciferol (CALCIUM-VITAMIN D) 500-400 MG-UNIT TABS Take 1 tablet by mouth daily.   Yes Historical Provider, MD  guaiFENesin (ROBITUSSIN) 100 MG/5ML SOLN Take 10 mLs by mouth every 6 (six) hours as needed for cough.   Yes Historical Provider, MD  iron polysaccharides (NIFEREX) 150 MG capsule Take 150 mg by mouth daily.   Yes Historical Provider, MD  levofloxacin (LEVAQUIN) 500 MG tablet Take 500 mg by mouth daily. For 7 days 11/05/14  Yes Historical Provider, MD  magnesium hydroxide (MILK OF MAGNESIA) 400 MG/5ML suspension Take 30 mLs by mouth daily as needed for mild constipation.   Yes Historical Provider, MD  polyethylene glycol (MIRALAX / GLYCOLAX) packet Take 17 g by mouth daily.   Yes Historical Provider, MD  senna (SENOKOT) 8.6 MG tablet Take 2 tablets by mouth daily.   Yes Historical Provider, MD  Tamsulosin HCl (FLOMAX) 0.4 MG CAPS Take 1 capsule (0.4 mg total) by mouth daily after supper. 07/01/12  Yes Calvert CantorSaima Rizwan, MD  traMADol (ULTRAM) 50 MG tablet Take 50 mg by mouth every 6 (six) hours as needed. For pain   Yes Historical Provider, MD   Physical Exam: Filed Vitals:   11/11/14 1202 11/11/14 1330 11/11/14 1426 11/11/14 1500  BP: 122/78  112/81 103/86  Pulse: 72  120 71  Temp: 97.9 F (36.6 C)   98.3 F (36.8 C)  TempSrc: Oral   Oral  Resp: 20  25 16   SpO2: 97% 95% 99% 100%    Physical Exam  Constitutional: Appears well-developed and well-nourished. No distress.  HENT: Normocephalic. No tonsillar  erythema or exudates Eyes: Conjunctivae and EOM are normal. PERRLA, no scleral icterus.  Neck: Normal ROM. Neck supple. No JVD. No tracheal deviation. No thyromegaly.  CVS: irregular heart rate, tachycardic, S1/S2 appreciated  Pulmonary: diminished breath sounds, no wheezing   Abdominal: Soft. BS +,  no distension, tenderness, rebound or guarding.  Musculoskeletal: Normal range of motion. No edema and no tenderness.  Lymphadenopathy: No lymphadenopathy noted, cervical, inguinal. Neuro: Alert.  No focal neurologic deficits. Skin: Skin is warm and dry. No rash noted.  Psychiatric: Normal mood and affect. Behavior, judgment, thought content normal.   Labs on Admission:  Basic Metabolic Panel:  Recent Labs Lab 11/11/14 1225  NA 143  K 5.2  CL 107  CO2 22  GLUCOSE 171*  BUN 20  CREATININE 0.93  CALCIUM 9.6   Liver Function Tests: No results for input(s): AST, ALT, ALKPHOS, BILITOT, PROT, ALBUMIN in the last 168 hours. No results for input(s): LIPASE, AMYLASE in the last 168 hours. No results for input(s): AMMONIA in the last 168 hours. CBC:  Recent Labs Lab 11/11/14 1225  WBC 6.3  HGB 10.7*  HCT  34.2*  MCV 82.2  PLT 249   Cardiac Enzymes: No results for input(s): CKTOTAL, CKMB, CKMBINDEX, TROPONINI in the last 168 hours. BNP: Invalid input(s): POCBNP CBG: No results for input(s): GLUCAP in the last 168 hours.  If 7PM-7AM, please contact night-coverage [email or web address] Password [email or web address] 11/11/2014, 3:53 PM

## 2014-11-11 NOTE — ED Notes (Signed)
Bed: WA16 Expected date:  Expected time:  Means of arrival:  Comments: SOB 

## 2014-11-11 NOTE — ED Notes (Signed)
Pt from [facility] nursing home, hx of alzheimers. Per EMS pt at facility after recovering from respiratory infection. Staff told EMS pt had been refusing medications. When they arrived pt was SOB, gave 5mg  albuterol and 125mg  prior to arrival. Per EMS pt initially cyanotic, but has decreased since nebulizer.

## 2014-11-12 ENCOUNTER — Inpatient Hospital Stay (HOSPITAL_COMMUNITY): Payer: Medicare Other

## 2014-11-12 DIAGNOSIS — G309 Alzheimer's disease, unspecified: Secondary | ICD-10-CM

## 2014-11-12 DIAGNOSIS — I4891 Unspecified atrial fibrillation: Secondary | ICD-10-CM | POA: Diagnosis present

## 2014-11-12 DIAGNOSIS — I059 Rheumatic mitral valve disease, unspecified: Secondary | ICD-10-CM

## 2014-11-12 DIAGNOSIS — F0280 Dementia in other diseases classified elsewhere without behavioral disturbance: Secondary | ICD-10-CM

## 2014-11-12 DIAGNOSIS — I5041 Acute combined systolic (congestive) and diastolic (congestive) heart failure: Secondary | ICD-10-CM | POA: Diagnosis present

## 2014-11-12 DIAGNOSIS — I481 Persistent atrial fibrillation: Secondary | ICD-10-CM

## 2014-11-12 DIAGNOSIS — I5021 Acute systolic (congestive) heart failure: Secondary | ICD-10-CM

## 2014-11-12 DIAGNOSIS — N400 Enlarged prostate without lower urinary tract symptoms: Secondary | ICD-10-CM

## 2014-11-12 LAB — CBC
HEMATOCRIT: 32.8 % — AB (ref 39.0–52.0)
HEMOGLOBIN: 10.1 g/dL — AB (ref 13.0–17.0)
MCH: 25.1 pg — AB (ref 26.0–34.0)
MCHC: 30.8 g/dL (ref 30.0–36.0)
MCV: 81.4 fL (ref 78.0–100.0)
Platelets: 219 10*3/uL (ref 150–400)
RBC: 4.03 MIL/uL — ABNORMAL LOW (ref 4.22–5.81)
RDW: 18 % — AB (ref 11.5–15.5)
WBC: 3.3 10*3/uL — ABNORMAL LOW (ref 4.0–10.5)

## 2014-11-12 LAB — BASIC METABOLIC PANEL
Anion gap: 13 (ref 5–15)
BUN: 25 mg/dL — ABNORMAL HIGH (ref 6–23)
CALCIUM: 9.5 mg/dL (ref 8.4–10.5)
CO2: 21 mEq/L (ref 19–32)
Chloride: 104 mEq/L (ref 96–112)
Creatinine, Ser: 1.01 mg/dL (ref 0.50–1.35)
GFR calc non Af Amer: 65 mL/min — ABNORMAL LOW (ref >90)
GFR, EST AFRICAN AMERICAN: 76 mL/min — AB (ref >90)
GLUCOSE: 168 mg/dL — AB (ref 70–99)
Potassium: 4.6 mEq/L (ref 3.7–5.3)
Sodium: 138 mEq/L (ref 137–147)

## 2014-11-12 LAB — COMPREHENSIVE METABOLIC PANEL
ALBUMIN: 3 g/dL — AB (ref 3.5–5.2)
ALT: 15 U/L (ref 0–53)
AST: 21 U/L (ref 0–37)
Alkaline Phosphatase: 110 U/L (ref 39–117)
Anion gap: 15 (ref 5–15)
BUN: 20 mg/dL (ref 6–23)
CALCIUM: 9.4 mg/dL (ref 8.4–10.5)
CO2: 21 mEq/L (ref 19–32)
Chloride: 103 mEq/L (ref 96–112)
Creatinine, Ser: 1.01 mg/dL (ref 0.50–1.35)
GFR calc non Af Amer: 65 mL/min — ABNORMAL LOW (ref >90)
GFR, EST AFRICAN AMERICAN: 76 mL/min — AB (ref >90)
GLUCOSE: 170 mg/dL — AB (ref 70–99)
POTASSIUM: 4.4 meq/L (ref 3.7–5.3)
Sodium: 139 mEq/L (ref 137–147)
TOTAL PROTEIN: 7 g/dL (ref 6.0–8.3)
Total Bilirubin: 0.4 mg/dL (ref 0.3–1.2)

## 2014-11-12 LAB — TROPONIN I
Troponin I: <0.3 ng/mL (ref <0.30)
Troponin I: <0.3 ng/mL (ref <0.30)
Troponin I: <0.3 ng/mL (ref <0.30)

## 2014-11-12 LAB — HEPARIN LEVEL (UNFRACTIONATED)
HEPARIN UNFRACTIONATED: 0.54 [IU]/mL (ref 0.30–0.70)
Heparin Unfractionated: 0.26 IU/mL — ABNORMAL LOW (ref 0.30–0.70)
Heparin Unfractionated: 0.22 IU/mL — ABNORMAL LOW (ref 0.30–0.70)

## 2014-11-12 LAB — MAGNESIUM: MAGNESIUM: 2.6 mg/dL — AB (ref 1.5–2.5)

## 2014-11-12 MED ORDER — AMIODARONE LOAD VIA INFUSION
150.0000 mg | Freq: Once | INTRAVENOUS | Status: AC
  Administered 2014-11-12: 150 mg via INTRAVENOUS
  Filled 2014-11-12: qty 83.34

## 2014-11-12 MED ORDER — FUROSEMIDE 10 MG/ML IJ SOLN
40.0000 mg | Freq: Every day | INTRAMUSCULAR | Status: DC
Start: 2014-11-12 — End: 2014-11-16
  Administered 2014-11-12 – 2014-11-16 (×5): 40 mg via INTRAVENOUS
  Filled 2014-11-12 (×5): qty 4

## 2014-11-12 MED ORDER — AMIODARONE HCL IN DEXTROSE 360-4.14 MG/200ML-% IV SOLN
30.0000 mg/h | INTRAVENOUS | Status: DC
  Administered 2014-11-12 – 2014-11-14 (×4): 30 mg/h via INTRAVENOUS
  Filled 2014-11-12 (×5): qty 200

## 2014-11-12 MED ORDER — LORAZEPAM 0.5 MG PO TABS
0.5000 mg | ORAL_TABLET | Freq: Three times a day (TID) | ORAL | Status: DC | PRN
  Administered 2014-11-12 – 2014-11-15 (×5): 0.5 mg via ORAL
  Filled 2014-11-12 (×5): qty 1

## 2014-11-12 MED ORDER — HEPARIN (PORCINE) IN NACL 100-0.45 UNIT/ML-% IJ SOLN
1500.0000 [IU]/h | INTRAMUSCULAR | Status: DC
  Administered 2014-11-12 – 2014-11-16 (×6): 1500 [IU]/h via INTRAVENOUS
  Filled 2014-11-12 (×9): qty 250

## 2014-11-12 MED ORDER — HEPARIN (PORCINE) IN NACL 100-0.45 UNIT/ML-% IJ SOLN
1350.0000 [IU]/h | INTRAMUSCULAR | Status: DC
  Administered 2014-11-12: 1350 [IU]/h via INTRAVENOUS
  Filled 2014-11-12: qty 250

## 2014-11-12 MED ORDER — AMIODARONE HCL IN DEXTROSE 360-4.14 MG/200ML-% IV SOLN
60.0000 mg/h | INTRAVENOUS | Status: AC
  Administered 2014-11-12: 60 mg/h via INTRAVENOUS
  Filled 2014-11-12 (×2): qty 200

## 2014-11-12 MED ORDER — IOHEXOL 350 MG/ML SOLN
100.0000 mL | Freq: Once | INTRAVENOUS | Status: AC | PRN
Start: 2014-11-12 — End: 2014-11-12
  Administered 2014-11-12: 100 mL via INTRAVENOUS

## 2014-11-12 NOTE — Progress Notes (Signed)
[facility] PROGRESS NOTE  Kevin FishermanLloyd W Mabile WGN:[id number] DOB: 1925/02/19 DOA: 11/11/2014 PCP: Londell MohPHARR,WALTER DAVIDSON, MD  Assessment/Plan: #1 acute systolic CHF exacerbation Questionable etiology. Some clinical improvement. Cardiac enzymes negative 2. 2-D echo with EF of 20-25% with dyskinesis of the entire anteroseptal myocardium which is different from prior 2-D echo. Patient's diuretic dose has been increased per cardiology to 40 mg IV daily. Patient with borderline blood pressure and a such beta blocker has been held. Unable to place on ACE inhibitor secondary to borderline blood pressure issues. Cardiology following and appreciate input and recommendations.  #2 new onset atrial fibrillation Unknown etiology. Cardiac enzymes negative 2. 2-D echo with depressed EF of 20-25% with dyskinesis of the entire anteroseptal myocardium which is new from prior 2-D echo. Patient's metoprolol has been held secondary to hypotension. Patient has been started on IV amiodarone per cardiology. Due to patient's dementia is felt not to be a candidate for long-term anticoagulation. Cardiology following and appreciate input and recommendations.  #3 dementia  #4 elevated d-dimer CT angiogram negative for large or central PE. Moderate sized bilateral pleural effusions with adjacent compressive atelectasis is noted.  #5 BPH Continue Flomax.  #6 hypertension Blood pressure medications on hold secondary to hypotension.  #7 prophylaxis Heparin for DVT prophylaxis.  Code Status: DO NOT RESUSCITATE Family Communication: Updated patient of family at bedside. Disposition Plan: Home when medically stable.   Consultants:  Cardiology: Dr. Patty SermonsBrackbill 11/11/2014  Procedures:  2-D echo 11/12/2014  CT angiogram chest 11/12/2014  Chest x-ray 11/11/2014  Antibiotics:  None  HPI/Subjective: Patient states he's not doing well. Patient states everything is wrong and everything is terrible. Patient  denies any chest pain. Patient states some improvement in his shortness of breath. Patient states doctors torturing him to keep him alive.  Objective: Filed Vitals:   11/12/14 0944  BP: 102/77  Pulse: 55  Temp: 97.3 F (36.3 C)  Resp: 34    Intake/Output Summary (Last 24 hours) at 11/12/14 1045 Last data filed at 11/12/14 0600  Gross per 24 hour  Intake 100.18 ml  Output      0 ml  Net 100.18 ml   Filed Weights   11/11/14 1500 11/12/14 0500  Weight: 66.4 kg (146 lb 6.2 oz) 64.7 kg (142 lb 10.2 oz)    Exam:   General:  NAD  Cardiovascular: Irregularly irregular  Respiratory: Bibasilar crackles.  Abdomen: , Nontender, nondistended, positive bowel sounds.  Musculoskeletal: No clubbing cyanosis or edema.  Data Reviewed: Basic Metabolic Panel:  Recent Labs Lab 11/11/14 1225 11/11/14 1624 11/12/14 0546  NA 143 139 138  K 5.2 4.4 4.6  CL 107 103 104  CO2 22 21 21   GLUCOSE 171* 170* 168*  BUN 20 20 25*  CREATININE 0.93 1.01 1.01  CALCIUM 9.6 9.4 9.5  MG  --  2.6*  --    Liver Function Tests:  Recent Labs Lab 11/11/14 1624  AST 21  ALT 15  ALKPHOS 110  BILITOT 0.4  PROT 7.0  ALBUMIN 3.0*   No results for input(s): LIPASE, AMYLASE in the last 168 hours. No results for input(s): AMMONIA in the last 168 hours. CBC:  Recent Labs Lab 11/11/14 1225 11/12/14 0546  WBC 6.3 3.3*  HGB 10.7* 10.1*  HCT 34.2* 32.8*  MCV 82.2 81.4  PLT 249 219   Cardiac Enzymes:  Recent Labs Lab 11/11/14 1812 11/12/14 0740  TROPONINI <0.30 <0.30   BNP (last 3 results)  Recent Labs  11/11/14 1252 11/11/14 1624  PROBNP 15640.0* 15842.0*   CBG: No results for input(s): GLUCAP in the last 168 hours.  Recent Results (from the past 240 hour(s))  MRSA PCR Screening     Status: Abnormal   Collection Time: 11/11/14  5:46 PM  Result Value Ref Range Status   MRSA by PCR POSITIVE (A) NEGATIVE Final    Comment:        The GeneXpert MRSA Assay (FDA approved for  NASAL specimens only), is one component of a comprehensive MRSA colonization surveillance program. It is not intended to diagnose MRSA infection nor to guide or monitor treatment for MRSA infections. RESULT CALLED TO, READ BACK BY AND VERIFIED WITH: L.REINERT,RN AT 2207 ON 11/11/14 BY SHEAW      Studies: Dg Chest 2 View (if Patient Has Fever And/or Copd)  11/11/2014   CLINICAL DATA:  Shortness of breath, history of Alzheimer's disease  EXAM: CHEST  2 VIEW  COMPARISON:  Portable chest x-ray of 12/27/2010  FINDINGS: There is moderate cardiomegaly present with pulmonary vascular congestion and probable small effusions most consistent with congestive heart failure. An AICD lead remains. There are degenerative changes noted in both shoulders.  IMPRESSION: Cardiomegaly, pulmonary vascular congestion, and effusions most consistent with CHF.   Electronically Signed   By: Dwyane Dee M.D.   On: 11/11/2014 13:10    Scheduled Meds: . calcium-vitamin D  1 tablet Oral Daily  . furosemide  40 mg Intravenous Daily  . iron polysaccharides  150 mg Oral Q lunch  . polyethylene glycol  17 g Oral Daily  . senna  2 tablet Oral Daily  . sodium chloride  3 mL Intravenous Q12H  . tamsulosin  0.4 mg Oral QPC supper   Continuous Infusions: . amiodarone 60 mg/hr (11/12/14 0913)   Followed by  . amiodarone    . heparin 1,350 Units/hr (11/12/14 0313)    Principal Problem:   Acute diastolic CHF (congestive heart failure) Active Problems:   BPH (benign prostatic hypertrophy) with urinary retention   Constipation   Alzheimer's dementia   Atrial fibrillation    Time spent: 35 minutes    THOMPSON,DANIEL M.D. [facility] Pager [phone]. If 7PM-7AM, please contact night-coverage at [email or web address], password [facility] 11/12/2014, 10:45 AM  LOS: 1 day

## 2014-11-12 NOTE — Progress Notes (Addendum)
ANTICOAGULATION CONSULT NOTE - follow-up  Pharmacy Consult for Heparin Indication: atrial fibrillation  Allergies  Allergen Reactions  . Morphine Other (See Comments)    Per MAR  . Wellbutrin [Bupropion] Other (See Comments)    Per [facility]    Patient Measurements: Height: 5\' 6"  (167.6 cm) Weight: 142 lb 10.2 oz (64.7 kg) IBW/kg (Calculated) : 63.8 Heparin Dosing Weight: actual weight  Vital Signs: Temp: 97.3 F (36.3 C) (11/20 0944) Temp Source: Oral (11/20 0944) BP: 102/77 mmHg (11/20 0944) Pulse Rate: 55 (11/20 0944)  Labs:  Recent Labs  11/11/14 1225 11/11/14 1624 11/11/14 1812 11/12/14 0149 11/12/14 0546 11/12/14 0740 11/12/14 1050  HGB 10.7*  --   --   --  10.1*  --   --   HCT 34.2*  --   --   --  32.8*  --   --   PLT 249  --   --   --  219  --   --   APTT  --  35  --   --   --   --   --   LABPROT  --  16.3*  --   --   --   --   --   INR  --  1.30  --   --   --   --   --   HEPARINUNFRC  --   --   --  0.26*  --   --  0.22*  CREATININE 0.93 1.01  --   --  1.01  --   --   TROPONINI  --   --  <0.30  --   --  <0.30  --    Estimated Creatinine Clearance: 47.4 mL/min (by C-G formula based on Cr of 1.01). Infusions:  . amiodarone 60 mg/hr (11/12/14 0913)   Followed by  . amiodarone    . heparin 1,350 Units/hr (11/12/14 0313)    Assessment: 88 yoM to ED with [facility] and hypoxemia, hx of cardiomyopathy, pacemaker; found in Afib. Elevated D-dimer noted, plan chest CT to r/o PE.   Heparin ordered by Cardiology for AFib  Serial troponins ordered - neg x 2  CTA negative for PE  11/20:  Heparin level = 0.22 on 1350 units/hr (heparin level down from previous level despite increase in rate, 1st level may have been effected by bolus)  No issues with infusion per RN, running at expected rate  CBC - Hgb = 10.1 low/stable, pltc WNL  Per cards, not candidate for long-term anticoagulation d/t dementia  On amiodarone gtt  Goal of Therapy:  Heparin level 0.3-0.7  units/ml Monitor platelets by anticoagulation protocol: Yes   Plan:   Increase heparin rate to 1500 units/hr  Heparin level in 8hrs  Daily heparin level and CBC  Follow-up CTA results - no PE  Juliette Alcide, PharmD, BCPS.   Pager: [phone] 11/12/2014, 11:35 AM

## 2014-11-12 NOTE — Progress Notes (Signed)
ANTICOAGULATION CONSULT NOTE - follow-up  Pharmacy Consult for Heparin Indication: atrial fibrillation  Allergies  Allergen Reactions  . Morphine Other (See Comments)    Per MAR  . Wellbutrin [Bupropion] Other (See Comments)    Per MAR    Patient Measurements: Height: 5\' 6"  (167.6 cm) Weight: 142 lb 10.2 oz (64.7 kg) IBW/kg (Calculated) : 63.8 Heparin Dosing Weight: actual weight  Vital Signs: Temp: 98.1 F (36.7 C) (11/20 1322) Temp Source: Oral (11/20 1322) BP: 97/67 mmHg (11/20 1322) Pulse Rate: 87 (11/20 1322)  Labs:  Recent Labs  11/11/14 1225 11/11/14 1624  11/12/14 0149 11/12/14 0546 11/12/14 0740 11/12/14 1050 11/12/14 1340 11/12/14 1945 11/12/14 2000  HGB 10.7*  --   --   --  10.1*  --   --   --   --   --   HCT 34.2*  --   --   --  32.8*  --   --   --   --   --   PLT 249  --   --   --  219  --   --   --   --   --   APTT  --  35  --   --   --   --   --   --   --   --   LABPROT  --  16.3*  --   --   --   --   --   --   --   --   INR  --  1.30  --   --   --   --   --   --   --   --   HEPARINUNFRC  --   --   --  0.26*  --   --  0.22*  --   --  0.54  CREATININE 0.93 1.01  --   --  1.01  --   --   --   --   --   TROPONINI  --   --   < >  --   --  <0.30  --  <0.30 <0.30  --   < > = values in this interval not displayed. Estimated Creatinine Clearance: 47.4 mL/min (by C-G formula based on Cr of 1.01). Infusions:  . amiodarone 30 mg/hr (11/12/14 2046)  . heparin 1,500 Units/hr (11/12/14 1328)   Assessment: 88 yoM to ED with [facility] and hypoxemia, hx of cardiomyopathy, pacemaker; found in Afib. Elevated D-dimer noted, plan chest CT to r/o PE.   Heparin ordered by Cardiology for AFib  Serial troponins ordered - neg x 2  CT negative for PE  11/20:  Heparin level = 0.22 on 1350 units/hr (heparin level down from previous level despite increase in rate, 1st level may have been effected by bolus)  No issues with infusion per RN, running at expected rate  CBC  - Hgb = 10.1 low/stable, pltc WNL  Per cards, not candidate for long-term anticoagulation d/t dementia  On amiodarone gtt  Follow up Heparin level after rate increase to 1500 units/hr = 0.54, in goal range  Goal of Therapy:  Heparin level 0.3-0.7 units/ml Monitor platelets by anticoagulation protocol: Yes   Plan:   Continue Heparin infusion at 1500 units/hr  Daily heparin level and CBC  Otho Bellows PharmD Pager [phone] 11/12/2014, 9:44 PM

## 2014-11-12 NOTE — Progress Notes (Signed)
Echocardiogram 2D Echocardiogram has been performed.  Kevin [name] 11/12/2014, 11:44 AM

## 2014-11-12 NOTE — Progress Notes (Signed)
Pharmacist Heart Failure Core Measure Documentation  Assessment: Kevin [name] has an EF documented as 20-25% on 11/12/2014 by ECHO  Rationale: Heart failure patients with left ventricular systolic dysfunction (LVSD) and an EF < 40% should be prescribed an angiotensin converting enzyme inhibitor (ACEI) or angiotensin receptor blocker (ARB) at discharge unless a contraindication is documented in the medical record.  This patient is not currently on an ACEI or ARB for HF.  This note is being placed in the record in order to provide documentation that a contraindication to the use of these agents is present for this encounter.  ACE Inhibitor or Angiotensin Receptor Blocker is contraindicated (specify all that apply)  []   ACEI allergy AND ARB allergy []   Angioedema []   Moderate or severe aortic stenosis []   Hyperkalemia [x]   Hypotension []   Renal artery stenosis []   Worsening renal function, preexisting renal disease or dysfunction   Dannielle Huh 11/12/2014 2:12 PM

## 2014-11-12 NOTE — Progress Notes (Signed)
ANTICOAGULATION CONSULT NOTE - F/u Consult  Pharmacy Consult for Heparin Indication: atrial fibrillation  Allergies  Allergen Reactions  . Morphine Other (See Comments)    Per MAR  . Wellbutrin [Bupropion] Other (See Comments)    Per [facility]    Patient Measurements: Height: 5\' 6"  (167.6 cm) Weight: 146 lb 6.2 oz (66.4 kg) IBW/kg (Calculated) : 63.8 Heparin Dosing Weight: actual weight  Vital Signs: Temp: 97.3 F (36.3 C) (11/19 2117) Temp Source: Axillary (11/19 2117) BP: 104/78 mmHg (11/19 2117) Pulse Rate: 77 (11/19 2117)  Labs:  Recent Labs  11/11/14 1225 11/11/14 1624 11/11/14 1812 11/12/14 0149  HGB 10.7*  --   --   --   HCT 34.2*  --   --   --   PLT 249  --   --   --   APTT  --  35  --   --   LABPROT  --  16.3*  --   --   INR  --  1.30  --   --   HEPARINUNFRC  --   --   --  0.26*  CREATININE 0.93 1.01  --   --   TROPONINI  --   --  <0.30  --    Estimated Creatinine Clearance: 47.4 mL/min (by C-G formula based on Cr of 1.01).  Medical History: Past Medical History  Diagnosis Date  . UTI (lower urinary tract infection)   . Cardiomyopathy   . Insomnia   . Obesity   . AICD (automatic cardioverter/defibrillator) present   . Hypertension   . Vitamin D deficiency   . Sleep apnea     no cpap  . Dementia   . Depressive disorder    Medications:  Scheduled:  . calcium-vitamin D  1 tablet Oral Daily  . furosemide  20 mg Intravenous Daily  . iron polysaccharides  150 mg Oral Q lunch  . polyethylene glycol  17 g Oral Daily  . senna  2 tablet Oral Daily  . sodium chloride  3 mL Intravenous Q12H  . tamsulosin  0.4 mg Oral QPC supper   Infusions:  . heparin      Assessment: 88 yoM to ED with [facility] and hypoxemia, hx of cardiomyopathy, pacemaker; found in Afib. Elevated D-dimer noted, plan chest CT to r/o PE.   Heparin ordered by Cardiology for AFib  Serial troponins ordered 11/20  1st HL=0.26, no problems per RN  Goal of Therapy:  Heparin level  0.3-0.7 units/ml Monitor platelets by anticoagulation protocol: Yes   Plan:   Increase heparin drip to 1350 units/hr  Recheck HL in 8 hrs  Daily heparin level and CBC   Lorenza Evangelist Pager [phone] 11/12/2014, 3:12 AM

## 2014-11-12 NOTE — Care Management Note (Addendum)
    Page 1 of 1   11/15/2014     4:59:17 PM CARE MANAGEMENT NOTE 11/15/2014  Patient:  Kevin [name], Kevin [name]   Account Number:  [id number]  Date Initiated:  11/12/2014  Documentation initiated by:  Lanier Clam  Subjective/Objective Assessment:   80 Y/O M ADMITTED W/CHF.     Action/Plan:   FROM SNF-[facility].   Anticipated DC Date:  11/16/2014   Anticipated DC Plan:  SKILLED NURSING FACILITY      DC Planning Services  CM consult      Choice offered to / List presented to:             Status of service:  In process, will continue to follow Medicare Important Message given?  YES (If response is "NO", the following Medicare IM given date fields will be blank) Date Medicare IM given:  11/12/2014 Medicare IM given by:  [facility] Date Additional Medicare IM given:  11/15/2014 Additional Medicare IM given by:  [facility]  Discharge Disposition:    Per UR Regulation:  Reviewed for med. necessity/level of care/duration of stay  If discussed at Long Length of Stay Meetings, dates discussed:    Comments:  11/15/14 Javid Kemler RN,BSN NCM [phone] DI[address]ING.D/C PLAN SNF.  11/12/14 Aamya Orellana RN,BSN NCM [phone] D/C PLAN SNF.

## 2014-11-12 NOTE — Progress Notes (Addendum)
     SUBJECTIVE: Pt is eating breakfast. He is very angry that he does not have a diaper here. He tells me that he "needs to be changed like a baby". He says "everything is wrong, everything is terrible". No chest pain. Breathing is better.   BP 114/88 mmHg  Pulse 56  Temp(Src) 97.5 F (36.4 C) (Axillary)  Resp 24  Ht 5\' 6"  (1.676 m)  Wt 142 lb 10.2 oz (64.7 kg)  BMI 23.03 kg/m2  SpO2 98%  Intake/Output Summary (Last 24 hours) at 11/12/14 0748 Last data filed at 11/12/14 0600  Gross per 24 hour  Intake 100.18 ml  Output      0 ml  Net 100.18 ml    PHYSICAL EXAM General: Well developed, well nourished, in no acute distress. Alert and oriented x 3.  Psych:  Good affect, responds appropriately Neck: No JVD. No masses noted.  Lungs: Clear bilaterally with no wheezes or rhonci noted.  Heart: irreg tachy with no murmurs noted. Abdomen: Bowel sounds are present. Soft, non-tender.  Extremities: No lower extremity edema.   LABS: Basic Metabolic Panel:  Recent Labs  84/21/03 1624 11/12/14 0546  NA 139 138  K 4.4 4.6  CL 103 104  CO2 21 21  GLUCOSE 170* 168*  BUN 20 25*  CREATININE 1.01 1.01  CALCIUM 9.4 9.5  MG 2.6*  --    CBC:  Recent Labs  11/11/14 1225 11/12/14 0546  WBC 6.3 3.3*  HGB 10.7* 10.1*  HCT 34.2* 32.8*  MCV 82.2 81.4  PLT 249 219   Cardiac Enzymes:  Recent Labs  11/11/14 1812  TROPONINI <0.30    Current Meds: . calcium-vitamin D  1 tablet Oral Daily  . furosemide  20 mg Intravenous Daily  . iron polysaccharides  150 mg Oral Q lunch  . polyethylene glycol  17 g Oral Daily  . senna  2 tablet Oral Daily  . sodium chloride  3 mL Intravenous Q12H  . tamsulosin  0.4 mg Oral QPC supper    ASSESSMENT AND PLAN:  1. Congestive heart failure, type to be determined:  He has been known to have normal LV function by echo 2012, normal coronaries by cath in 2005. Echo pending today. Continue IV Lasix today. Will increase Lasix to 40 mg IV Qdaily  today for better diuresis.    2. New onset of atrial fibrillation: ? Duration. Continue IV heparin. Rate not controlled this am. Metoprolol has been held due to hypotension. Will start IV amiodarone today. He is not be a good candidate for long term anti-coagulation given dementia.   3. Dementia  4. Elevated d-dimer: 8.02: CT angiogram chest pending today to rule out pulmonary emboli. The patient has not been on any anticoagulation and has been at prolonged bed rest at the nursing home  Bethani Brugger  11/20/20157:48 AM

## 2014-11-12 NOTE — Progress Notes (Signed)
  Clinical Social Work Department BRIEF PSYCHOSOCIAL ASSESSMENT 11/12/2014  Patient:  Kevin [name], Kevin [name]     Account Number:  [id number]     Admit date:  11/11/2014  Clinical Social Worker:  Orpah Greek  Date/Time:  11/12/2014 11:26 AM  Referred by:  Physician  Date Referred:  11/12/2014 Referred for  Other - See comment   Other Referral:   Admitted from: [facility]   Interview type:  Patient Other interview type:   and daughter, Kevin [name] at bedside    PSYCHOSOCIAL DATA Living Status:  FACILITY Admitted from facility:  [facility], [place] Level of care:  Skilled Nursing Facility Primary support name:  Kevin [name] (daughter) ph#: [phone] Primary support relationship to patient:  CHILD, ADULT Degree of support available:   good    CURRENT CONCERNS Current Concerns  Post-Acute Placement   Other Concerns:    SOCIAL WORK ASSESSMENT / PLAN CSW received consult that patient was admitted from [facility].   Assessment/plan status:  Information/Referral to [facility] Other assessment/ plan:   Information/referral to community resources:   CSW completed FL2 and faxed information to [facility].    PATIENT'S/FAMILY'S RESPONSE TO PLAN OF CARE: CSW confirmed with patient & daughter, Kevin [name] that patient will return to [facility] at discharge. CSW confirmed with Kevin [name] @ SNF that they would be able to take patient back when ready.       Kevin Maxin, LCSW [place] Clinical Social Worker cell #: [phone]

## 2014-11-12 NOTE — Clinical Documentation Improvement (Signed)
This is an 80 year old male who presented from SNF with worsening shortness of breath for past few days. CXR showed cardiomegaly, pulmonary vascular congestion consistent with CHF. The patient has a known medical history of Hypertension. Please help, if possible, by clarifying in your progress note if the patients HTN and CHF have any known cause and effect relationship between the HTN and CHF.    Marland Kitchen Hypertensive Heart Disease with Acute Diastolic CHF . A Diastolic CHF no known cause . Other (please specify)    Thank you for your time with this!   Servando Snare, RN Clinical Documentation Improvement Specialist ([phone] / [phone]

## 2014-11-13 DIAGNOSIS — I5041 Acute combined systolic (congestive) and diastolic (congestive) heart failure: Principal | ICD-10-CM

## 2014-11-13 DIAGNOSIS — Z95810 Presence of automatic (implantable) cardiac defibrillator: Secondary | ICD-10-CM

## 2014-11-13 LAB — CBC
HCT: 31.7 % — ABNORMAL LOW (ref 39.0–52.0)
HEMOGLOBIN: 10.1 g/dL — AB (ref 13.0–17.0)
MCH: 25.4 pg — ABNORMAL LOW (ref 26.0–34.0)
MCHC: 31.9 g/dL (ref 30.0–36.0)
MCV: 79.8 fL (ref 78.0–100.0)
Platelets: 223 10*3/uL (ref 150–400)
RBC: 3.97 MIL/uL — AB (ref 4.22–5.81)
RDW: 17.8 % — ABNORMAL HIGH (ref 11.5–15.5)
WBC: 7.1 10*3/uL (ref 4.0–10.5)

## 2014-11-13 LAB — BASIC METABOLIC PANEL
ANION GAP: 13 (ref 5–15)
BUN: 29 mg/dL — ABNORMAL HIGH (ref 6–23)
CHLORIDE: 100 meq/L (ref 96–112)
CO2: 21 meq/L (ref 19–32)
Calcium: 9.2 mg/dL (ref 8.4–10.5)
Creatinine, Ser: 1.17 mg/dL (ref 0.50–1.35)
GFR calc Af Amer: 63 mL/min — ABNORMAL LOW (ref >90)
GFR calc non Af Amer: 55 mL/min — ABNORMAL LOW (ref >90)
GLUCOSE: 126 mg/dL — AB (ref 70–99)
Potassium: 4.3 mEq/L (ref 3.7–5.3)
SODIUM: 134 meq/L — AB (ref 137–147)

## 2014-11-13 LAB — HEPARIN LEVEL (UNFRACTIONATED)
HEPARIN UNFRACTIONATED: 0.7 [IU]/mL (ref 0.30–0.70)
Heparin Unfractionated: 0.7 IU/mL (ref 0.30–0.70)

## 2014-11-13 NOTE — Progress Notes (Signed)
ANTICOAGULATION CONSULT NOTE - follow-up  Pharmacy Consult for Heparin Indication: atrial fibrillation  Allergies  Allergen Reactions  . Morphine Other (See Comments)    Per MAR  . Wellbutrin [Bupropion] Other (See Comments)    Per MAR    Patient Measurements: Height: 5\' 6"  (167.6 cm) Weight: 143 lb 15.4 oz (65.3 kg) IBW/kg (Calculated) : 63.8   Vital Signs: Temp: 97.5 F (36.4 C) (11/21 0629) Temp Source: Oral (11/21 0629) BP: 105/83 mmHg (11/21 0629) Pulse Rate: 93 (11/21 0629)  Labs:  Recent Labs  11/11/14 1225 11/11/14 1624  11/12/14 0546 11/12/14 0740 11/12/14 1050 11/12/14 1340 11/12/14 1945 11/12/14 2000 11/13/14 0340  HGB 10.7*  --   --  10.1*  --   --   --   --   --  10.1*  HCT 34.2*  --   --  32.8*  --   --   --   --   --  31.7*  PLT 249  --   --  219  --   --   --   --   --  223  APTT  --  35  --   --   --   --   --   --   --   --   LABPROT  --  16.3*  --   --   --   --   --   --   --   --   INR  --  1.30  --   --   --   --   --   --   --   --   HEPARINUNFRC  --   --   < >  --   --  0.22*  --   --  0.54 0.70  CREATININE 0.93 1.01  --  1.01  --   --   --   --   --  1.17  TROPONINI  --   --   < >  --  <0.30  --  <0.30 <0.30  --   --   < > = values in this interval not displayed. Estimated Creatinine Clearance: 40.9 mL/min (by C-G formula based on Cr of 1.17). Infusions:  . amiodarone 30 mg/hr (11/12/14 2046)  . heparin 1,500 Units/hr (11/12/14 1328)   Assessment: 88 yoM to ED with [facility] and hypoxemia, hx of cardiomyopathy, pacemaker; found to be in Afib.   Heparin ordered by Cardiology for AFib  Serial troponins ordered - neg   CT: no evidence of PE  Today, 11/21:  Heparin level therapeutic (0.7), although has trended to upper end of range.  Hgb stable. Pltc WNL.  No bleeding reported in latest chart notes.  Per cards, not candidate for long-term anticoagulation d/t dementia   Goal of Therapy:  Heparin level 0.3-0.7 units/ml Monitor  platelets by anticoagulation protocol: Yes   Plan:  1. Continue Heparin infusion at 1500 units/hr. 2. Recheck heparin level at 6pm given upward trend. 3. Daily heparin level and CBC.  Elie Goody, PharmD, BCPS Pager: [phone] 11/13/2014  7:06 AM

## 2014-11-13 NOTE — Progress Notes (Signed)
[facility] PROGRESS NOTE  Kevin [name] QMG:[id number] DOB: 1925-08-25 DOA: 11/11/2014 PCP: Londell Moh, MD  Assessment/Plan: #1 acute systolic CHF exacerbation Likely secondary to afib. Some clinical improvement. Cardiac enzymes negative 2. 2-D echo with EF of 20-25% with dyskinesis of the entire anteroseptal myocardium which is different from prior 2-D echo. Continue diuretics. Patient with borderline blood pressure and a such beta blocker has been held. Unable to place on ACE inhibitor secondary to borderline blood pressure issues. Cardiology following and appreciate input and recommendations.  #2 new onset atrial fibrillation Likely secondary to loss of BIV pacing. Cardiac enzymes negative 2. 2-D echo with depressed EF of 20-25% with dyskinesis of the entire anteroseptal myocardium which is new from prior 2-D echo. Patient's metoprolol has been held secondary to hypotension. Patient has been started on IV amiodarone per cardiology and dose increased. Due to patient's dementia is felt not to be a candidate for long-term anticoagulation. Cardiology following and appreciate input and recommendations.  #3 dementia  #4 elevated d-dimer CT angiogram negative for large or central PE. Moderate sized bilateral pleural effusions with adjacent compressive atelectasis is noted.  #5 BPH Continue Flomax.  #6 hypertension Blood pressure medications on hold secondary to hypotension.  #7 prophylaxis Heparin for DVT prophylaxis.  Code Status: DO NOT RESUSCITATE Family Communication: Updated patient of family at bedside. Disposition Plan: Home when medically stable.   Consultants:  Cardiology: Dr. Patty Sermons 11/11/2014  Procedures:  2-D echo 11/12/2014  CT angiogram chest 11/12/2014  Chest x-ray 11/11/2014  Antibiotics:  None  HPI/Subjective: Patient states SOB improved. No CP.  Objective: Filed Vitals:   11/13/14 0629  BP: 105/83  Pulse: 93  Temp: 97.5 F  (36.4 C)  Resp: 20    Intake/Output Summary (Last 24 hours) at 11/13/14 0950 Last data filed at 11/13/14 3267  Gross per 24 hour  Intake    360 ml  Output    425 ml  Net    -65 ml   Filed Weights   11/11/14 1500 11/12/14 0500 11/13/14 0629  Weight: 66.4 kg (146 lb 6.2 oz) 64.7 kg (142 lb 10.2 oz) 65.3 kg (143 lb 15.4 oz)    Exam:   General:  NAD  Cardiovascular: Irregularly irregular  Respiratory: Bibasilar crackles.  Abdomen: , Nontender, nondistended, positive bowel sounds.  Musculoskeletal: No clubbing cyanosis or edema.  Data Reviewed: Basic Metabolic Panel:  Recent Labs Lab 11/11/14 1225 11/11/14 1624 11/12/14 0546 11/13/14 0340  NA 143 139 138 134*  K 5.2 4.4 4.6 4.3  CL 107 103 104 100  CO2 22 21 21 21   GLUCOSE 171* 170* 168* 126*  BUN 20 20 25* 29*  CREATININE 0.93 1.01 1.01 1.17  CALCIUM 9.6 9.4 9.5 9.2  MG  --  2.6*  --   --    Liver Function Tests:  Recent Labs Lab 11/11/14 1624  AST 21  ALT 15  ALKPHOS 110  BILITOT 0.4  PROT 7.0  ALBUMIN 3.0*   No results for input(s): LIPASE, AMYLASE in the last 168 hours. No results for input(s): AMMONIA in the last 168 hours. CBC:  Recent Labs Lab 11/11/14 1225 11/12/14 0546 11/13/14 0340  WBC 6.3 3.3* 7.1  HGB 10.7* 10.1* 10.1*  HCT 34.2* 32.8* 31.7*  MCV 82.2 81.4 79.8  PLT 249 219 223   Cardiac Enzymes:  Recent Labs Lab 11/11/14 1812 11/12/14 0740 11/12/14 1340 11/12/14 1945  TROPONINI <0.30 <0.30 <0.30 <0.30   BNP (last 3 results)  Recent Labs  11/11/14 1252 11/11/14 1624  PROBNP 15640.0* 15842.0*   CBG: No results for input(s): GLUCAP in the last 168 hours.  Recent Results (from the past 240 hour(s))  MRSA PCR Screening     Status: Abnormal   Collection Time: 11/11/14  5:46 PM  Result Value Ref Range Status   MRSA by PCR POSITIVE (A) NEGATIVE Final    Comment:        The GeneXpert MRSA Assay (FDA approved for NASAL specimens only), is one component of  a comprehensive MRSA colonization surveillance program. It is not intended to diagnose MRSA infection nor to guide or monitor treatment for MRSA infections. RESULT CALLED TO, READ BACK BY AND VERIFIED WITH: L.REINERT,RN AT 2207 ON 11/11/14 BY SHEAW      Studies: Dg Chest 2 View (if Patient Has Fever And/or Copd)  11/11/2014   CLINICAL DATA:  Shortness of breath, history of Alzheimer's disease  EXAM: CHEST  2 VIEW  COMPARISON:  Portable chest x-ray of 12/27/2010  FINDINGS: There is moderate cardiomegaly present with pulmonary vascular congestion and probable small effusions most consistent with congestive heart failure. An AICD lead remains. There are degenerative changes noted in both shoulders.  IMPRESSION: Cardiomegaly, pulmonary vascular congestion, and effusions most consistent with CHF.   Electronically Signed   By: Dwyane DeePaul  Barry M.D.   On: 11/11/2014 13:10   Ct Angio Chest Pe W/cm &/or Wo Cm  11/12/2014   CLINICAL DATA:  History of dementia and atrial fibrillation. Worsening shortness of breath. Productive cough.  EXAM: CT ANGIOGRAPHY CHEST WITH CONTRAST  TECHNIQUE: Multidetector CT imaging of the chest was performed using the standard protocol during bolus administration of intravenous contrast. Multiplanar CT image reconstructions and MIPs were obtained to evaluate the vascular anatomy.  CONTRAST:  100mL OMNIPAQUE IOHEXOL 350 MG/ML SOLN  COMPARISON:  Chest radiograph 11/11/2014  FINDINGS: The contrast bolus is adequate but there is motion artifact throughout the examination and difficult to evaluate for pulmonary emboli in the distal vessels, particularly in the left lower lobe. There is no significant pulmonary arterial contrast in the left lower lobe. There is no evidence for a large central or lobar pulmonary embolus. Main pulmonary artery is prominent measuring up to 3.5 cm and the adjacent aorta measures 3.8 cm. Heart is prominent for size. Contrast refluxes into the IVC and hepatic  veins and likely represent underlying cardiac dysfunction. Multiple low-density structures associated with the upper kidneys are incompletely evaluated but probably represent cysts. There is a moderate-sized hiatal hernia. There is a left cardiac ICD.  Moderate bilateral pleural effusions. No significant chest lymphadenopathy. Atherosclerotic calcifications in the aortic arch and descending thoracic aorta.  The trachea and mainstem bronchi are patent. Compressive atelectasis in lower lobes bilaterally. Patchy ground-glass disease in the right upper lung. There is mild ground-glass disease in the left upper lung. No acute bone abnormality.  Review of the MIP images confirms the above findings.  IMPRESSION: No evidence for a large or central pulmonary embolism. This is a limited examination due to extensive motion artifact and poor opacification of the pulmonary arteries in the lower lobes. In particular, difficult to evaluate for pulmonary emboli in the left lower lobe.  Moderate sized bilateral pleural effusions with adjacent compressive atelectasis.  Scattered patchy airspace densities, particularly the right upper lung. Differential would include asymmetric edema versus subtle infection.  Probable bilateral renal cysts.  Large amount of contrast within the IVC and hepatic veins. Findings suggest underlying cardiac dysfunction.   Electronically Signed  By: Richarda Overlie M.D.   On: 11/12/2014 12:03    Scheduled Meds: . calcium-vitamin D  1 tablet Oral Daily  . furosemide  40 mg Intravenous Daily  . iron polysaccharides  150 mg Oral Q lunch  . polyethylene glycol  17 g Oral Daily  . senna  2 tablet Oral Daily  . sodium chloride  3 mL Intravenous Q12H  . tamsulosin  0.4 mg Oral QPC supper   Continuous Infusions: . amiodarone 30 mg/hr (11/12/14 2046)  . heparin 1,500 Units/hr (11/13/14 0846)    Principal Problem:   Acute diastolic CHF (congestive heart failure) Active Problems:   BPH (benign prostatic  hypertrophy) with urinary retention   Constipation   Alzheimer's dementia   Atrial fibrillation   Acute systolic congestive heart failure    Time spent: 35 minutes    Huey Scalia M.D. [facility] Pager [phone]. If 7PM-7AM, please contact night-coverage at [email or web address], password [facility] 11/13/2014, 9:50 AM  LOS: 2 days

## 2014-11-13 NOTE — Progress Notes (Signed)
Rx Brief Anticoagulation note:  IV Heparin 88 yoM on IV heparin for A-fib.  Assessement:  HL=0.7 units/ml on 1500 units/hr  No problems reported per RN  Plan;  Continue at 1500 units/hr  Recheck with am labs  Lorenza Evangelist 11/13/2014 7:08 PM

## 2014-11-13 NOTE — Progress Notes (Signed)
Full device interrogation performed. Current device is a Engineer, civil (consulting) CRT-P (not ICD, downgrade in 2011) and has >4 years battery longevity. Not capturing in current LV lead vector (bipolar), so switched to LV tip-RV ring. Undersensing the AF (therfeore uncertain when it started) and atrial sensitivity adjusted. Switched to VVIR and triggered pacing activated.  Thurmon Fair, MD, [facility] [phone] office [phone] pager

## 2014-11-13 NOTE — Progress Notes (Signed)
Patient's condom cath came off after administration of lasix.  He voided a large amount of urine that was unmeasured.

## 2014-11-13 NOTE — Progress Notes (Signed)
Patient Name: Kevin [name] Date of Encounter: 11/13/2014  Principal Problem:   Acute systolic and diastolic CHF (congestive heart failure) Active Problems:   BPH (benign prostatic hypertrophy) with urinary retention   Constipation   Alzheimer's dementia   Atrial fibrillation with RVR   Acute systolic congestive heart failure   Length of Stay: 2  SUBJECTIVE  Emotional, but appears fairly comfortable lying flat Remains in AF with RVR. EF is severely depressed 20-25%. No coronary Ca seen on chest CT. ECG nondiagnostic due to IVCD. He has a biventricular pacemaker/ICD implanted 2007 that was followed by Dr. Alanda Amass, but I don't think it has been evaluated since Dr. Lacretia Nicks retirement. ([address] [employer] ATF model number V/343 - implanted 2007 )  Note   CURRENT MEDS . calcium-vitamin D  1 tablet Oral Daily  . furosemide  40 mg Intravenous Daily  . iron polysaccharides  150 mg Oral Q lunch  . polyethylene glycol  17 g Oral Daily  . senna  2 tablet Oral Daily  . sodium chloride  3 mL Intravenous Q12H  . tamsulosin  0.4 mg Oral QPC supper    OBJECTIVE   Intake/Output Summary (Last 24 hours) at 11/13/14 1119 Last data filed at 11/13/14 1047  Gross per 24 hour  Intake    600 ml  Output    425 ml  Net    175 ml   Filed Weights   11/11/14 1500 11/12/14 0500 11/13/14 0629  Weight: 146 lb 6.2 oz (66.4 kg) 142 lb 10.2 oz (64.7 kg) 143 lb 15.4 oz (65.3 kg)    PHYSICAL EXAM Filed Vitals:   11/12/14 1916 11/12/14 2236 11/13/14 0629 11/13/14 1000  BP:  106/64 105/83 94/55  Pulse:  99 93 105  Temp:  97.5 F (36.4 C) 97.5 F (36.4 C)   TempSrc:  Axillary Oral   Resp:  20 20   Height:      Weight:   143 lb 15.4 oz (65.3 kg)   SpO2: 100% 100% 100% 100%   General: Alert, oriented x3, no distress Head: no evidence of trauma, PERRL, EOMI, no exophtalmos or lid lag, no myxedema, no xanthelasma; normal ears, nose and oropharynx Neck: normal jugular venous pulsations and no  hepatojugular reflux; brisk carotid pulses without delay and no carotid bruits Chest: clear to auscultation, no signs of consolidation by percussion or palpation, normal fremitus, symmetrical and full respiratory excursions Cardiovascular: normal position and quality of the apical impulse, regular rhythm, normal first and second heart sounds, no rubs or gallops, 2/6 holosystolic murmur Abdomen: no tenderness or distention, no masses by palpation, no abnormal pulsatility or arterial bruits, normal bowel sounds, no hepatosplenomegaly Extremities: no clubbing, cyanosis or edema; 2+ radial, ulnar and brachial pulses bilaterally; 2+ right femoral, posterior tibial and dorsalis pedis pulses; 2+ left femoral, posterior tibial and dorsalis pedis pulses; no subclavian or femoral bruits Neurological: grossly nonfocal  LABS  CBC  Recent Labs  11/12/14 0546 11/13/14 0340  WBC 3.3* 7.1  HGB 10.1* 10.1*  HCT 32.8* 31.7*  MCV 81.4 79.8  PLT 219 223   Basic Metabolic Panel  Recent Labs  11/11/14 1624 11/12/14 0546 11/13/14 0340  NA 139 138 134*  K 4.4 4.6 4.3  CL 103 104 100  CO2 21 21 21   GLUCOSE 170* 168* 126*  BUN 20 25* 29*  CREATININE 1.01 1.01 1.17  CALCIUM 9.4 9.5 9.2  MG 2.6*  --   --    Liver Function Tests  Recent Labs  11/11/14 1624  AST 21  ALT 15  ALKPHOS 110  BILITOT 0.4  PROT 7.0  ALBUMIN 3.0*   No results for input(s): LIPASE, AMYLASE in the last 72 hours. Cardiac Enzymes  Recent Labs  11/12/14 0740 11/12/14 1340 11/12/14 1945  TROPONINI <0.30 <0.30 <0.30   BNP Invalid input(s): POCBNP D-Dimer No results for input(s): DDIMER in the last 72 hours. Hemoglobin A1C No results for input(s): HGBA1C in the last 72 hours. Fasting Lipid Panel No results for input(s): CHOL, HDL, LDLCALC, TRIG, CHOLHDL, LDLDIRECT in the last 72 hours. Thyroid Function Tests No results for input(s): TSH, T4TOTAL, T3FREE, THYROIDAB in the last 72 hours.  Invalid input(s):  FREET3  Radiology Studies Imaging results have been reviewed and Dg Chest 2 View (if Patient Has Fever And/or Copd)  11/11/2014   CLINICAL DATA:  Shortness of breath, history of Alzheimer's disease  EXAM: CHEST  2 VIEW  COMPARISON:  Portable chest x-ray of 12/27/2010  FINDINGS: There is moderate cardiomegaly present with pulmonary vascular congestion and probable small effusions most consistent with congestive heart failure. An AICD lead remains. There are degenerative changes noted in both shoulders.  IMPRESSION: Cardiomegaly, pulmonary vascular congestion, and effusions most consistent with CHF.   Electronically Signed   By: Dwyane DeePaul  Barry M.D.   On: 11/11/2014 13:10   Ct Angio Chest Pe W/cm &/or Wo Cm  11/12/2014   CLINICAL DATA:  History of dementia and atrial fibrillation. Worsening shortness of breath. Productive cough.  EXAM: CT ANGIOGRAPHY CHEST WITH CONTRAST  TECHNIQUE: Multidetector CT imaging of the chest was performed using the standard protocol during bolus administration of intravenous contrast. Multiplanar CT image reconstructions and MIPs were obtained to evaluate the vascular anatomy.  CONTRAST:  100mL OMNIPAQUE IOHEXOL 350 MG/ML SOLN  COMPARISON:  Chest radiograph 11/11/2014  FINDINGS: The contrast bolus is adequate but there is motion artifact throughout the examination and difficult to evaluate for pulmonary emboli in the distal vessels, particularly in the left lower lobe. There is no significant pulmonary arterial contrast in the left lower lobe. There is no evidence for a large central or lobar pulmonary embolus. Main pulmonary artery is prominent measuring up to 3.5 cm and the adjacent aorta measures 3.8 cm. Heart is prominent for size. Contrast refluxes into the IVC and hepatic veins and likely represent underlying cardiac dysfunction. Multiple low-density structures associated with the upper kidneys are incompletely evaluated but probably represent cysts. There is a moderate-sized  hiatal hernia. There is a left cardiac ICD.  Moderate bilateral pleural effusions. No significant chest lymphadenopathy. Atherosclerotic calcifications in the aortic arch and descending thoracic aorta.  The trachea and mainstem bronchi are patent. Compressive atelectasis in lower lobes bilaterally. Patchy ground-glass disease in the right upper lung. There is mild ground-glass disease in the left upper lung. No acute bone abnormality.  Review of the MIP images confirms the above findings.  IMPRESSION: No evidence for a large or central pulmonary embolism. This is a limited examination due to extensive motion artifact and poor opacification of the pulmonary arteries in the lower lobes. In particular, difficult to evaluate for pulmonary emboli in the left lower lobe.  Moderate sized bilateral pleural effusions with adjacent compressive atelectasis.  Scattered patchy airspace densities, particularly the right upper lung. Differential would include asymmetric edema versus subtle infection.  Probable bilateral renal cysts.  Large amount of contrast within the IVC and hepatic veins. Findings suggest underlying cardiac dysfunction.   Electronically Signed   By: Madelaine BhatAdam  Lowella Dandy M.D.   On: 11/12/2014 12:03    TELE AF with RVR  ECG AF with RVR, LBBB  ASSESSMENT AND PLAN   Suspect atrial fibrillation is cause of decompensation due to loss of BIV pacing. Device is 78 years old and may also be approaching end of battery service. Diuretics Rate control - increase amio back to 1 mg/h for 6h Anticoagulation ACEi will be difficult to implement due to low BP. Will add very low dose beta blocker for better rate control. Consider digoxin.  Dementia diagnosis will have an impact on how aggressive (TEE guided cardioversion, antiarrhythmics, device changeout if necessary) or conservative (rate control, diuretics) our therapy is.   Thurmon Fair, MD, [facility] [phone] office [phone]  pager 11/13/2014 11:19 AM

## 2014-11-14 DIAGNOSIS — Z95810 Presence of automatic (implantable) cardiac defibrillator: Secondary | ICD-10-CM

## 2014-11-14 LAB — BASIC METABOLIC PANEL
Anion gap: 10 (ref 5–15)
BUN: 26 mg/dL — ABNORMAL HIGH (ref 6–23)
CHLORIDE: 101 meq/L (ref 96–112)
CO2: 25 mEq/L (ref 19–32)
Calcium: 8.8 mg/dL (ref 8.4–10.5)
Creatinine, Ser: 1.07 mg/dL (ref 0.50–1.35)
GFR calc Af Amer: 70 mL/min — ABNORMAL LOW (ref >90)
GFR, EST NON AFRICAN AMERICAN: 61 mL/min — AB (ref >90)
GLUCOSE: 112 mg/dL — AB (ref 70–99)
POTASSIUM: 3.5 meq/L — AB (ref 3.7–5.3)
SODIUM: 136 meq/L — AB (ref 137–147)

## 2014-11-14 LAB — CBC
HCT: 31.1 % — ABNORMAL LOW (ref 39.0–52.0)
Hemoglobin: 9.6 g/dL — ABNORMAL LOW (ref 13.0–17.0)
MCH: 25.1 pg — ABNORMAL LOW (ref 26.0–34.0)
MCHC: 30.9 g/dL (ref 30.0–36.0)
MCV: 81.2 fL (ref 78.0–100.0)
Platelets: 193 10*3/uL (ref 150–400)
RBC: 3.83 MIL/uL — ABNORMAL LOW (ref 4.22–5.81)
RDW: 18 % — AB (ref 11.5–15.5)
WBC: 5.3 10*3/uL (ref 4.0–10.5)

## 2014-11-14 LAB — HEPARIN LEVEL (UNFRACTIONATED): Heparin Unfractionated: 0.48 IU/mL (ref 0.30–0.70)

## 2014-11-14 MED ORDER — METOPROLOL TARTRATE 25 MG PO TABS: 12.5000 mg | ORAL_TABLET | Freq: Two times a day (BID) | ORAL | Status: DC

## 2014-11-14 MED ORDER — METOPROLOL TARTRATE 25 MG PO TABS
12.5000 mg | ORAL_TABLET | Freq: Two times a day (BID) | ORAL | Status: DC
  Administered 2014-11-14 – 2014-11-15 (×3): 12.5 mg via ORAL
  Filled 2014-11-14 (×3): qty 1

## 2014-11-14 MED ORDER — AMIODARONE HCL IN DEXTROSE 360-4.14 MG/200ML-% IV SOLN
60.0000 mg/h | INTRAVENOUS | Status: AC
  Administered 2014-11-14 (×2): 60 mg/h via INTRAVENOUS
  Filled 2014-11-14 (×2): qty 200

## 2014-11-14 MED ORDER — AMIODARONE HCL IN DEXTROSE 360-4.14 MG/200ML-% IV SOLN
30.0000 mg/h | INTRAVENOUS | Status: DC
  Administered 2014-11-14 – 2014-11-15 (×3): 30 mg/h via INTRAVENOUS
  Filled 2014-11-14 (×5): qty 200

## 2014-11-14 MED ORDER — AMIODARONE HCL IN DEXTROSE 360-4.14 MG/200ML-% IV SOLN: 60.0000 mg/h | INTRAVENOUS | Status: DC

## 2014-11-14 MED ORDER — DIGOXIN 125 MCG PO TABS
0.1250 mg | ORAL_TABLET | Freq: Every day | ORAL | Status: DC
  Administered 2014-11-14 – 2014-11-16 (×3): 0.125 mg via ORAL
  Filled 2014-11-14 (×3): qty 1

## 2014-11-14 MED ORDER — POTASSIUM CHLORIDE CRYS ER 20 MEQ PO TBCR
40.0000 meq | EXTENDED_RELEASE_TABLET | Freq: Every day | ORAL | Status: DC
  Administered 2014-11-14 – 2014-11-16 (×3): 40 meq via ORAL
  Filled 2014-11-14 (×3): qty 2

## 2014-11-14 NOTE — Plan of Care (Signed)
Problem: Problem: Cardiovascular Progression Goal: PULSES PRESENT/NOT DIMINISHED Outcome: Completed/Met Date Met:  11/14/14

## 2014-11-14 NOTE — Progress Notes (Signed)
[facility] PROGRESS NOTE  Kevin [name] YCX:[id number] DOB: 02-Feb-1925 DOA: 11/11/2014 PCP: Londell Moh, MD  Assessment/Plan: #1 acute systolic CHF exacerbation Likely secondary to afib. Some clinical improvement. Cardiac enzymes negative 2. 2-D echo with EF of 20-25% with dyskinesis of the entire anteroseptal myocardium which is different from prior 2-D echo. Continue diuretics. Unable to place on ACE inhibitor secondary to borderline blood pressure issues. Cardiology following and appreciate input and recommendations.  #2 new onset atrial fibrillation Likely secondary to loss of BIV pacing. Cardiac enzymes negative 2. 2-D echo with depressed EF of 20-25% with dyskinesis of the entire anteroseptal myocardium which is new from prior 2-D echo. Patient's metoprolol has been held secondary to hypotension. Patient has been started on IV amiodarone per cardiology and dose increased. Digoxin and low dose lopressor added per cardiology. Due to patient's dementia is felt not to be a candidate for long-term anticoagulation. Cardiology following and appreciate input and recommendations.  #3 dementia  #4 elevated d-dimer CT angiogram negative for large or central PE. Moderate sized bilateral pleural effusions with adjacent compressive atelectasis is noted.  #5 BPH Continue Flomax.  #6 hypertension Blood pressure medications on hold secondary to hypotension.  #7 prophylaxis Heparin for DVT prophylaxis.  Code Status: DO NOT RESUSCITATE Family Communication: Updated patient of family at bedside. Disposition Plan: Home when medically stable.   Consultants:  Cardiology: Dr. Patty Sermons 11/11/2014  Procedures:  2-D echo 11/12/2014  CT angiogram chest 11/12/2014  Chest x-ray 11/11/2014  Antibiotics:  None  HPI/Subjective: Patient sleeping, easily arousable. No CP. SOB improving  Objective: Filed Vitals:   11/14/14 1347  BP: 95/70  Pulse: 88  Temp: 98.7 F (37.1  C)  Resp: 18    Intake/Output Summary (Last 24 hours) at 11/14/14 1744 Last data filed at 11/14/14 1346  Gross per 24 hour  Intake 1138.9 ml  Output   2750 ml  Net -1611.1 ml   Filed Weights   11/12/14 0500 11/13/14 0629 11/14/14 0434  Weight: 64.7 kg (142 lb 10.2 oz) 65.3 kg (143 lb 15.4 oz) 66.5 kg (146 lb 9.7 oz)    Exam:   General:  NAD  Cardiovascular: Irregularly irregular  Respiratory: CTAB  Abdomen: , Nontender, nondistended, positive bowel sounds.  Musculoskeletal: No clubbing cyanosis or edema.  Data Reviewed: Basic Metabolic Panel:  Recent Labs Lab 11/11/14 1225 11/11/14 1624 11/12/14 0546 11/13/14 0340 11/14/14 0443  NA 143 139 138 134* 136*  K 5.2 4.4 4.6 4.3 3.5*  CL 107 103 104 100 101  CO2 22 21 21 21 25   GLUCOSE 171* 170* 168* 126* 112*  BUN 20 20 25* 29* 26*  CREATININE 0.93 1.01 1.01 1.17 1.07  CALCIUM 9.6 9.4 9.5 9.2 8.8  MG  --  2.6*  --   --   --    Liver Function Tests:  Recent Labs Lab 11/11/14 1624  AST 21  ALT 15  ALKPHOS 110  BILITOT 0.4  PROT 7.0  ALBUMIN 3.0*   No results for input(s): LIPASE, AMYLASE in the last 168 hours. No results for input(s): AMMONIA in the last 168 hours. CBC:  Recent Labs Lab 11/11/14 1225 11/12/14 0546 11/13/14 0340 11/14/14 0443  WBC 6.3 3.3* 7.1 5.3  HGB 10.7* 10.1* 10.1* 9.6*  HCT 34.2* 32.8* 31.7* 31.1*  MCV 82.2 81.4 79.8 81.2  PLT 249 219 223 193   Cardiac Enzymes:  Recent Labs Lab 11/11/14 1812 11/12/14 0740 11/12/14 1340 11/12/14 1945  TROPONINI <0.30 <0.30 <0.30 <0.30  BNP (last 3 results)  Recent Labs  11/11/14 1252 11/11/14 1624  PROBNP 15640.0* 15842.0*   CBG: No results for input(s): GLUCAP in the last 168 hours.  Recent Results (from the past 240 hour(s))  MRSA PCR Screening     Status: Abnormal   Collection Time: 11/11/14  5:46 PM  Result Value Ref Range Status   MRSA by PCR POSITIVE (A) NEGATIVE Final    Comment:        The GeneXpert MRSA  Assay (FDA approved for NASAL specimens only), is one component of a comprehensive MRSA colonization surveillance program. It is not intended to diagnose MRSA infection nor to guide or monitor treatment for MRSA infections. RESULT CALLED TO, READ BACK BY AND VERIFIED WITH: L.REINERT,RN AT 2207 ON 11/11/14 BY SHEAW      Studies: No results found.  Scheduled Meds: . calcium-vitamin D  1 tablet Oral Daily  . digoxin  0.125 mg Oral Daily  . furosemide  40 mg Intravenous Daily  . iron polysaccharides  150 mg Oral Q lunch  . metoprolol tartrate  12.5 mg Oral BID  . polyethylene glycol  17 g Oral Daily  . potassium chloride  40 mEq Oral Daily  . senna  2 tablet Oral Daily  . sodium chloride  3 mL Intravenous Q12H  . tamsulosin  0.4 mg Oral QPC supper   Continuous Infusions: . amiodarone 30 mg/hr (11/14/14 1739)  . heparin 1,500 Units/hr (11/14/14 0420)    Principal Problem:   Acute combined systolic and diastolic congestive heart failure, NYHA class 4 Active Problems:   BPH (benign prostatic hypertrophy) with urinary retention   Constipation   Alzheimer's dementia   Atrial fibrillation   Biventricular ICD (implantable cardioverter-defibrillator) in place    Time spent: 35 minutes    THOMPSON,DANIEL M.D. [facility] Pager [phone]. If 7PM-7AM, please contact night-coverage at [email or web address], password [email or web address] 11/14/2014, 5:44 PM  LOS: 3 days

## 2014-11-14 NOTE — Progress Notes (Signed)
ANTICOAGULATION CONSULT NOTE - follow-up  Pharmacy Consult for Heparin Indication: atrial fibrillation  Allergies  Allergen Reactions  . Morphine Other (See Comments)    Per MAR  . Wellbutrin [Bupropion] Other (See Comments)    Per [facility]    Patient Measurements: Height: 5\' 6"  (167.6 cm) Weight: 146 lb 9.7 oz (66.5 kg) IBW/kg (Calculated) : 63.8   Vital Signs: Temp: 97.4 F (36.3 C) (11/22 0434) Temp Source: Oral (11/22 0434) BP: 95/64 mmHg (11/22 0434) Pulse Rate: 98 (11/22 0434)  Labs:  Recent Labs  11/11/14 1624  11/12/14 0546 11/12/14 0740  11/12/14 1340 11/12/14 1945  11/13/14 0340 11/13/14 1813 11/14/14 0443  HGB  --   --  10.1*  --   --   --   --   --  10.1*  --  9.6*  HCT  --   --  32.8*  --   --   --   --   --  31.7*  --  31.1*  PLT  --   --  219  --   --   --   --   --  223  --  193  APTT 35  --   --   --   --   --   --   --   --   --   --   LABPROT 16.3*  --   --   --   --   --   --   --   --   --   --   INR 1.30  --   --   --   --   --   --   --   --   --   --   HEPARINUNFRC  --   < >  --   --   < >  --   --   < > 0.70 0.70 0.48  CREATININE 1.01  --  1.01  --   --   --   --   --  1.17  --  1.07  TROPONINI  --   < >  --  <0.30  --  <0.30 <0.30  --   --   --   --   < > = values in this interval not displayed. Estimated Creatinine Clearance: 44.7 mL/min (by C-G formula based on Cr of 1.07). Infusions:  . amiodarone 30 mg/hr (11/14/14 0018)  . heparin 1,500 Units/hr (11/14/14 0420)   Assessment: 88 yoM to ED with [facility] and hypoxemia, hx of cardiomyopathy, pacemaker; found to be in Afib.   Heparin ordered by Cardiology for AFib  Serial troponins ordered - neg   CT: no evidence of PE  Today, 11/21:  Heparin level therapeutic (0.48)  Hgb stable. Pltc WNL.  No bleeding reported in latest chart notes.  Per cards, not candidate for long-term anticoagulation d/t dementia   Goal of Therapy:  Heparin level 0.3-0.7 units/ml Monitor platelets by  anticoagulation protocol: Yes   Plan:   Continue Heparin infusion at 1500 units/hr.  Daily heparin level and CBC.  Arley Phenix RPh 11/14/2014, 9:46 AM Pager [phone]

## 2014-11-14 NOTE — Plan of Care (Signed)
Problem: Phase I Progression Outcomes Goal: Initial discharge plan identified Outcome: Progressing     

## 2014-11-14 NOTE — Progress Notes (Signed)
Patient Name: Kevin FishermanLloyd W Mcguirt Date of Encounter: 11/14/2014  Principal Problem:   Acute combined systolic and diastolic congestive heart failure, NYHA class 4 Active Problems:   BPH (benign prostatic hypertrophy) with urinary retention   Constipation   Alzheimer's dementia   Atrial fibrillation   Biventricular ICD (implantable cardioverter-defibrillator) in place   Length of Stay: 3  SUBJECTIVE  He has received a sedative and is sleepy. Breathing calmly, almost fully supine. Positive fluid balance.  CURRENT MEDS . calcium-vitamin D  1 tablet Oral Daily  . digoxin  0.125 mg Oral Daily  . furosemide  40 mg Intravenous Daily  . iron polysaccharides  150 mg Oral Q lunch  . metoprolol tartrate  12.5 mg Oral BID  . polyethylene glycol  17 g Oral Daily  . potassium chloride  40 mEq Oral Daily  . senna  2 tablet Oral Daily  . sodium chloride  3 mL Intravenous Q12H  . tamsulosin  0.4 mg Oral QPC supper    OBJECTIVE   Intake/Output Summary (Last 24 hours) at 11/14/14 0947 Last data filed at 11/14/14 0700  Gross per 24 hour  Intake 1967.03 ml  Output   1200 ml  Net 767.03 ml   Filed Weights   11/12/14 0500 11/13/14 0629 11/14/14 0434  Weight: 142 lb 10.2 oz (64.7 kg) 143 lb 15.4 oz (65.3 kg) 146 lb 9.7 oz (66.5 kg)    PHYSICAL EXAM Filed Vitals:   11/13/14 1345 11/13/14 1518 11/13/14 1958 11/14/14 0434  BP: 102/68  97/62 95/64  Pulse: 98  66 98  Temp: 98 F (36.7 C)  97.6 F (36.4 C) 97.4 F (36.3 C)  TempSrc: Axillary  Axillary Oral  Resp: 19  26 20   Height:      Weight:    146 lb 9.7 oz (66.5 kg)  SpO2: 99% 100% 100% 97%   General: sleepy, readily arousable Head: no evidence of trauma, PERRL, EOMI, no exophtalmos or lid lag, no myxedema, no xanthelasma; normal ears, nose and oropharynx Neck: normal jugular venous pulsations and no hepatojugular reflux; brisk carotid pulses without delay and no carotid bruits Chest: clear to auscultation, no signs of consolidation  by percussion or palpation, normal fremitus, symmetrical and full respiratory excursions Cardiovascular: normal position and quality of the apical impulse, irregular rhythm, normal first and split second heart sounds, no rubs or gallops, 2/6 holosystolic murmur Abdomen: no tenderness or distention, no masses by palpation, no abnormal pulsatility or arterial bruits, normal bowel sounds, no hepatosplenomegaly Extremities: no clubbing, cyanosis or edema; 2+ radial, ulnar and brachial pulses bilaterally; 2+ right femoral, posterior tibial and dorsalis pedis pulses; 2+ left femoral, posterior tibial and dorsalis pedis pulses; no subclavian or femoral bruits Neurological: grossly   LABS  CBC  Recent Labs  11/13/14 0340 11/14/14 0443  WBC 7.1 5.3  HGB 10.1* 9.6*  HCT 31.7* 31.1*  MCV 79.8 81.2  PLT 223 193   Basic Metabolic Panel  Recent Labs  11/11/14 1624  11/13/14 0340 11/14/14 0443  NA 139  < > 134* 136*  K 4.4  < > 4.3 3.5*  CL 103  < > 100 101  CO2 21  < > 21 25  GLUCOSE 170*  < > 126* 112*  BUN 20  < > 29* 26*  CREATININE 1.01  < > 1.17 1.07  CALCIUM 9.4  < > 9.2 8.8  MG 2.6*  --   --   --   < > = values in this  interval not displayed. Liver Function Tests  Recent Labs  11/11/14 1624  AST 21  ALT 15  ALKPHOS 110  BILITOT 0.4  PROT 7.0  ALBUMIN 3.0*   No results for input(s): LIPASE, AMYLASE in the last 72 hours. Cardiac Enzymes  Recent Labs  11/12/14 0740 11/12/14 1340 11/12/14 1945  TROPONINI <0.30 <0.30 <0.30   Radiology Studies Imaging results have been reviewed and Ct Angio Chest Pe W/cm &/or Wo Cm  11/12/2014   CLINICAL DATA:  History of dementia and atrial fibrillation. Worsening shortness of breath. Productive cough.  EXAM: CT ANGIOGRAPHY CHEST WITH CONTRAST  TECHNIQUE: Multidetector CT imaging of the chest was performed using the standard protocol during bolus administration of intravenous contrast. Multiplanar CT image reconstructions and MIPs  were obtained to evaluate the vascular anatomy.  CONTRAST:  OMNIPAQUE IOHEXOL 350 MG/ML SOLN  COMPARISON:  Chest radiograph 11/11/2014  FINDINGS: The contrast bolus is adequate but there is motion artifact throughout the examination and difficult to evaluate for pulmonary emboli in the distal vessels, particularly in the left lower lobe. There is no significant pulmonary arterial contrast in the left lower lobe. There is no evidence for a large central or lobar pulmonary embolus. Main pulmonary artery is prominent measuring up to 3.5 cm and the adjacent aorta measures 3.8 cm. Heart is prominent for size. Contrast refluxes into the IVC and hepatic veins and likely represent underlying cardiac dysfunction. Multiple low-density structures associated with the upper kidneys are incompletely evaluated but probably represent cysts. There is a moderate-sized hiatal hernia. There is a left cardiac ICD.  Moderate bilateral pleural effusions. No significant chest lymphadenopathy. Atherosclerotic calcifications in the aortic arch and descending thoracic aorta.  The trachea and mainstem bronchi are patent. Compressive atelectasis in lower lobes bilaterally. Patchy ground-glass disease in the right upper lung. There is mild ground-glass disease in the left upper lung. No acute bone abnormality.  Review of the MIP images confirms the above findings.  IMPRESSION: No evidence for a large or central pulmonary embolism. This is a limited examination due to extensive motion artifact and poor opacification of the pulmonary arteries in the lower lobes. In particular, difficult to evaluate for pulmonary emboli in the left lower lobe.  Moderate sized bilateral pleural effusions with adjacent compressive atelectasis.  Scattered patchy airspace densities, particularly the right upper lung. Differential would include asymmetric edema versus subtle infection.  Probable bilateral renal cysts.  Large amount of contrast within the IVC and  hepatic veins. Findings suggest underlying cardiac dysfunction.   Electronically Signed   By: Richarda Overlie M.D.   On: 11/12/2014 12:03    TELE AF with VR 90-100 and triggered BiV pacing   ASSESSMENT AND PLAN  Will not have full benefit of CRT until we get AF rate under 70. Rate remains fast despite IV amio and BP limits use of rate control meds. Add very low dose digoxin and metoprolol.   Thurmon Fair, MD, [facility] [phone] office [phone] pager 11/14/2014 9:47 AM

## 2014-11-15 LAB — BASIC METABOLIC PANEL
ANION GAP: 12 (ref 5–15)
BUN: 22 mg/dL (ref 6–23)
CO2: 24 mEq/L (ref 19–32)
Calcium: 8.9 mg/dL (ref 8.4–10.5)
Chloride: 102 mEq/L (ref 96–112)
Creatinine, Ser: 1.05 mg/dL (ref 0.50–1.35)
GFR calc Af Amer: 72 mL/min — ABNORMAL LOW (ref >90)
GFR calc non Af Amer: 62 mL/min — ABNORMAL LOW (ref >90)
Glucose, Bld: 105 mg/dL — ABNORMAL HIGH (ref 70–99)
Potassium: 4.2 mEq/L (ref 3.7–5.3)
Sodium: 138 mEq/L (ref 137–147)

## 2014-11-15 LAB — HEPARIN LEVEL (UNFRACTIONATED): Heparin Unfractionated: 0.41 IU/mL (ref 0.30–0.70)

## 2014-11-15 LAB — CBC
HCT: 31.8 % — ABNORMAL LOW (ref 39.0–52.0)
Hemoglobin: 10 g/dL — ABNORMAL LOW (ref 13.0–17.0)
MCH: 25.3 pg — ABNORMAL LOW (ref 26.0–34.0)
MCHC: 31.4 g/dL (ref 30.0–36.0)
MCV: 80.5 fL (ref 78.0–100.0)
PLATELETS: 207 10*3/uL (ref 150–400)
RBC: 3.95 MIL/uL — ABNORMAL LOW (ref 4.22–5.81)
RDW: 17.7 % — ABNORMAL HIGH (ref 11.5–15.5)
WBC: 5 10*3/uL (ref 4.0–10.5)

## 2014-11-15 MED ORDER — WARFARIN SODIUM 4 MG PO TABS
4.0000 mg | ORAL_TABLET | Freq: Once | ORAL | Status: AC
  Administered 2014-11-15: 4 mg via ORAL
  Filled 2014-11-15: qty 1

## 2014-11-15 MED ORDER — AMIODARONE HCL 200 MG PO TABS: 200.0000 mg | ORAL_TABLET | Freq: Every day | ORAL | Status: DC

## 2014-11-15 MED ORDER — AMIODARONE HCL 200 MG PO TABS
400.0000 mg | ORAL_TABLET | Freq: Two times a day (BID) | ORAL | Status: DC
  Administered 2014-11-15 – 2014-11-16 (×3): 400 mg via ORAL
  Filled 2014-11-15 (×4): qty 2

## 2014-11-15 MED ORDER — METOPROLOL TARTRATE 25 MG PO TABS
25.0000 mg | ORAL_TABLET | Freq: Two times a day (BID) | ORAL | Status: DC
  Administered 2014-11-15 – 2014-11-16 (×2): 25 mg via ORAL
  Filled 2014-11-15 (×2): qty 1

## 2014-11-15 MED ORDER — WARFARIN - PHARMACIST DOSING INPATIENT: Freq: Every day | Status: DC

## 2014-11-15 NOTE — Progress Notes (Signed)
ANTICOAGULATION CONSULT NOTE - follow-up  Pharmacy Consult for Heparin Indication: atrial fibrillation  Allergies  Allergen Reactions  . Morphine Other (See Comments)    Per MAR  . Wellbutrin [Bupropion] Other (See Comments)    Per [facility]    Patient Measurements: Height: 5\' 6"  (167.6 cm) Weight: 146 lb 13.2 oz (66.6 kg) IBW/kg (Calculated) : 63.8   Vital Signs: Temp: 97.9 F (36.6 C) (11/23 0422) Temp Source: Axillary (11/23 0422) BP: 107/62 mmHg (11/23 0422) Pulse Rate: 70 (11/23 0422)  Labs:  Recent Labs  11/12/14 0740  11/12/14 1340 11/12/14 1945  11/13/14 0340 11/13/14 1813 11/14/14 0443 11/15/14 0414  HGB  --   --   --   --   < > 10.1*  --  9.6* 10.0*  HCT  --   --   --   --   --  31.7*  --  31.1* 31.8*  PLT  --   --   --   --   --  223  --  193 207  HEPARINUNFRC  --   < >  --   --   < > 0.70 0.70 0.48 0.41  CREATININE  --   --   --   --   --  1.17  --  1.07 1.05  TROPONINI <0.30  --  <0.30 <0.30  --   --   --   --   --   < > = values in this interval not displayed. Estimated Creatinine Clearance: 45.6 mL/min (by C-G formula based on Cr of 1.05). Infusions:  . amiodarone 30 mg/hr (11/14/14 2128)  . heparin 1,500 Units/hr (11/14/14 2128)   Assessment: 88 yoM to ED with [facility] and hypoxemia, hx of cardiomyopathy, pacemaker; found to be in Afib.   Heparin ordered by Cardiology for AFib  Serial troponins ordered - neg   CT: no evidence of PE  Today, 11/23:  Heparin level therapeutic (0.41)  Hgb stable. Pltc WNL.  No bleeding reported in latest chart notes.  Per cards, not candidate for long-term anticoagulation d/t dementia   Goal of Therapy:  Heparin level 0.3-0.7 units/ml Monitor platelets by anticoagulation protocol: Yes   Plan:   Continue Heparin infusion at 1500 units/hr.  Daily heparin level and CBC.   Clance Boll, PharmD, BCPS Pager: [phone] 11/15/2014 7:34 AM

## 2014-11-15 NOTE — Progress Notes (Signed)
[facility] PROGRESS NOTE  Orie FishermanLloyd W Bozza ZOX:[id number] DOB: [birth year] DOA: 11/11/2014 PCP: Londell MohPHARR,WALTER DAVIDSON, MD  Assessment/Plan: #1 acute systolic CHF exacerbation Likely secondary to afib and nonischemic cardiomyopathy. I/O = - 1007cc/24hrs. Clinical improvement. Cardiac enzymes negative 2. 2-D echo with EF of 20-25% with dyskinesis of the entire anteroseptal myocardium which is different from prior 2-D echo. Continue diuretics. Unable to place on ACE inhibitor secondary to borderline blood pressure issues. Cardiology following and appreciate input and recommendations.  #2 new onset atrial fibrillation Likely secondary to loss of BIV pacing. Cardiac enzymes negative 2. 2-D echo with depressed EF of 20-25% with dyskinesis of the entire anteroseptal myocardium which is new from prior 2-D echo. Patient's metoprolol has been held secondary to hypotension. Patient has been started on IV amiodarone per cardiology and dose increased.  Patient's IV amiodarone has been changed to oral amiodarone per cardiology. Digoxin and low dose lopressor added per cardiology. Due to patient's dementia is felt not to be a candidate for long-term anticoagulation.  Patient is noted to be a long term nursing home resident. Cardiology has discussed anticoagulation with patient family and patient has been started on Coumadin for anticoagulation. Outpatient follow-up.  #3 dementia  #4 elevated d-dimer CT angiogram negative for large or central PE. Moderate sized bilateral pleural effusions with adjacent compressive atelectasis is noted.  #5 BPH Continue Flomax.  #6 hypertension Blood pressure medications on hold secondary to hypotension.  #7 prophylaxis Heparin for DVT prophylaxis.  Code Status: DO NOT RESUSCITATE Family Communication: Updated patient of family at bedside. Disposition Plan: Home when medically stable.   Consultants:  Cardiology: Dr. Patty SermonsBrackbill 11/11/2014  Procedures:  2-D  echo 11/12/2014  CT angiogram chest 11/12/2014  Chest x-ray 11/11/2014  Antibiotics:  None  HPI/Subjective: Patient sleeping, easily arousable. No CP. SOB improving. Patient c/o constipation.  Objective: Filed Vitals:   11/15/14 0422  BP: 107/62  Pulse: 70  Temp: 97.9 F (36.6 C)  Resp: 20    Intake/Output Summary (Last 24 hours) at 11/15/14 1138 Last data filed at 11/15/14 0800  Gross per 24 hour  Intake 998.37 ml  Output   1750 ml  Net -751.63 ml   Filed Weights   11/13/14 0629 11/14/14 0434 11/15/14 0422  Weight: 65.3 kg (143 lb 15.4 oz) 66.5 kg (146 lb 9.7 oz) 66.6 kg (146 lb 13.2 oz)    Exam:   General:  NAD  Cardiovascular: Irregularly irregular  Respiratory: CTAB  Abdomen: , Nontender, nondistended, positive bowel sounds.  Musculoskeletal: No clubbing cyanosis or edema.  Data Reviewed: Basic Metabolic Panel:  Recent Labs Lab 11/11/14 1624 11/12/14 0546 11/13/14 0340 11/14/14 0443 11/15/14 0414  NA 139 138 134* 136* 138  K 4.4 4.6 4.3 3.5* 4.2  CL 103 104 100 101 102  CO2 21 21 21 25 24   GLUCOSE 170* 168* 126* 112* 105*  BUN 20 25* 29* 26* 22  CREATININE 1.01 1.01 1.17 1.07 1.05  CALCIUM 9.4 9.5 9.2 8.8 8.9  MG 2.6*  --   --   --   --    Liver Function Tests:  Recent Labs Lab 11/11/14 1624  AST 21  ALT 15  ALKPHOS 110  BILITOT 0.4  PROT 7.0  ALBUMIN 3.0*   No results for input(s): LIPASE, AMYLASE in the last 168 hours. No results for input(s): AMMONIA in the last 168 hours. CBC:  Recent Labs Lab 11/11/14 1225 11/12/14 0546 11/13/14 0340 11/14/14 0443 11/15/14 0414  WBC 6.3 3.3* 7.1 5.3  5.0  HGB 10.7* 10.1* 10.1* 9.6* 10.0*  HCT 34.2* 32.8* 31.7* 31.1* 31.8*  MCV 82.2 81.4 79.8 81.2 80.5  PLT 249 219 223 193 207   Cardiac Enzymes:  Recent Labs Lab 11/11/14 1812 11/12/14 0740 11/12/14 1340 11/12/14 1945  TROPONINI <0.30 <0.30 <0.30 <0.30   BNP (last 3 results)  Recent Labs  11/11/14 1252 11/11/14 1624   PROBNP 15640.0* 15842.0*   CBG: No results for input(s): GLUCAP in the last 168 hours.  Recent Results (from the past 240 hour(s))  MRSA PCR Screening     Status: Abnormal   Collection Time: 11/11/14  5:46 PM  Result Value Ref Range Status   MRSA by PCR POSITIVE (A) NEGATIVE Final    Comment:        The GeneXpert MRSA Assay (FDA approved for NASAL specimens only), is one component of a comprehensive MRSA colonization surveillance program. It is not intended to diagnose MRSA infection nor to guide or monitor treatment for MRSA infections. RESULT CALLED TO, READ BACK BY AND VERIFIED WITH: L.REINERT,RN AT 2207 ON 11/11/14 BY SHEAW      Studies: No results found.  Scheduled Meds: . calcium-vitamin D  1 tablet Oral Daily  . digoxin  0.125 mg Oral Daily  . furosemide  40 mg Intravenous Daily  . iron polysaccharides  150 mg Oral Q lunch  . metoprolol tartrate  12.5 mg Oral BID  . polyethylene glycol  17 g Oral Daily  . potassium chloride  40 mEq Oral Daily  . senna  2 tablet Oral Daily  . sodium chloride  3 mL Intravenous Q12H  . tamsulosin  0.4 mg Oral QPC supper   Continuous Infusions: . amiodarone 30 mg/hr (11/14/14 2128)  . heparin 1,500 Units/hr (11/14/14 2128)    Principal Problem:   Acute combined systolic and diastolic congestive heart failure, NYHA class 4 Active Problems:   BPH (benign prostatic hypertrophy) with urinary retention   Constipation   Alzheimer's dementia   Atrial fibrillation   Biventricular ICD (implantable cardioverter-defibrillator) in place    Time spent: 35 minutes    THOMPSON,DANIEL M.D. [facility] Pager [phone]. If 7PM-7AM, please contact night-coverage at [email or web address], password [facility] 11/15/2014, 11:38 AM  LOS: 4 days

## 2014-11-15 NOTE — Progress Notes (Signed)
ANTICOAGULATION CONSULT NOTE - Initial  Pharmacy Consult for Warfarin Indication: atrial fibrillation  Allergies  Allergen Reactions  . Morphine Other (See Comments)    Per MAR  . Wellbutrin [Bupropion] Other (See Comments)    Per [facility]    Patient Measurements: Height: 5\' 6"  (167.6 cm) Weight: 146 lb 13.2 oz (66.6 kg) IBW/kg (Calculated) : 63.8   Vital Signs: Temp: 97.9 F (36.6 C) (11/23 0422) Temp Source: Axillary (11/23 0422) BP: 107/62 mmHg (11/23 0422) Pulse Rate: 70 (11/23 0422)  Labs:  Recent Labs  11/12/14 1340 11/12/14 1945  11/13/14 0340 11/13/14 1813 11/14/14 0443 11/15/14 0414  HGB  --   --   < > 10.1*  --  9.6* 10.0*  HCT  --   --   --  31.7*  --  31.1* 31.8*  PLT  --   --   --  223  --  193 207  HEPARINUNFRC  --   --   < > 0.70 0.70 0.48 0.41  CREATININE  --   --   --  1.17  --  1.07 1.05  TROPONINI <0.30 <0.30  --   --   --   --   --   < > = values in this interval not displayed. Estimated Creatinine Clearance: 45.6 mL/min (by C-G formula based on Cr of 1.05). Infusions:  . heparin 1,500 Units/hr (11/14/14 2128)   Assessment: 75 yoM with history of dementia presents to ED from SNF with [facility] and hypoxemia, hx of cardiomyopathy, pacemaker; found to be in Afib.   Heparin ordered by Cardiology for AFib, started 11/19  Serial troponins ordered - neg   CT: no evidence of PE  Warfarin ordered to start 11/23. Patient's NH will be managing warfarin after discharge.  Today, 11/23:  INR 1.30 on 11/19  Heparin level was therapeutic this AM (0.41)  Hgb stable. Pltc WNL.  No bleeding reported in latest chart notes.  Note patient is on amiodarone, which can increase the concentration of warfarin.  Amiodarone was started this admission so effect on INR may be delayed.  Goal of Therapy:  INR 2-3 Heparin level 0.3-0.7 units/ml Monitor platelets by anticoagulation protocol: Yes   Plan:  1.  Warfarin 4 mg once today. 2.  Daily INR. 3.  Continue  heparin infusion until INR therapeutic.  Clance Boll, PharmD, BCPS Pager: [phone] 11/15/2014 1:07 PM

## 2014-11-15 NOTE — Progress Notes (Signed)
Patient Name: Kevin [name] Date of Encounter: 11/15/2014  Principal Problem:   Acute combined systolic and diastolic congestive heart failure, NYHA class 4 Active Problems:   BPH (benign prostatic hypertrophy) with urinary retention   Constipation   Alzheimer's dementia   Atrial fibrillation   Biventricular ICD (implantable cardioverter-defibrillator) in place   Length of Stay: 4  SUBJECTIVE  Substantial improvement in breathing and mentation. Ventricular rate control much better. Heart rate in 70-80s. Still mostly triggered pacing rather than true CRT.  CURRENT MEDS . amiodarone  400 mg Oral BID  . calcium-vitamin D  1 tablet Oral Daily  . digoxin  0.125 mg Oral Daily  . furosemide  40 mg Intravenous Daily  . iron polysaccharides  150 mg Oral Q lunch  . metoprolol tartrate  25 mg Oral BID  . polyethylene glycol  17 g Oral Daily  . potassium chloride  40 mEq Oral Daily  . senna  2 tablet Oral Daily  . sodium chloride  3 mL Intravenous Q12H  . tamsulosin  0.4 mg Oral QPC supper    OBJECTIVE   Intake/Output Summary (Last 24 hours) at 11/15/14 1225 Last data filed at 11/15/14 0800  Gross per 24 hour  Intake 998.37 ml  Output   1750 ml  Net -751.63 ml   Filed Weights   11/13/14 0629 11/14/14 0434 11/15/14 0422  Weight: 143 lb 15.4 oz (65.3 kg) 146 lb 9.7 oz (66.5 kg) 146 lb 13.2 oz (66.6 kg)    PHYSICAL EXAM Filed Vitals:   11/14/14 1224 11/14/14 1347 11/14/14 2145 11/15/14 0422  BP: 107/70 95/70 102/67 107/62  Pulse: 105 88 84 70  Temp:  98.7 F (37.1 C) 98.2 F (36.8 C) 97.9 F (36.6 C)  TempSrc:  Oral Oral Axillary  Resp:  18 18 20   Height:      Weight:    146 lb 13.2 oz (66.6 kg)  SpO2:  100% 100% 99%   General: Alert, oriented x2, no distress Head: no evidence of trauma, PERRL, EOMI, no exophtalmos or lid lag, no myxedema, no xanthelasma; normal ears, nose and oropharynx Neck: normal jugular venous pulsations and no hepatojugular reflux; brisk  carotid pulses without delay and no carotid bruits Chest: clear to auscultation, no signs of consolidation by percussion or palpation, normal fremitus, symmetrical and full respiratory excursions Cardiovascular: normal position and quality of the apical impulse, irregular rhythm, normal first and split second heart sounds, no rubs or gallops, 2/6 holosystolic murmur Abdomen: no tenderness or distention, no masses by palpation, no abnormal pulsatility or arterial bruits, normal bowel sounds, no hepatosplenomegaly Extremities: no clubbing, cyanosis or edema; 2+ radial, ulnar and brachial pulses bilaterally; 2+ right femoral, posterior tibial and dorsalis pedis pulses; 2+ left femoral, posterior tibial and dorsalis pedis pulses; no subclavian or femoral bruits Neurological: grossly   LABS  CBC  Recent Labs  11/14/14 0443 11/15/14 0414  WBC 5.3 5.0  HGB 9.6* 10.0*  HCT 31.1* 31.8*  MCV 81.2 80.5  PLT 193 207   Basic Metabolic Panel  Recent Labs  11/14/14 0443 11/15/14 0414  NA 136* 138  K 3.5* 4.2  CL 101 102  CO2 25 24  GLUCOSE 112* 105*  BUN 26* 22  CREATININE 1.07 1.05  CALCIUM 8.8 8.9   Liver Function Tests No results for input(s): AST, ALT, ALKPHOS, BILITOT, PROT, ALBUMIN in the last 72 hours. No results for input(s): LIPASE, AMYLASE in the last 72 hours. Cardiac Enzymes  Recent Labs  11/12/14 1340 11/12/14 1945  TROPONINI <0.30 <0.30    TELE AF VR 70-80, triggered pacing  ASSESSMENT AND PLAN  Markedly improved acute on chronic systolic heart failure due to nonischemic CMP and AF-related loss of CRT. Ideally, want to bring ventricular response to <70 to allow full benefit of CRT. Increase beta blocker - BP seems to tolerate it. He is nonambulatory. Transition to PO amiodarone 400 mg BID for 7 days , then 400 mg daily for 1 month, then 200 mg daily. Discussed warfarin versus NOAC with patient, his daughter Marylu LundJanet and wife Kathie RhodesBetty. Since he is a NH resident, fewer  drawbacks with warfarin so will use that.   Thurmon FairMihai Dhruvan Gullion, MD, [facility] [phone] office [phone] pager 11/15/2014 12:25 PM

## 2014-11-16 LAB — HEPARIN LEVEL (UNFRACTIONATED): Heparin Unfractionated: 0.64 IU/mL (ref 0.30–0.70)

## 2014-11-16 LAB — BASIC METABOLIC PANEL
ANION GAP: 7 (ref 5–15)
BUN: 17 mg/dL (ref 6–23)
CHLORIDE: 102 meq/L (ref 96–112)
CO2: 30 mEq/L (ref 19–32)
Calcium: 9.3 mg/dL (ref 8.4–10.5)
Creatinine, Ser: 1.13 mg/dL (ref 0.50–1.35)
GFR calc Af Amer: 65 mL/min — ABNORMAL LOW (ref >90)
GFR calc non Af Amer: 56 mL/min — ABNORMAL LOW (ref >90)
Glucose, Bld: 92 mg/dL (ref 70–99)
Potassium: 4.9 mEq/L (ref 3.7–5.3)
Sodium: 139 mEq/L (ref 137–147)

## 2014-11-16 LAB — PROTIME-INR
INR: 1.22 (ref 0.00–1.49)
Prothrombin Time: 15.5 seconds — ABNORMAL HIGH (ref 11.6–15.2)

## 2014-11-16 LAB — CBC
HCT: 31.7 % — ABNORMAL LOW (ref 39.0–52.0)
HEMOGLOBIN: 9.9 g/dL — AB (ref 13.0–17.0)
MCH: 25.3 pg — ABNORMAL LOW (ref 26.0–34.0)
MCHC: 31.2 g/dL (ref 30.0–36.0)
MCV: 80.9 fL (ref 78.0–100.0)
Platelets: 217 10*3/uL (ref 150–400)
RBC: 3.92 MIL/uL — AB (ref 4.22–5.81)
RDW: 17.7 % — ABNORMAL HIGH (ref 11.5–15.5)
WBC: 4.4 10*3/uL (ref 4.0–10.5)

## 2014-11-16 MED ORDER — POTASSIUM CHLORIDE CRYS ER 20 MEQ PO TBCR: 40.0000 meq | EXTENDED_RELEASE_TABLET | Freq: Every day | ORAL | Status: AC

## 2014-11-16 MED ORDER — DIGOXIN 125 MCG PO TABS: 0.1250 mg | ORAL_TABLET | Freq: Every day | ORAL | Status: AC

## 2014-11-16 MED ORDER — LORAZEPAM 0.5 MG PO TABS: 0.5000 mg | ORAL_TABLET | Freq: Three times a day (TID) | ORAL | Status: DC | PRN

## 2014-11-16 MED ORDER — AMIODARONE HCL 400 MG PO TABS: 400.0000 mg | ORAL_TABLET | Freq: Two times a day (BID) | ORAL | Status: DC

## 2014-11-16 MED ORDER — FUROSEMIDE 40 MG PO TABS: 40.0000 mg | ORAL_TABLET | Freq: Every day | ORAL | Status: DC

## 2014-11-16 MED ORDER — WARFARIN SODIUM 4 MG PO TABS
4.0000 mg | ORAL_TABLET | Freq: Once | ORAL | Status: DC
  Filled 2014-11-16: qty 1

## 2014-11-16 MED ORDER — TRAMADOL HCL 50 MG PO TABS: 50.0000 mg | ORAL_TABLET | Freq: Four times a day (QID) | ORAL | Status: DC | PRN

## 2014-11-16 MED ORDER — WARFARIN SODIUM 4 MG PO TABS: 4.0000 mg | ORAL_TABLET | Freq: Once | ORAL | Status: DC

## 2014-11-16 MED ORDER — AMIODARONE HCL 200 MG PO TABS: 200.0000 mg | ORAL_TABLET | Freq: Every day | ORAL | Status: AC

## 2014-11-16 MED ORDER — METOPROLOL TARTRATE 25 MG PO TABS: 25.0000 mg | ORAL_TABLET | Freq: Two times a day (BID) | ORAL | Status: DC

## 2014-11-16 MED ORDER — WARFARIN SODIUM 4 MG PO TABS
4.0000 mg | ORAL_TABLET | Freq: Once | ORAL | Status: AC
  Administered 2014-11-16: 4 mg via ORAL
  Filled 2014-11-16: qty 1

## 2014-11-16 NOTE — Progress Notes (Signed)
Report called to [facility]. Discharge summary was reviewed and all questions were answered. Pt is in stable condition and waiting on transport to return back to [facility].

## 2014-11-16 NOTE — Discharge Summary (Signed)
Physician Discharge Summary  Kevin [name] ZOX:[id number] DOB: 07/07/1925 DOA: 11/11/2014  PCP: Londell Moh, MD  Admit date: 11/11/2014 Discharge date: 11/16/2014  Time spent: 70 minutes  Recommendations for Outpatient Follow-up:  1. Follow-up with M.D. at skilled nursing facility. Patient will need a PT/INR checked on Friday, 11/19/2014 and Coumadin dose adjusted appropriately with a goal INR of 2-3. Patient will also need a basic metabolic profile done in 1 week to follow-up on electrolytes and renal function. 2. Follow-up with Dr. Royann Shivers of cardiology in 2 weeks. 3. Follow-up with Londell Moh, MD in 1 week.  Discharge Diagnoses:  Principal Problem:   Acute combined systolic and diastolic congestive heart failure, NYHA class 4 Active Problems:   BPH (benign prostatic hypertrophy) with urinary retention   Constipation   Alzheimer's dementia   Atrial fibrillation   Biventricular ICD (implantable cardioverter-defibrillator) in place   Discharge Condition: Stable and improved  Diet recommendation: Heart healthy  Filed Weights   11/14/14 0434 11/15/14 0422 11/16/14 0532  Weight: 66.5 kg (146 lb 9.7 oz) 66.6 kg (146 lb 13.2 oz) 62.6 kg (138 lb 0.1 oz)    History of present illness:  28 year year old male with past medical history of dementia, history of atrial fibrillation, hypertension who presented from SNF with worsening shortness of breath for past few days prior to this admission. No complaints of chest pain, palpitations. No fevers or chills. Patient did report cough productive of clear to yellowish sputum. No abdominal pain, nausea or vomiting. No lightheadedness or loss of consciousness. No urinary complaints. In ED. BP was 122/103, HR 71-120, oxygen saturation 95%. The 12 lead EKG showed atrial fibrillation. BNP was elevated in 15 K range. CXR showed cardiomegaly, pulmonary vascular congestion consistent with CHF. [facility] hospitalists were all to admit the  patient for further evaluation and management. Cardiology consultation was also ordered per ED physician.   Hospital Course:  #1 acute systolic CHF exacerbation Likely secondary to afib and nonischemic cardiomyopathy. Patient was placed on IV Lasix and cardiology consultation obtained on admission. Cardiac enzymes negative 2. 2-D echo with EF of 20-25% with dyskinesis of the entire anteroseptal myocardium which is different from prior 2-D echo. Patient was maintained on IV diuretics and blood pressure monitor closely. Patient diuresed well and improved clinically during the hospitalization. Patient weight on discharge was 62.6 kg. Patient's IV Lasix and will be subsequently be transitioned to oral Lasix and patient be discharged on Lasix 40 mg daily. Unable to place on ACE inhibitor secondary to borderline blood pressure issues. Patient will follow-up with cardiology as outpatient.   #2 new onset atrial fibrillation Likely secondary to loss of BIV pacing. Cardiac enzymes negative 2. 2-D echo with depressed EF of 20-25% with dyskinesis of the entire anteroseptal myocardium which is new from prior 2-D echo. Patient's metoprolol has been held secondary to hypotension. Patient was started on IV amiodarone per cardiology and dose increased for better rate control. Digoxin and low dose lopressor added per cardiology. Patient's heart rate became better controlled and IV amiodarone was subsequently transitioned to oral amiodarone 400 mg twice daily. Was recommended that patient be on amiodarone 400 mg twice daily for 7 days and then amiodarone 400 mg daily 1 month and then amiodarone 200 mg daily. Due to patient's dementia it was initially felt that patient was not a candidate for long-term anticoagulation. Patient is noted to be a long term nursing home resident. Cardiology has discussed anticoagulation with patient family and patient has been  started on Coumadin for anticoagulation. Patient be discharged in  stable and improved condition on Coumadin 4 mg daily. Patient need a PT/INR checked on Friday, 11/19/2014 and Coumadin doses adjusted appropriately per M.D. at skilled nursing facility. Goal INR is between 2-3. Patient will also follow-up with cardiology as outpatient. Patient will be discharged in stable and improved condition.   #3 dementia  #4 elevated d-dimer CT angiogram negative for large or central PE. Moderate sized bilateral pleural effusions with adjacent compressive atelectasis is noted.  #5 BPH Continued on home regimen of Flomax.  #6 hypertension Blood pressure medications were initially held secondary to hypotension.   Procedures:  2-D echo 11/12/2014  CT angiogram chest 11/12/2014  Chest x-ray 11/11/2014  Consultations:  Cardiology: Dr. Patty Sermons 11/11/2014  Discharge Exam: Filed Vitals:   11/16/14 0956  BP: 105/69  Pulse: 75  Temp:   Resp:     General: NAD Cardiovascular: RRR Respiratory: CTAB  Discharge Instructions You were cared for by a hospitalist during your hospital stay. If you have any questions about your discharge medications or the care you received while you were in the hospital after you are discharged, you can call the unit and asked to speak with the hospitalist on call if the hospitalist that took care of you is not available. Once you are discharged, your primary care physician will handle any further medical issues. Please note that NO REFILLS for any discharge medications will be authorized once you are discharged, as it is imperative that you return to your primary care physician (or establish a relationship with a primary care physician if you do not have one) for your aftercare needs so that they can reassess your need for medications and monitor your lab values.  Discharge Instructions    Diet - low sodium heart healthy    Complete by:  As directed      Discharge instructions    Complete by:  As directed   Follow up with MD at  SNF. Patient will need BMET in 1 week. Patient will need PT/INR check on Friday 11/19/14 and dose of coumadin adjusted per MD at SNF with goal INR 2-3. Follow up with cardiology in 2 weeks.     Increase activity slowly    Complete by:  As directed           Current Discharge Medication List    START taking these medications   Details  !! amiodarone (PACERONE) 200 MG tablet Take 1 tablet (200 mg total) by mouth daily. Start amiodarone 200mg  daily on 12/24/14 Qty: 31 tablet, Refills: 0    !! amiodarone (PACERONE) 400 MG tablet Take 1 tablet (400 mg total) by mouth 2 (two) times daily. Take 400mg  2 times daily for 1 week, until 11/22/14, then 400mg  daily x 1 month till 12/23/14. Qty: 44 tablet, Refills: 0    digoxin (LANOXIN) 0.125 MG tablet Take 1 tablet (0.125 mg total) by mouth daily. Qty: 31 tablet, Refills: 0    furosemide (LASIX) 40 MG tablet Take 1 tablet (40 mg total) by mouth daily. Qty: 30 tablet, Refills: 0    LORazepam (ATIVAN) 0.5 MG tablet Take 1 tablet (0.5 mg total) by mouth every 8 (eight) hours as needed for anxiety. Qty: 20 tablet, Refills: 0    metoprolol tartrate (LOPRESSOR) 25 MG tablet Take 1 tablet (25 mg total) by mouth 2 (two) times daily. Qty: 62 tablet, Refills: 0    potassium chloride SA (K-DUR,KLOR-CON) 20 MEQ tablet Take 2 tablets (  40 mEq total) by mouth daily. Qty: 31 tablet, Refills: 0    warfarin (COUMADIN) 4 MG tablet Take 1 tablet (4 mg total) by mouth once. Qty: 31 tablet, Refills: 0     !! - Potential duplicate medications found. Please discuss with provider.    CONTINUE these medications which have CHANGED   Details  traMADol (ULTRAM) 50 MG tablet Take 1 tablet (50 mg total) by mouth every 6 (six) hours as needed. For pain Qty: 20 tablet, Refills: 0      CONTINUE these medications which have NOT CHANGED   Details  Calcium Carb-Cholecalciferol (CALCIUM-VITAMIN D) 500-400 MG-UNIT TABS Take 1 tablet by mouth daily.    guaiFENesin  (ROBITUSSIN) 100 MG/5ML SOLN Take 10 mLs by mouth every 6 (six) hours as needed for cough (cough).     iron polysaccharides (NIFEREX) 150 MG capsule Take 150 mg by mouth daily.    magnesium hydroxide (MILK OF MAGNESIA) 400 MG/5ML suspension Take 30 mLs by mouth daily as needed for mild constipation (constipation).     polyethylene glycol (MIRALAX / GLYCOLAX) packet Take 17 g by mouth daily.    senna (SENOKOT) 8.6 MG tablet Take 2 tablets by mouth daily.    Tamsulosin HCl (FLOMAX) 0.4 MG CAPS Take 1 capsule (0.4 mg total) by mouth daily after supper. Qty: 30 capsule, Refills: 0      STOP taking these medications     levofloxacin (LEVAQUIN) 500 MG tablet        Allergies  Allergen Reactions  . Morphine Other (See Comments)    Per MAR  . Wellbutrin [Bupropion] Other (See Comments)    Per MAR    Follow-up Information    Follow up with Londell Moh, MD. Schedule an appointment as soon as possible for a visit in 1 week.   Specialty:  Internal Medicine   Contact information:   [address] [place] [address] [place] [address] [phone]       Please follow up.   Why:  f/u with MD at SNF      Follow up with CROITORU,MIHAI, MD. Schedule an appointment as soon as possible for a visit in 2 weeks.   Specialty:  Cardiology   Contact information:   [address] [place] [address] [phone]       Follow up On 11/19/2014.   Why:  PT/INR check on friday 11/19/14       The results of significant diagnostics from this hospitalization (including imaging, microbiology, ancillary and laboratory) are listed below for reference.    Significant Diagnostic Studies: Dg Chest 2 View (if Patient Has Fever And/or Copd)  11/11/2014   CLINICAL DATA:  Shortness of breath, history of Alzheimer's disease  EXAM: CHEST  2 VIEW  COMPARISON:  Portable chest x-ray of 12/27/2010  FINDINGS: There is moderate cardiomegaly present with pulmonary vascular congestion and  probable small effusions most consistent with congestive heart failure. An AICD lead remains. There are degenerative changes noted in both shoulders.  IMPRESSION: Cardiomegaly, pulmonary vascular congestion, and effusions most consistent with CHF.   Electronically Signed   By: Dwyane Dee M.D.   On: 11/11/2014 13:10   Ct Angio Chest Pe W/cm &/or Wo Cm  11/12/2014   CLINICAL DATA:  History of dementia and atrial fibrillation. Worsening shortness of breath. Productive cough.  EXAM: CT ANGIOGRAPHY CHEST WITH CONTRAST  TECHNIQUE: Multidetector CT imaging of the chest was performed using the standard protocol during bolus administration of intravenous contrast. Multiplanar  CT image reconstructions and MIPs were obtained to evaluate the vascular anatomy.  CONTRAST:  OMNIPAQUE IOHEXOL 350 MG/ML SOLN  COMPARISON:  Chest radiograph 11/11/2014  FINDINGS: The contrast bolus is adequate but there is motion artifact throughout the examination and difficult to evaluate for pulmonary emboli in the distal vessels, particularly in the left lower lobe. There is no significant pulmonary arterial contrast in the left lower lobe. There is no evidence for a large central or lobar pulmonary embolus. Main pulmonary artery is prominent measuring up to 3.5 cm and the adjacent aorta measures 3.8 cm. Heart is prominent for size. Contrast refluxes into the IVC and hepatic veins and likely represent underlying cardiac dysfunction. Multiple low-density structures associated with the upper kidneys are incompletely evaluated but probably represent cysts. There is a moderate-sized hiatal hernia. There is a left cardiac ICD.  Moderate bilateral pleural effusions. No significant chest lymphadenopathy. Atherosclerotic calcifications in the aortic arch and descending thoracic aorta.  The trachea and mainstem bronchi are patent. Compressive atelectasis in lower lobes bilaterally. Patchy ground-glass disease in the right upper lung. There is  mild ground-glass disease in the left upper lung. No acute bone abnormality.  Review of the MIP images confirms the above findings.  IMPRESSION: No evidence for a large or central pulmonary embolism. This is a limited examination due to extensive motion artifact and poor opacification of the pulmonary arteries in the lower lobes. In particular, difficult to evaluate for pulmonary emboli in the left lower lobe.  Moderate sized bilateral pleural effusions with adjacent compressive atelectasis.  Scattered patchy airspace densities, particularly the right upper lung. Differential would include asymmetric edema versus subtle infection.  Probable bilateral renal cysts.  Large amount of contrast within the IVC and hepatic veins. Findings suggest underlying cardiac dysfunction.   Electronically Signed   By: Richarda Overlie M.D.   On: 11/12/2014 12:03    Microbiology: Recent Results (from the past 240 hour(s))  MRSA PCR Screening     Status: Abnormal   Collection Time: 11/11/14  5:46 PM  Result Value Ref Range Status   MRSA by PCR POSITIVE (A) NEGATIVE Final    Comment:        The GeneXpert MRSA Assay (FDA approved for NASAL specimens only), is one component of a comprehensive MRSA colonization surveillance program. It is not intended to diagnose MRSA infection nor to guide or monitor treatment for MRSA infections. RESULT CALLED TO, READ BACK BY AND VERIFIED WITH: L.REINERT,RN AT 2207 ON 11/11/14 BY SHEAW      Labs: Basic Metabolic Panel:  Recent Labs Lab 11/11/14 1624 11/12/14 0546 11/13/14 0340 11/14/14 0443 11/15/14 0414 11/16/14 0444  NA 139 138 134* 136* 138 139  K 4.4 4.6 4.3 3.5* 4.2 4.9  CL 103 104 100 101 102 102  CO2 21 21 21 25 24 30   GLUCOSE 170* 168* 126* 112* 105* 92  BUN 20 25* 29* 26* 22 17  CREATININE 1.01 1.01 1.17 1.07 1.05 1.13  CALCIUM 9.4 9.5 9.2 8.8 8.9 9.3  MG 2.6*  --   --   --   --   --    Liver Function Tests:  Recent Labs Lab 11/11/14 1624  AST 21  ALT  15  ALKPHOS 110  BILITOT 0.4  PROT 7.0  ALBUMIN 3.0*   No results for input(s): LIPASE, AMYLASE in the last 168 hours. No results for input(s): AMMONIA in the last 168 hours. CBC:  Recent Labs Lab 11/12/14 0546 11/13/14 0340 11/14/14  [id number] 11/15/14 0414 11/16/14 0444  WBC 3.3* 7.1 5.3 5.0 4.4  HGB 10.1* 10.1* 9.6* 10.0* 9.9*  HCT 32.8* 31.7* 31.1* 31.8* 31.7*  MCV 81.4 79.8 81.2 80.5 80.9  PLT 219 223 193 207 217   Cardiac Enzymes:  Recent Labs Lab 11/11/14 1812 11/12/14 0740 11/12/14 1340 11/12/14 1945  TROPONINI <0.30 <0.30 <0.30 <0.30   BNP: BNP (last 3 results)  Recent Labs  11/11/14 1252 11/11/14 1624  PROBNP 15640.0* 15842.0*   CBG: No results for input(s): GLUCAP in the last 168 hours.     SignedRamiro Harvest:  THOMPSON,DANIEL MD [facility] 11/16/2014, 1:30 PM

## 2014-11-16 NOTE — Progress Notes (Signed)
Patient is set to discharge back to [facility] today. Patient & son, Kevin [name] aware. Discharge packet given to RN, Catie/Kristyn. [facility] called for transport.   Lincoln Maxin, LCSW [facility] Clinical Social Worker cell #: [phone]

## 2014-11-16 NOTE — Progress Notes (Signed)
Patient Name: Kevin [name] Date of Encounter: 11/16/2014  Principal Problem:   Acute combined systolic and diastolic congestive heart failure, NYHA class 4 Active Problems:   BPH (benign prostatic hypertrophy) with urinary retention   Constipation   Alzheimer's dementia   Atrial fibrillation   Biventricular ICD (implantable cardioverter-defibrillator) in place   Length of Stay: 5  SUBJECTIVE  Lying fully supine, no complaints. Greatly improved rate control, near 100% BiV paced rhythm now.  Fairly balanced in/out.  CURRENT MEDS . [START ON 11/22/2014] amiodarone  200 mg Oral Daily  . amiodarone  400 mg Oral BID  . calcium-vitamin D  1 tablet Oral Daily  . digoxin  0.125 mg Oral Daily  . furosemide  40 mg Intravenous Daily  . iron polysaccharides  150 mg Oral Q lunch  . metoprolol tartrate  25 mg Oral BID  . polyethylene glycol  17 g Oral Daily  . potassium chloride  40 mEq Oral Daily  . senna  2 tablet Oral Daily  . sodium chloride  3 mL Intravenous Q12H  . tamsulosin  0.4 mg Oral QPC supper  . Warfarin - Pharmacist Dosing Inpatient   Does not apply q1800    OBJECTIVE   Intake/Output Summary (Last 24 hours) at 11/16/14 0910 Last data filed at 11/16/14 0700  Gross per 24 hour  Intake    936 ml  Output   1650 ml  Net   -714 ml   Filed Weights   11/14/14 0434 11/15/14 0422 11/16/14 0532  Weight: 146 lb 9.7 oz (66.5 kg) 146 lb 13.2 oz (66.6 kg) 138 lb 0.1 oz (62.6 kg)    PHYSICAL EXAM Filed Vitals:   11/15/14 1332 11/15/14 2128 11/15/14 2304 11/16/14 0532  BP: 99/58 97/71 101/59 104/60  Pulse: 69 76 72 75  Temp: 97.9 F (36.6 C) 97.6 F (36.4 C)  97.8 F (36.6 C)  TempSrc: Oral Oral  Oral  Resp: 28 18  18   Height:      Weight:    138 lb 0.1 oz (62.6 kg)  SpO2: 100% 100%  100%   General: Alert, oriented x2, no distress Head: no evidence of trauma, PERRL, EOMI, no exophtalmos or lid lag, no myxedema, no xanthelasma; normal ears, nose and oropharynx Neck:  normal jugular venous pulsations and no hepatojugular reflux; brisk carotid pulses without delay and no carotid bruits Chest: clear to auscultation, no signs of consolidation by percussion or palpation, normal fremitus, symmetrical and full respiratory excursions Cardiovascular: normal position and quality of the apical impulse, irregular rhythm, normal first and split second heart sounds, no rubs or gallops, 2/6 holosystolic murmur Abdomen: no tenderness or distention, no masses by palpation, no abnormal pulsatility or arterial bruits, normal bowel sounds, no hepatosplenomegaly Extremities: no clubbing, cyanosis or edema; 2+ radial, ulnar and brachial pulses bilaterally; 2+ right femoral, posterior tibial and dorsalis pedis pulses; 2+ left femoral, posterior tibial and dorsalis pedis pulses; no subclavian or femoral bruits Neurological: grossly  LABS  CBC  Recent Labs  11/15/14 0414 11/16/14 0444  WBC 5.0 4.4  HGB 10.0* 9.9*  HCT 31.8* 31.7*  MCV 80.5 80.9  PLT 207 217   Basic Metabolic Panel  Recent Labs  11/15/14 0414 11/16/14 0444  NA 138 139  K 4.2 4.9  CL 102 102  CO2 24 30  GLUCOSE 105* 92  BUN 22 17  CREATININE 1.05 1.13  CALCIUM 8.9 9.3    Radiology Studies Imaging results have been reviewed   TELE  AFib, mostly BiV paced, occ. ventricular sensed  ASSESSMENT AND PLAN  No plan for additional changes in meds except amiodarone reduction in dose as outlined and warfarin per INR. Expect some challenges in short term with INR fluctuations related to gradual onset of full amiodarone effect. He does not have to have full therapeutic anticoagulation for discharge. INR can be followed at [facility]. Will get him "plugged in" to remote device monitoring again.   Thurmon Fair, MD, [facility] [phone] office [phone] pager 11/16/2014 9:10 AM

## 2014-11-16 NOTE — Plan of Care (Signed)
Problem: Phase I Progression Outcomes Goal: Initial discharge plan identified Outcome: Completed/Met Date Met:  11/16/14 Goal: Other Phase I Outcomes/Goals Outcome: Completed/Met Date Met:  11/16/14  Problem: Phase II Progression Outcomes Goal: Discharge plan established Outcome: Completed/Met Date Met:  11/16/14 Goal: Other Phase II Outcomes/Goals Outcome: Completed/Met Date Met:  11/16/14

## 2014-11-16 NOTE — Progress Notes (Addendum)
ANTICOAGULATION CONSULT NOTE - Follow Up  Pharmacy Consult for Warfarin/Heparin Indication: atrial fibrillation  Allergies  Allergen Reactions  . Morphine Other (See Comments)    Per MAR  . Wellbutrin [Bupropion] Other (See Comments)    Per [facility]    Patient Measurements: Height: 5\' 6"  (167.6 cm) Weight: 138 lb 0.1 oz (62.6 kg) IBW/kg (Calculated) : 63.8   Vital Signs: Temp: 97.8 F (36.6 C) (11/24 0532) Temp Source: Oral (11/24 0532) BP: 105/69 mmHg (11/24 0956) Pulse Rate: 75 (11/24 0956)  Labs:  Recent Labs  11/14/14 0443 11/15/14 0414 11/16/14 0444 11/16/14 0445  HGB 9.6* 10.0* 9.9*  --   HCT 31.1* 31.8* 31.7*  --   PLT 193 207 217  --   LABPROT  --   --  15.5*  --   INR  --   --  1.22  --   HEPARINUNFRC 0.48 0.41  --  0.64  CREATININE 1.07 1.05 1.13  --    Estimated Creatinine Clearance: 40.8 mL/min (by C-G formula based on Cr of 1.13). Infusions:  . heparin 1,500 Units/hr (11/16/14 0519)   Assessment: 62 yoM with history of dementia presents to ED from SNF with [facility] and hypoxemia, hx of cardiomyopathy, pacemaker; found to be in Afib.   Heparin ordered by Cardiology for AFib, started 11/19  Serial troponins ordered - neg   CT: no evidence of PE  Warfarin ordered to start 11/23. Patient's NH will be managing warfarin after discharge.  Today, 11/24:  INR 1.22  Heparin level was therapeutic this AM (0.64)  Hgb stable. Pltc WNL.  No bleeding reported in latest chart notes.  Note patient is on amiodarone, which can increase the concentration of warfarin.  Amiodarone was started this admission so effect on INR may be delayed.  MD discussing potential discharge to SNF later today (11/24).  Goal of Therapy:  INR 2-3 Heparin level 0.3-0.7 units/ml Monitor platelets by anticoagulation protocol: Yes   Plan:  1.  Warfarin 4 mg once today at 1400 (scheduling early so that patient can receive a dose prior to discharge).  For discharge planning, would  discharge on warfarin 4 mg daily with close INR follow up and adjustment per SNF.  Again, note patient will likely require continued adjustments for several weeks with new start of amiodarone. 2.  Continue heparin infusion at current rate while admitted while INR<2; however, may discontinue drip at discharge per cardiology recommendation.  INR does not need to be therapeutic before discharge.  May discharge on warfarin alone for SNF to f/u and adjust. 3.  Did not provide warfarin education to patient due to dementia.  SNF will manage warfarin.  Clance Boll, PharmD, BCPS Pager: [phone] 11/16/2014 11:41 AM

## 2014-11-17 ENCOUNTER — Telehealth: Payer: Self-pay | Admitting: *Deleted

## 2014-11-17 NOTE — Telephone Encounter (Signed)
Spoke to RN at [facility] about remote transmissions. RN stated that she was not aware if pt had HM and anyone who would know had gone home for the evening. She asked if I could call back tomorrow about this. I informed her that the ofc is closed tomorrow, but I will call back on Monday when I return. RN voiced understanding.

## 2014-11-22 ENCOUNTER — Telehealth: Payer: Self-pay | Admitting: Cardiovascular Disease

## 2014-11-22 ENCOUNTER — Other Ambulatory Visit: Payer: Self-pay | Admitting: *Deleted

## 2014-11-22 ENCOUNTER — Encounter: Payer: Self-pay | Admitting: Cardiovascular Disease

## 2014-11-22 LAB — BASIC METABOLIC PANEL
BUN: 24 mg/dL — AB (ref 4–21)
Creatinine: 1.4 mg/dL — AB (ref 0.6–1.3)
Glucose: 90 mg/dL
POTASSIUM: 4.6 mmol/L (ref 3.4–5.3)
Sodium: 138 mmol/L (ref 137–147)

## 2014-11-22 MED ORDER — TRAMADOL HCL 50 MG PO TABS: 50.0000 mg | ORAL_TABLET | Freq: Four times a day (QID) | ORAL | Status: DC | PRN

## 2014-11-22 NOTE — Telephone Encounter (Signed)
[facility] 

## 2014-11-23 ENCOUNTER — Non-Acute Institutional Stay (SKILLED_NURSING_FACILITY): Payer: Medicare Other | Admitting: Internal Medicine

## 2014-11-23 ENCOUNTER — Encounter: Payer: Self-pay | Admitting: Internal Medicine

## 2014-11-23 DIAGNOSIS — I5041 Acute combined systolic (congestive) and diastolic (congestive) heart failure: Secondary | ICD-10-CM

## 2014-11-23 DIAGNOSIS — N400 Enlarged prostate without lower urinary tract symptoms: Secondary | ICD-10-CM

## 2014-11-23 DIAGNOSIS — Z95810 Presence of automatic (implantable) cardiac defibrillator: Secondary | ICD-10-CM

## 2014-11-23 DIAGNOSIS — N401 Enlarged prostate with lower urinary tract symptoms: Secondary | ICD-10-CM

## 2014-11-23 DIAGNOSIS — F0280 Dementia in other diseases classified elsewhere without behavioral disturbance: Secondary | ICD-10-CM

## 2014-11-23 DIAGNOSIS — I4891 Unspecified atrial fibrillation: Secondary | ICD-10-CM

## 2014-11-23 DIAGNOSIS — R338 Other retention of urine: Secondary | ICD-10-CM

## 2014-11-23 DIAGNOSIS — G309 Alzheimer's disease, unspecified: Secondary | ICD-10-CM

## 2014-11-23 DIAGNOSIS — K5909 Other constipation: Secondary | ICD-10-CM

## 2014-11-23 NOTE — Progress Notes (Signed)
Patient ID: Kevin [name], male   DOB: 06-09-1925, 80 y.o.   MRN: [id number]  Provider:  Gwenith Spitziffany L. Renato Gailseed, D.O., C.M.D. [facility]   PCP: Bufford SpikesEED, Minal Stuller, DO  Code Status: full code  Allergies  Allergen Reactions  . Morphine Other (See Comments)    Per MAR  . Wellbutrin [Bupropion] Other (See Comments)    [facility]     Chief Complaint  Patient presents with  . Readmit To SNF    HPI: 80 y.o. male with h/o dementia, depression, sleep apnea, vitamin D deficiency, obesity, cardiomyopathy seen for readmission s/p hospitalization 11/19-24 with acute on chronic combined chf class 4.   He also has BPH with urinary retention, constipation, AD, afib with biventricular ICD in place.  He comes to us now on several medications that require regular monitoring.  He has a h/o refusing most meds and blood draws.  So far, he has been taking his meds and allowing blood draws to monitor his INR with a lot of encouragement.  He is also now on dig and amiodarone.  He is to f/u with cardiology in 2 wks.  We have moved his medications so that he gets most during the day when he normally is agreeable to take them.  When seen, he said he didn't feel well, but denied all specific questions and then said he didn't want to talk about it.  He also would not allow me to examine him.    ROS: Review of Systems  Constitutional: Positive for malaise/fatigue.  Respiratory: Negative for shortness of breath.   Cardiovascular: Negative for chest pain.  Gastrointestinal: Negative for abdominal pain and constipation.  Genitourinary: Negative for dysuria.  Musculoskeletal: Negative for joint pain.  Neurological: Positive for weakness.  Endo/Heme/Allergies: Positive for environmental allergies.  Psychiatric/Behavioral: Positive for depression and memory loss.     Past Medical History  Diagnosis Date  . UTI (lower urinary tract infection)   . Cardiomyopathy   . Insomnia   . Obesity   . AICD  (automatic cardioverter/defibrillator) present   . Hypertension   . Vitamin D deficiency   . Sleep apnea     no cpap  . Dementia   . Depressive disorder    Past Surgical History  Procedure Laterality Date  . Total knee arthroplasty      left  . Back surgery    . Cardiac defibrillator placement    . Esophagogastroduodenoscopy  06/28/2012    Procedure: ESOPHAGOGASTRODUODENOSCOPY (EGD);  Surgeon: Petra KubaMarc E Magod, MD;  [facility];  Service: Endoscopy;  Laterality: N/A;   Social History:   reports that he has never smoked. He has never used smokeless tobacco. He reports that he does not drink alcohol or use illicit drugs.  Family History  Problem Relation Age of Onset  . Stroke Mother   . Lung cancer Brother   . Brain cancer Brother     Medications: Patient's Medications  New Prescriptions   No medications on file  Previous Medications   AMIODARONE (PACERONE) 200 MG TABLET    Take 1 tablet (200 mg total) by mouth daily. Start amiodarone 200mg  daily on 12/24/14   AMIODARONE (PACERONE) 400 MG TABLET    Take 1 tablet (400 mg total) by mouth 2 (two) times daily. Take 400mg  2 times daily for 1 week, until 11/22/14, then 400mg  daily x 1 month till 12/23/14.   CALCIUM CARB-CHOLECALCIFEROL (CALCIUM-VITAMIN D) 500-400 MG-UNIT TABS    Take 1 tablet by  mouth daily.   DIGOXIN (LANOXIN) 0.125 MG TABLET    Take 1 tablet (0.125 mg total) by mouth daily.   FUROSEMIDE (LASIX) 40 MG TABLET    Take 1 tablet (40 mg total) by mouth daily.   GUAIFENESIN (ROBITUSSIN) 100 MG/5ML SOLN    Take 10 mLs by mouth every 6 (six) hours as needed for cough (cough).    IRON POLYSACCHARIDES (NIFEREX) 150 MG CAPSULE    Take 150 mg by mouth daily.   LORAZEPAM (ATIVAN) 0.5 MG TABLET    Take 1 tablet (0.5 mg total) by mouth every 8 (eight) hours as needed for anxiety.   MAGNESIUM HYDROXIDE (MILK OF MAGNESIA) 400 MG/5ML SUSPENSION    Take 30 mLs by mouth daily as needed for mild constipation (constipation).     METOPROLOL TARTRATE (LOPRESSOR) 25 MG TABLET    Take 1 tablet (25 mg total) by mouth 2 (two) times daily.   POLYETHYLENE GLYCOL (MIRALAX / GLYCOLAX) PACKET    Take 17 g by mouth daily.   POTASSIUM CHLORIDE SA (K-DUR,KLOR-CON) 20 MEQ TABLET    Take 2 tablets (40 mEq total) by mouth daily.   SENNA (SENOKOT) 8.6 MG TABLET    Take 2 tablets by mouth daily.   TAMSULOSIN HCL (FLOMAX) 0.4 MG CAPS    Take 1 capsule (0.4 mg total) by mouth daily after supper.   TRAMADOL (ULTRAM) 50 MG TABLET    Take 1 tablet (50 mg total) by mouth every 6 (six) hours as needed. For pain   WARFARIN (COUMADIN) 4 MG TABLET    Take 1 tablet (4 mg total) by mouth once.  Modified Medications   No medications on file  Discontinued Medications   No medications on file     Physical Exam: Filed Vitals:   11/23/14 1324  BP: 106/71  Pulse: 86  Temp: 98.2 F (36.8 C)  Resp: 18  Height: 5\' 6"  (1.676 m)  Weight: 144 lb (65.318 kg)  SpO2: 96%  Physical Exam  Constitutional:  Frail white male resting in bed, will not roll over or open his eyes to interact with me  Cardiovascular:  Refused examination completely  Neurological: He is alert.     Labs reviewed: Basic Metabolic Panel:  Recent Labs  09/60/45 1624  11/14/14 0443 11/15/14 0414 11/16/14 0444  NA 139  < > 136* 138 139  K 4.4  < > 3.5* 4.2 4.9  CL 103  < > 101 102 102  CO2 21  < > 25 24 30   GLUCOSE 170*  < > 112* 105* 92  BUN 20  < > 26* 22 17  CREATININE 1.01  < > 1.07 1.05 1.13  CALCIUM 9.4  < > 8.8 8.9 9.3  MG 2.6*  --   --   --   --   < > = values in this interval not displayed. Liver Function Tests:  Recent Labs  07/26/14 11/11/14 1624  AST 18 21  ALT 10 15  ALKPHOS 65 110  BILITOT  --  0.4  PROT  --  7.0  ALBUMIN  --  3.0*   No results for input(s): LIPASE, AMYLASE in the last 8760 hours. No results for input(s): AMMONIA in the last 8760 hours. CBC:  Recent Labs  11/14/14 0443 11/15/14 0414 11/16/14 0444  WBC 5.3 5.0 4.4    HGB 9.6* 10.0* 9.9*  HCT 31.1* 31.8* 31.7*  MCV 81.2 80.5 80.9  PLT 193 207 217   Cardiac Enzymes:  Recent  Labs  11/12/14 0740 11/12/14 1340 11/12/14 1945  TROPONINI <0.30 <0.30 <0.30   Lab Results  Component Value Date   TSH 2.89 07/26/2014    Assessment/Plan 1. Acute combined systolic and diastolic congestive heart failure, NYHA class 4 -continues on lasix and potassium -he tends to be nonadherent and requires lots of encouragement to take his pills -seems exacerbation was due to #2  2. Atrial fibrillation, unspecified -now on amiodarone and dig which require close monitoring as well as coumadin -has been allowing blood draws and taking pills so far -will check levels as below with his next INR  3. Other constipation -likely due to the iron he got started on at the hospital -cont senna, miralax for this  4. Alzheimer's dementia -with behaviors -has h/o refusing pills and exams (as he did today)  5. BPH (benign prostatic hypertrophy) with urinary retention -cont flomax, monitor for orthostasis, increased fall risk and now on coumadin  6. Biventricular ICD (implantable cardioverter-defibrillator) in place f/u with cardiology in 2 wk, Dr. Salena Saner  Functional status:  Dependent in adls  Family/ staff Communication: seen with unit supervisor  Labs/tests ordered:  Cbc, bmp, dig level;  tsh, liver panel due to amiodarone

## 2014-11-24 NOTE — Telephone Encounter (Signed)
Closed encounter °

## 2014-11-26 NOTE — Telephone Encounter (Signed)
Spoke to Charity fundraiser at [employer]. She stated that pt does not have Engineer, materials at the facility. She said that I could sent one to the attention of Arloa Koh. I asked them to set up the transmitter upon arrival and call if they had questions. RN voiced understanding. [facility] customer service called and order placed with customer service rep.

## 2014-12-07 ENCOUNTER — Non-Acute Institutional Stay (SKILLED_NURSING_FACILITY): Payer: Medicare Other | Admitting: Adult Health

## 2014-12-07 DIAGNOSIS — E032 Hypothyroidism due to medicaments and other exogenous substances: Secondary | ICD-10-CM

## 2014-12-07 DIAGNOSIS — Z7901 Long term (current) use of anticoagulants: Secondary | ICD-10-CM

## 2014-12-07 DIAGNOSIS — I4891 Unspecified atrial fibrillation: Secondary | ICD-10-CM

## 2014-12-07 DIAGNOSIS — Z5181 Encounter for therapeutic drug level monitoring: Secondary | ICD-10-CM

## 2014-12-09 ENCOUNTER — Non-Acute Institutional Stay (SKILLED_NURSING_FACILITY): Payer: Medicare Other | Admitting: Adult Health

## 2014-12-09 DIAGNOSIS — I4891 Unspecified atrial fibrillation: Secondary | ICD-10-CM

## 2014-12-09 DIAGNOSIS — G309 Alzheimer's disease, unspecified: Secondary | ICD-10-CM

## 2014-12-09 DIAGNOSIS — F0280 Dementia in other diseases classified elsewhere without behavioral disturbance: Secondary | ICD-10-CM

## 2014-12-19 ENCOUNTER — Encounter: Payer: Self-pay | Admitting: Adult Health

## 2014-12-19 DIAGNOSIS — E039 Hypothyroidism, unspecified: Secondary | ICD-10-CM | POA: Insufficient documentation

## 2014-12-19 DIAGNOSIS — Z5181 Encounter for therapeutic drug level monitoring: Secondary | ICD-10-CM | POA: Insufficient documentation

## 2014-12-19 DIAGNOSIS — Z7901 Long term (current) use of anticoagulants: Secondary | ICD-10-CM

## 2014-12-19 MED ORDER — LEVOTHYROXINE SODIUM 25 MCG PO TABS: 25.0000 ug | ORAL_TABLET | Freq: Every day | ORAL | Status: AC

## 2014-12-19 NOTE — Progress Notes (Signed)
Patient ID: Kevin [name], male   DOB: 1925-04-25, 80 y.o.   MRN: [id number]  [facility]     Allergies  Allergen Reactions  . Morphine Other (See Comments)    Per MAR  . Wellbutrin [Bupropion] Other (See Comments)    [facility]        Chief Complaint  Patient presents with  . Acute Visit    follow up labs; inr management     HPI:  He is being seen for coumadin management.he is on coumadin therapy for the management of her afib. Her inr today is 9.51; and he is taking coumadin 4 mg daily.he is also on amiodarone therapy to help with his heart rate control.  Her tsh is 7.394. He is not on synthroid.   Past Medical History  Diagnosis Date  . UTI (lower urinary tract infection)   . Cardiomyopathy   . Insomnia   . Obesity   . AICD (automatic cardioverter/defibrillator) present   . Hypertension   . Vitamin D deficiency   . Sleep apnea     no cpap  . Dementia   . Depressive disorder     Past Surgical History  Procedure Laterality Date  . Total knee arthroplasty      left  . Back surgery    . Cardiac defibrillator placement    . Esophagogastroduodenoscopy  06/28/2012    Procedure: ESOPHAGOGASTRODUODENOSCOPY (EGD);  Surgeon: Petra KubaMarc E Magod, MD;  [facility];  Service: Endoscopy;  Laterality: N/A;    VITAL SIGNS BP 124/68 mmHg  Pulse 72  Ht 5\' 6"  (1.676 m)  Wt 135 lb (61.236 kg)  BMI 21.80 kg/m2   Outpatient Encounter Prescriptions as of 12/07/2014  Medication Sig  . [START ON 12/24/2014] amiodarone (PACERONE) 200 MG tablet Take 1 tablet (200 mg total) by mouth daily. Start amiodarone 200mg  daily on 12/24/14  . amiodarone (PACERONE) 400 MG tablet Take 1 tablet (400 mg total) by mouth 2 (two) times daily. Take 400mg  2 times daily for 1 week, until 11/22/14, then 400mg  daily x 1 month till 12/23/14.  . Calcium Carb-Cholecalciferol (CALCIUM-VITAMIN D) 500-400 MG-UNIT TABS Take 1 tablet by mouth daily.  . digoxin (LANOXIN) 0.125 MG tablet Take 1 tablet (0.125 mg  total) by mouth daily.  . furosemide (LASIX) 40 MG tablet Take 1 tablet (40 mg total) by mouth daily.  Marland Kitchen. guaiFENesin (ROBITUSSIN) 100 MG/5ML SOLN Take 10 mLs by mouth every 6 (six) hours as needed for cough (cough).   . iron polysaccharides (NIFEREX) 150 MG capsule Take 150 mg by mouth daily.  Marland Kitchen. LORazepam (ATIVAN) 0.5 MG tablet Take 1 tablet (0.5 mg total) by mouth every 8 (eight) hours as needed for anxiety.  . magnesium hydroxide (MILK OF MAGNESIA) 400 MG/5ML suspension Take 30 mLs by mouth daily as needed for mild constipation (constipation).   . metoprolol tartrate (LOPRESSOR) 25 MG tablet Take 1 tablet (25 mg total) by mouth 2 (two) times daily.  . polyethylene glycol (MIRALAX / GLYCOLAX) packet Take 17 g by mouth daily.  . potassium chloride SA (K-DUR,KLOR-CON) 20 MEQ tablet Take 2 tablets (40 mEq total) by mouth daily.  Marland Kitchen. senna (SENOKOT) 8.6 MG tablet Take 2 tablets by mouth daily.  . Tamsulosin HCl (FLOMAX) 0.4 MG CAPS Take 1 capsule (0.4 mg total) by mouth daily after supper.  . traMADol (ULTRAM) 50 MG tablet Take 1 tablet (50 mg total) by mouth every 6 (six) hours as needed. For pain  . warfarin (COUMADIN) 4  MG tablet Take 1 tablet (4 mg total) by mouth once.     SIGNIFICANT DIAGNOSTIC EXAMS   LABS REVIEWED:   11-22-14: glucose 90; bun 24; creat 1.38; k+4.6; na++138 11-26-14: wbc 3.6; hgb 11.8; hct 38; mcv 79.3; plt 320; glucose 129; bun 25; creat 1.24; k+4.7; na++141 liver normal albumin 3.2; tsh 7.394; digoxin 1.6     Review of Systems  Unable to perform ROS    Physical Exam  Constitutional: No distress.  Thin   Neck: Neck supple. No JVD present.  Cardiovascular: Normal rate and intact distal pulses.   Heart rate irregular   Respiratory: Effort normal and breath sounds normal. No respiratory distress.  GI: Soft. Bowel sounds are normal. He exhibits no distension. There is no tenderness.  Musculoskeletal: He exhibits no edema.  Is able to move extremities     Neurological: He is alert.  Skin: Skin is warm and dry. He is not diaphoretic.       ASSESSMENT/ PLAN:  1. Afib/anticoagulation management: his afib is stable; for his inr of 9.51 will give him vit k 2.5 mg daily will hold coumadin today and will check inr in the am will monitor his status.   2. Hypothyroidism; will begin synthroid 25 mcg daily and will check tsh in 6 weeks.     [name] NP [facility]  Contact [phone] Monday through Friday 8am- 5pm  After hours call [phone]

## 2014-12-20 ENCOUNTER — Other Ambulatory Visit: Payer: Self-pay | Admitting: Internal Medicine

## 2014-12-20 LAB — PROTIME-INR
INR: 2 — ABNORMAL HIGH (ref <1.50)
Prothrombin Time: 22.7 seconds — ABNORMAL HIGH (ref 11.6–15.2)

## 2014-12-24 ENCOUNTER — Encounter: Payer: Self-pay | Admitting: Adult Health

## 2014-12-24 MED ORDER — ASPIRIN EC 81 MG PO TBEC: 81.0000 mg | DELAYED_RELEASE_TABLET | Freq: Every day | ORAL | Status: DC

## 2014-12-24 NOTE — Progress Notes (Signed)
Patient ID: Kevin [name], male   DOB: 1925/04/13, 81 y.o.   MRN: [id number]  [name]     Allergies  Allergen Reactions  . Morphine Other (See Comments)    Per MAR  . Wellbutrin [Bupropion] Other (See Comments)    Per [facility]        Chief Complaint  Patient presents with  . Acute Visit    medication review with family     HPI:  He is on coumadin therapy for his afib along with amiodarone and lopressor. His inr is difficult to manage with the amiodarone. He is not a good coumadin patient due to his high risk for falls related to his Alzheimer's disease. I have spoken with his regarding his coumadin therapy. His family does not want him on coumadin therapy due to high risk of bleeding. They as though having his rate under good control with medications and asa therapy should provide him with adequate protection. His family did verbalize understanding of cva risk.   Past Medical History  Diagnosis Date  . UTI (lower urinary tract infection)   . Cardiomyopathy   . Insomnia   . Obesity   . AICD (automatic cardioverter/defibrillator) present   . Hypertension   . Vitamin D deficiency   . Sleep apnea     no cpap  . Dementia   . Depressive disorder     Past Surgical History  Procedure Laterality Date  . Total knee arthroplasty      left  . Back surgery    . Cardiac defibrillator placement    . Esophagogastroduodenoscopy  06/28/2012    Procedure: ESOPHAGOGASTRODUODENOSCOPY (EGD);  Surgeon: Petra Kuba, MD;  Location: [facility] ENDOSCOPY;  Service: Endoscopy;  Laterality: N/A;    VITAL SIGNS BP 90/62 mmHg  Pulse 71  Ht  (1.676 m)  Wt 135 lb (61.236 kg)  BMI 21.80 kg/m2   Outpatient Encounter Prescriptions as of 12/09/2014  Medication Sig  . amiodarone (PACERONE) 200 MG tablet Take 1 tablet (200 mg total) by mouth daily. Start amiodarone  daily on 12/24/14  . amiodarone (PACERONE) 400 MG tablet Take 1 tablet (400 mg total) by mouth 2 (two) times daily. Take  2  times daily for 1 week, until 11/22/14, then  daily x 1 month till 12/23/14.  . Calcium Carb-Cholecalciferol (CALCIUM-VITAMIN D) 500-400 MG-UNIT TABS Take 1 tablet by mouth daily.  . digoxin (LANOXIN) 0.125 MG tablet Take 1 tablet (0.125 mg total) by mouth daily.  . furosemide (LASIX) 40 MG tablet Take 1 tablet (40 mg total) by mouth daily.  Marland Kitchen guaiFENesin (ROBITUSSIN) 100 MG/5ML SOLN Take 10 mLs by mouth every 6 (six) hours as needed for cough (cough).   . iron polysaccharides (NIFEREX) 150 MG capsule Take 150 mg by mouth daily.  Marland Kitchen levothyroxine (LEVOTHROID) 25 MCG tablet Take 1 tablet (25 mcg total) by mouth daily before breakfast.  . LORazepam (ATIVAN) 0.5 MG tablet Take 1 tablet (0.5 mg total) by mouth every 8 (eight) hours as needed for anxiety.  . magnesium hydroxide (MILK OF MAGNESIA) 400 MG/5ML suspension Take 30 mLs by mouth daily as needed for mild constipation (constipation).   . metoprolol tartrate (LOPRESSOR) 25 MG tablet Take 1 tablet (25 mg total) by mouth 2 (two) times daily.  . polyethylene glycol (MIRALAX / GLYCOLAX) packet Take 17 g by mouth daily.  . potassium chloride SA (K-DUR,KLOR-CON) 20 MEQ tablet Take 2 tablets (40 mEq total) by mouth daily.  Marland Kitchen senna (SENOKOT) 8.6  MG tablet Take 2 tablets by mouth daily.  . Tamsulosin HCl (FLOMAX) 0.4 MG CAPS Take 1 capsule (0.4 mg total) by mouth daily after supper.  . traMADol (ULTRAM) 50 MG tablet Take 1 tablet (50 mg total) by mouth every 6 (six) hours as needed. For pain  . warfarin (COUMADIN) 4 MG tablet Take 1 tablet (4 mg total) by mouth once.     SIGNIFICANT DIAGNOSTIC EXAMS   LABS REVIEWED:   11-22-14: glucose 90; bun 24; creat 1.38; k+4.6; na++138 11-26-14: wbc 3.6; hgb 11.8; hct 38; mcv 79.3; plt 320; glucose 129; bun 25; creat 1.24; k+4.7; na++141 liver normal albumin 3.2; tsh 7.394; digoxin 1.6  12-08-14: inr 5.15      Review of Systems  Unable to perform ROS    Physical Exam  Constitutional: No  distress.  Thin   Neck: Neck supple. No JVD present.  Cardiovascular: Normal rate and intact distal pulses.   Heart rate irregular   Respiratory: Effort normal and breath sounds normal. No respiratory distress.  GI: Soft. Bowel sounds are normal. He exhibits no distension. There is no tenderness.  Musculoskeletal: He exhibits no edema.  Is able to move extremities   Neurological: He is alert.  Skin: Skin is warm and dry. He is not diaphoretic.    ASSESSMENT/ PLAN:  1. afib 2. Alzheimer's disease  Will stop the coumadin at this time Will start asa 81 mg daily  Will hold lopressor for systolic b/p <100   Time spent patient 40 minutes.     [name] NP [facility]  Contact [phone] Monday through Friday 8am- 5pm  After hours call [phone]

## 2014-12-28 ENCOUNTER — Non-Acute Institutional Stay (SKILLED_NURSING_FACILITY): Payer: Medicare Other | Admitting: Internal Medicine

## 2014-12-28 DIAGNOSIS — G309 Alzheimer's disease, unspecified: Secondary | ICD-10-CM

## 2014-12-28 DIAGNOSIS — F0280 Dementia in other diseases classified elsewhere without behavioral disturbance: Secondary | ICD-10-CM

## 2014-12-28 DIAGNOSIS — L299 Pruritus, unspecified: Secondary | ICD-10-CM

## 2014-12-28 DIAGNOSIS — F411 Generalized anxiety disorder: Secondary | ICD-10-CM

## 2014-12-30 ENCOUNTER — Non-Acute Institutional Stay (SKILLED_NURSING_FACILITY): Payer: Medicare Other | Admitting: Adult Health

## 2014-12-30 DIAGNOSIS — N400 Enlarged prostate without lower urinary tract symptoms: Secondary | ICD-10-CM

## 2014-12-30 DIAGNOSIS — I5041 Acute combined systolic (congestive) and diastolic (congestive) heart failure: Secondary | ICD-10-CM

## 2014-12-30 DIAGNOSIS — Z95810 Presence of automatic (implantable) cardiac defibrillator: Secondary | ICD-10-CM

## 2014-12-30 DIAGNOSIS — G309 Alzheimer's disease, unspecified: Secondary | ICD-10-CM

## 2014-12-30 DIAGNOSIS — E039 Hypothyroidism, unspecified: Secondary | ICD-10-CM

## 2014-12-30 DIAGNOSIS — N401 Enlarged prostate with lower urinary tract symptoms: Secondary | ICD-10-CM

## 2014-12-30 DIAGNOSIS — R338 Other retention of urine: Secondary | ICD-10-CM

## 2014-12-30 DIAGNOSIS — F0280 Dementia in other diseases classified elsewhere without behavioral disturbance: Secondary | ICD-10-CM

## 2014-12-30 DIAGNOSIS — I4891 Unspecified atrial fibrillation: Secondary | ICD-10-CM

## 2014-12-30 DIAGNOSIS — K5909 Other constipation: Secondary | ICD-10-CM

## 2014-12-30 DIAGNOSIS — F419 Anxiety disorder, unspecified: Secondary | ICD-10-CM

## 2015-01-03 ENCOUNTER — Encounter: Payer: Self-pay | Admitting: Internal Medicine

## 2015-01-03 MED ORDER — DIPHENHYDRAMINE HCL 25 MG PO TABS: 25.0000 mg | ORAL_TABLET | Freq: Two times a day (BID) | ORAL | Status: DC | PRN

## 2015-01-03 NOTE — Progress Notes (Signed)
Patient ID: Kevin [name], male   DOB: 1925/06/03, 81 y.o.   MRN: [id number]    [place]     Place of Service: SNF (31)    Allergies  Allergen Reactions  . Morphine Other (See Comments)    Per MAR  . Wellbutrin [Bupropion] Other (See Comments)    Per [facility]     Chief Complaint  Patient presents with  . Acute Visit    itching; rash    HPI:  81 yo male long term resident seen for an acute visit for above. He has been off lamictal x several days. Rash resolved but he continues to itch. No f/c. No known insect bites.  Medications: Patient's Medications  New Prescriptions   No medications on file  Previous Medications   AMIODARONE (PACERONE) 200 MG TABLET    Take 1 tablet (200 mg total) by mouth daily. Start amiodarone $RemoveBeforeDE'200mg'pFywRfTVcMEbvBg$  daily on 12/24/14   AMIODARONE (PACERONE) 400 MG TABLET    Take 1 tablet (400 mg total) by mouth 2 (two) times daily. Take $RemoveBef'400mg'pTzYdiCTct$  2 times daily for 1 week, until 11/22/14, then $RemoveBefore'400mg'URrPiRQHUkhiQ$  daily x 1 month till 12/23/14.   ASPIRIN EC 81 MG TABLET    Take 1 tablet (81 mg total) by mouth daily.   CALCIUM CARB-CHOLECALCIFEROL (CALCIUM-VITAMIN D) 500-400 MG-UNIT TABS    Take 1 tablet by mouth daily.   DIGOXIN (LANOXIN) 0.125 MG TABLET    Take 1 tablet (0.125 mg total) by mouth daily.   FUROSEMIDE (LASIX) 40 MG TABLET    Take 1 tablet (40 mg total) by mouth daily.   GUAIFENESIN (ROBITUSSIN) 100 MG/5ML SOLN    Take 10 mLs by mouth every 6 (six) hours as needed for cough (cough).    IRON POLYSACCHARIDES (NIFEREX) 150 MG CAPSULE    Take 150 mg by mouth daily.   LEVOTHYROXINE (LEVOTHROID) 25 MCG TABLET    Take 1 tablet (25 mcg total) by mouth daily before breakfast.   LORAZEPAM (ATIVAN) 0.5 MG TABLET    Take 1 tablet (0.5 mg total) by mouth every 8 (eight) hours as needed for anxiety.   MAGNESIUM HYDROXIDE (MILK OF MAGNESIA) 400 MG/5ML SUSPENSION    Take 30 mLs by mouth daily as needed for mild constipation (constipation).    METOPROLOL TARTRATE  (LOPRESSOR) 25 MG TABLET    Take 1 tablet (25 mg total) by mouth 2 (two) times daily.   POLYETHYLENE GLYCOL (MIRALAX / GLYCOLAX) PACKET    Take 17 g by mouth daily.   POTASSIUM CHLORIDE SA (K-DUR,KLOR-CON) 20 MEQ TABLET    Take 2 tablets (40 mEq total) by mouth daily.   SENNA (SENOKOT) 8.6 MG TABLET    Take 2 tablets by mouth daily.   TAMSULOSIN HCL (FLOMAX) 0.4 MG CAPS    Take 1 capsule (0.4 mg total) by mouth daily after supper.   TRAMADOL (ULTRAM) 50 MG TABLET    Take 1 tablet (50 mg total) by mouth every 6 (six) hours as needed. For pain  Modified Medications   No medications on file  Discontinued Medications   No medications on file     Review of Systems   As above. He is unable provide any further hx due to dementia  Filed Vitals:   12/28/14 1502  BP: 121/67  Pulse: 82  Temp: 97.6 F (36.4 C)  Weight: 130 lb (58.968 kg)  SpO2: 96%   Body mass index is 20.99 kg/(m^2).  Physical Exam GEN: looks frail in NAD  SKIN: no rash noted but excoriations present  Labs reviewed: Orders Only on 12/20/2014  Component Date Value Ref Range Status  . Prothrombin Time 12/20/2014 22.7* 11.6 - 15.2 seconds Final  . INR 12/20/2014 2.00* <1.50 Final   Comment: The INR is of principal utility in following patients on stable doses of oral anticoagulants.  The therapeutic range is generally 2.0 to 3.0, but may be 3.0 to 4.0 in patients with mechanical cardiac valves, recurrent embolisms and antiphospholipid antibodies (including lupus inhibitors).   Nursing Home on 11/23/2014  Component Date Value Ref Range Status  . Glucose 11/22/2014 90   Final  . BUN 11/22/2014 24* 4 - 21 mg/dL Final  . Creatinine 11/22/2014 1.4* 0.6 - 1.3 mg/dL Final  . Potassium 11/22/2014 4.6  3.4 - 5.3 mmol/L Final  . Sodium 11/22/2014 138  137 - 147 mmol/L Final  Admission on 11/11/2014, Discharged on 11/16/2014  Component Date Value Ref Range Status  . Sodium 11/11/2014 143  137 - 147 mEq/L Final  . Potassium  11/11/2014 5.2  3.7 - 5.3 mEq/L Final  . Chloride 11/11/2014 107  96 - 112 mEq/L Final  . CO2 11/11/2014 22  19 - 32 mEq/L Final  . Glucose, Bld 11/11/2014 171* 70 - 99 mg/dL Final  . BUN 11/11/2014 20  6 - 23 mg/dL Final  . Creatinine, Ser 11/11/2014 0.93  0.50 - 1.35 mg/dL Final  . Calcium 11/11/2014 9.6  8.4 - 10.5 mg/dL Final  . GFR calc non Af Amer 11/11/2014 74* >90 mL/min Final  . GFR calc Af Amer 11/11/2014 86* >90 mL/min Final   Comment: (NOTE) The eGFR has been calculated using the CKD EPI equation. This calculation has not been validated in all clinical situations. eGFR's persistently <90 mL/min signify possible Chronic Kidney Disease.   . Anion gap 11/11/2014 14  5 - 15 Final  . WBC 11/11/2014 6.3  4.0 - 10.5 K/uL Final  . RBC 11/11/2014 4.16* 4.22 - 5.81 MIL/uL Final  . Hemoglobin 11/11/2014 10.7* 13.0 - 17.0 g/dL Final  . HCT 11/11/2014 34.2* 39.0 - 52.0 % Final  . MCV 11/11/2014 82.2  78.0 - 100.0 fL Final  . MCH 11/11/2014 25.7* 26.0 - 34.0 pg Final  . MCHC 11/11/2014 31.3  30.0 - 36.0 g/dL Final  . RDW 11/11/2014 18.1* 11.5 - 15.5 % Final  . Platelets 11/11/2014 249  150 - 400 K/uL Final  . Troponin i, poc 11/11/2014 0.07  0.00 - 0.08 ng/mL Final  . Comment 3 11/11/2014          Final   Comment: Due to the release kinetics of cTnI, a negative result within the first hours of the onset of symptoms does not rule out myocardial infarction with certainty. If myocardial infarction is still suspected, repeat the test at appropriate intervals.   . Pro B Natriuretic peptide (BNP) 11/11/2014 15640.0* 0 - 450 pg/mL Final  . Sodium 11/11/2014 139  137 - 147 mEq/L Final  . Potassium 11/11/2014 4.4  3.7 - 5.3 mEq/L Final  . Chloride 11/11/2014 103  96 - 112 mEq/L Final  . CO2 11/11/2014 21  19 - 32 mEq/L Final  . Glucose, Bld 11/11/2014 170* 70 - 99 mg/dL Final  . BUN 11/11/2014 20  6 - 23 mg/dL Final  . Creatinine, Ser 11/11/2014 1.01  0.50 - 1.35 mg/dL Final  . Calcium  11/11/2014 9.4  8.4 - 10.5 mg/dL Final  . Total Protein 11/11/2014 7.0  6.0 - 8.3  g/dL Final  . Albumin 11/11/2014 3.0* 3.5 - 5.2 g/dL Final  . AST 11/11/2014 21  0 - 37 U/L Final  . ALT 11/11/2014 15  0 - 53 U/L Final  . Alkaline Phosphatase 11/11/2014 110  39 - 117 U/L Final  . Total Bilirubin 11/11/2014 0.4  0.3 - 1.2 mg/dL Final  . GFR calc non Af Amer 11/11/2014 65* >90 mL/min Final  . GFR calc Af Amer 11/11/2014 76* >90 mL/min Final   Comment: (NOTE) The eGFR has been calculated using the CKD EPI equation. This calculation has not been validated in all clinical situations. eGFR's persistently <90 mL/min signify possible Chronic Kidney Disease.   . Anion gap 11/11/2014 15  5 - 15 Final  . aPTT 11/11/2014 35  24 - 37 seconds Final  . Prothrombin Time 11/11/2014 16.3* 11.6 - 15.2 seconds Final  . INR 11/11/2014 1.30  0.00 - 1.49 Final  . Magnesium 11/11/2014 2.6* 1.5 - 2.5 mg/dL Final  . Pro B Natriuretic peptide (BNP) 11/11/2014 15842.0* 0 - 450 pg/mL Final  . Sodium 11/12/2014 138  137 - 147 mEq/L Final  . Potassium 11/12/2014 4.6  3.7 - 5.3 mEq/L Final  . Chloride 11/12/2014 104  96 - 112 mEq/L Final  . CO2 11/12/2014 21  19 - 32 mEq/L Final  . Glucose, Bld 11/12/2014 168* 70 - 99 mg/dL Final  . BUN 11/12/2014 25* 6 - 23 mg/dL Final  . Creatinine, Ser 11/12/2014 1.01  0.50 - 1.35 mg/dL Final  . Calcium 11/12/2014 9.5  8.4 - 10.5 mg/dL Final  . GFR calc non Af Amer 11/12/2014 65* >90 mL/min Final  . GFR calc Af Amer 11/12/2014 76* >90 mL/min Final   Comment: (NOTE) The eGFR has been calculated using the CKD EPI equation. This calculation has not been validated in all clinical situations. eGFR's persistently <90 mL/min signify possible Chronic Kidney Disease.   . Anion gap 11/12/2014 13  5 - 15 Final  . Troponin I 11/11/2014 <0.30  <0.30 ng/mL Final   Comment:        Due to the release kinetics of cTnI, a negative result within the first hours of the onset of symptoms  does not rule out myocardial infarction with certainty. If myocardial infarction is still suspected, repeat the test at appropriate intervals.   Marland Kitchen MRSA by PCR 11/11/2014 POSITIVE* NEGATIVE Final   Comment:        The GeneXpert MRSA Assay (FDA approved for NASAL specimens only), is one component of a comprehensive MRSA colonization surveillance program. It is not intended to diagnose MRSA infection nor to guide or monitor treatment for MRSA infections. RESULT CALLED TO, READ BACK BY AND VERIFIED WITH: L.REINERT,RN AT 2207 ON 11/11/14 BY SHEAW   . Heparin Unfractionated 11/12/2014 0.26* 0.30 - 0.70 IU/mL Final   Comment:        IF HEPARIN RESULTS ARE BELOW EXPECTED VALUES, AND PATIENT DOSAGE HAS BEEN CONFIRMED, SUGGEST FOLLOW UP TESTING OF ANTITHROMBIN III LEVELS.   . WBC 11/12/2014 3.3* 4.0 - 10.5 K/uL Final  . RBC 11/12/2014 4.03* 4.22 - 5.81 MIL/uL Final  . Hemoglobin 11/12/2014 10.1* 13.0 - 17.0 g/dL Final  . HCT 11/12/2014 32.8* 39.0 - 52.0 % Final  . MCV 11/12/2014 81.4  78.0 - 100.0 fL Final  . MCH 11/12/2014 25.1* 26.0 - 34.0 pg Final  . MCHC 11/12/2014 30.8  30.0 - 36.0 g/dL Final  . RDW 11/12/2014 18.0* 11.5 - 15.5 % Final  . Platelets  11/12/2014 219  150 - 400 K/uL Final  . Troponin I 11/12/2014 <0.30  <0.30 ng/mL Final   Comment:        Due to the release kinetics of cTnI, a negative result within the first hours of the onset of symptoms does not rule out myocardial infarction with certainty. If myocardial infarction is still suspected, repeat the test at appropriate intervals.   . Troponin I 11/12/2014 <0.30  <0.30 ng/mL Final   Comment:        Due to the release kinetics of cTnI, a negative result within the first hours of the onset of symptoms does not rule out myocardial infarction with certainty. If myocardial infarction is still suspected, repeat the test at appropriate intervals.   . Troponin I 11/12/2014 <0.30  <0.30 ng/mL Final   Comment:         Due to the release kinetics of cTnI, a negative result within the first hours of the onset of symptoms does not rule out myocardial infarction with certainty. If myocardial infarction is still suspected, repeat the test at appropriate intervals.   . Heparin Unfractionated 11/12/2014 0.22* 0.30 - 0.70 IU/mL Final   Comment:        IF HEPARIN RESULTS ARE BELOW EXPECTED VALUES, AND PATIENT DOSAGE HAS BEEN CONFIRMED, SUGGEST FOLLOW UP TESTING OF ANTITHROMBIN III LEVELS.   Marland Kitchen Heparin Unfractionated 11/12/2014 0.54  0.30 - 0.70 IU/mL Final   Comment:        IF HEPARIN RESULTS ARE BELOW EXPECTED VALUES, AND PATIENT DOSAGE HAS BEEN CONFIRMED, SUGGEST FOLLOW UP TESTING OF ANTITHROMBIN III LEVELS.   Marland Kitchen Sodium 11/13/2014 134* 137 - 147 mEq/L Final  . Potassium 11/13/2014 4.3  3.7 - 5.3 mEq/L Final  . Chloride 11/13/2014 100  96 - 112 mEq/L Final  . CO2 11/13/2014 21  19 - 32 mEq/L Final  . Glucose, Bld 11/13/2014 126* 70 - 99 mg/dL Final  . BUN 11/13/2014 29* 6 - 23 mg/dL Final  . Creatinine, Ser 11/13/2014 1.17  0.50 - 1.35 mg/dL Final  . Calcium 11/13/2014 9.2  8.4 - 10.5 mg/dL Final  . GFR calc non Af Amer 11/13/2014 55* >90 mL/min Final  . GFR calc Af Amer 11/13/2014 63* >90 mL/min Final   Comment: (NOTE) The eGFR has been calculated using the CKD EPI equation. This calculation has not been validated in all clinical situations. eGFR's persistently <90 mL/min signify possible Chronic Kidney Disease.   . Anion gap 11/13/2014 13  5 - 15 Final  . Heparin Unfractionated 11/13/2014 0.70  0.30 - 0.70 IU/mL Final   Comment:        IF HEPARIN RESULTS ARE BELOW EXPECTED VALUES, AND PATIENT DOSAGE HAS BEEN CONFIRMED, SUGGEST FOLLOW UP TESTING OF ANTITHROMBIN III LEVELS.   . WBC 11/13/2014 7.1  4.0 - 10.5 K/uL Final  . RBC 11/13/2014 3.97* 4.22 - 5.81 MIL/uL Final  . Hemoglobin 11/13/2014 10.1* 13.0 - 17.0 g/dL Final  . HCT 11/13/2014 31.7* 39.0 - 52.0 % Final  . MCV 11/13/2014 79.8   78.0 - 100.0 fL Final  . MCH 11/13/2014 25.4* 26.0 - 34.0 pg Final  . MCHC 11/13/2014 31.9  30.0 - 36.0 g/dL Final  . RDW 11/13/2014 17.8* 11.5 - 15.5 % Final  . Platelets 11/13/2014 223  150 - 400 K/uL Final  . Heparin Unfractionated 11/13/2014 0.70  0.30 - 0.70 IU/mL Final   Comment:        IF HEPARIN RESULTS ARE BELOW EXPECTED VALUES, AND  PATIENT DOSAGE HAS BEEN CONFIRMED, SUGGEST FOLLOW UP TESTING OF ANTITHROMBIN III LEVELS.   Marland Kitchen Sodium 11/14/2014 136* 137 - 147 mEq/L Final  . Potassium 11/14/2014 3.5* 3.7 - 5.3 mEq/L Final   DELTA CHECK NOTED  . Chloride 11/14/2014 101  96 - 112 mEq/L Final  . CO2 11/14/2014 25  19 - 32 mEq/L Final  . Glucose, Bld 11/14/2014 112* 70 - 99 mg/dL Final  . BUN 11/14/2014 26* 6 - 23 mg/dL Final  . Creatinine, Ser 11/14/2014 1.07  0.50 - 1.35 mg/dL Final  . Calcium 11/14/2014 8.8  8.4 - 10.5 mg/dL Final  . GFR calc non Af Amer 11/14/2014 61* >90 mL/min Final  . GFR calc Af Amer 11/14/2014 70* >90 mL/min Final   Comment: (NOTE) The eGFR has been calculated using the CKD EPI equation. This calculation has not been validated in all clinical situations. eGFR's persistently <90 mL/min signify possible Chronic Kidney Disease.   . Anion gap 11/14/2014 10  5 - 15 Final  . Heparin Unfractionated 11/14/2014 0.48  0.30 - 0.70 IU/mL Final   Comment:        IF HEPARIN RESULTS ARE BELOW EXPECTED VALUES, AND PATIENT DOSAGE HAS BEEN CONFIRMED, SUGGEST FOLLOW UP TESTING OF ANTITHROMBIN III LEVELS.   . WBC 11/14/2014 5.3  4.0 - 10.5 K/uL Final  . RBC 11/14/2014 3.83* 4.22 - 5.81 MIL/uL Final  . Hemoglobin 11/14/2014 9.6* 13.0 - 17.0 g/dL Final  . HCT 11/14/2014 31.1* 39.0 - 52.0 % Final  . MCV 11/14/2014 81.2  78.0 - 100.0 fL Final  . MCH 11/14/2014 25.1* 26.0 - 34.0 pg Final  . MCHC 11/14/2014 30.9  30.0 - 36.0 g/dL Final  . RDW 11/14/2014 18.0* 11.5 - 15.5 % Final  . Platelets 11/14/2014 193  150 - 400 K/uL Final  . Heparin Unfractionated 11/15/2014  0.41  0.30 - 0.70 IU/mL Final   Comment:        IF HEPARIN RESULTS ARE BELOW EXPECTED VALUES, AND PATIENT DOSAGE HAS BEEN CONFIRMED, SUGGEST FOLLOW UP TESTING OF ANTITHROMBIN III LEVELS.   . WBC 11/15/2014 5.0  4.0 - 10.5 K/uL Final  . RBC 11/15/2014 3.95* 4.22 - 5.81 MIL/uL Final  . Hemoglobin 11/15/2014 10.0* 13.0 - 17.0 g/dL Final  . HCT 11/15/2014 31.8* 39.0 - 52.0 % Final  . MCV 11/15/2014 80.5  78.0 - 100.0 fL Final  . MCH 11/15/2014 25.3* 26.0 - 34.0 pg Final  . MCHC 11/15/2014 31.4  30.0 - 36.0 g/dL Final  . RDW 11/15/2014 17.7* 11.5 - 15.5 % Final  . Platelets 11/15/2014 207  150 - 400 K/uL Final  . Sodium 11/15/2014 138  137 - 147 mEq/L Final  . Potassium 11/15/2014 4.2  3.7 - 5.3 mEq/L Final  . Chloride 11/15/2014 102  96 - 112 mEq/L Final  . CO2 11/15/2014 24  19 - 32 mEq/L Final  . Glucose, Bld 11/15/2014 105* 70 - 99 mg/dL Final  . BUN 11/15/2014 22  6 - 23 mg/dL Final  . Creatinine, Ser 11/15/2014 1.05  0.50 - 1.35 mg/dL Final  . Calcium 11/15/2014 8.9  8.4 - 10.5 mg/dL Final  . GFR calc non Af Amer 11/15/2014 62* >90 mL/min Final  . GFR calc Af Amer 11/15/2014 72* >90 mL/min Final   Comment: (NOTE) The eGFR has been calculated using the CKD EPI equation. This calculation has not been validated in all clinical situations. eGFR's persistently <90 mL/min signify possible Chronic Kidney Disease.   . Anion gap  11/15/2014 12  5 - 15 Final  . Heparin Unfractionated 11/16/2014 0.64  0.30 - 0.70 IU/mL Final   Comment:        IF HEPARIN RESULTS ARE BELOW EXPECTED VALUES, AND PATIENT DOSAGE HAS BEEN CONFIRMED, SUGGEST FOLLOW UP TESTING OF ANTITHROMBIN III LEVELS.   . WBC 11/16/2014 4.4  4.0 - 10.5 K/uL Final  . RBC 11/16/2014 3.92* 4.22 - 5.81 MIL/uL Final  . Hemoglobin 11/16/2014 9.9* 13.0 - 17.0 g/dL Final  . HCT 11/16/2014 31.7* 39.0 - 52.0 % Final  . MCV 11/16/2014 80.9  78.0 - 100.0 fL Final  . MCH 11/16/2014 25.3* 26.0 - 34.0 pg Final  . MCHC 11/16/2014 31.2   30.0 - 36.0 g/dL Final  . RDW 11/16/2014 17.7* 11.5 - 15.5 % Final  . Platelets 11/16/2014 217  150 - 400 K/uL Final  . Prothrombin Time 11/16/2014 15.5* 11.6 - 15.2 seconds Final  . INR 11/16/2014 1.22  0.00 - 1.49 Final  . Sodium 11/16/2014 139  137 - 147 mEq/L Final  . Potassium 11/16/2014 4.9  3.7 - 5.3 mEq/L Final  . Chloride 11/16/2014 102  96 - 112 mEq/L Final  . CO2 11/16/2014 30  19 - 32 mEq/L Final  . Glucose, Bld 11/16/2014 92  70 - 99 mg/dL Final  . BUN 11/16/2014 17  6 - 23 mg/dL Final  . Creatinine, Ser 11/16/2014 1.13  0.50 - 1.35 mg/dL Final  . Calcium 11/16/2014 9.3  8.4 - 10.5 mg/dL Final  . GFR calc non Af Amer 11/16/2014 56* >90 mL/min Final  . GFR calc Af Amer 11/16/2014 65* >90 mL/min Final   Comment: (NOTE) The eGFR has been calculated using the CKD EPI equation. This calculation has not been validated in all clinical situations. eGFR's persistently <90 mL/min signify possible Chronic Kidney Disease.   . Anion gap 11/16/2014 7  5 - 15 Final  Nursing Home on 10/14/2014  Component Date Value Ref Range Status  . Hemoglobin 07/26/2014 10.6* 13.5 - 17.5 g/dL Final  . HCT 07/26/2014 33* 41 - 53 % Final  . Platelets 07/26/2014 179  150 - 399 K/L Final  . WBC 07/26/2014 4.2   Final  . Glucose 07/26/2014 91   Final  . BUN 07/26/2014 18  4 - 21 mg/dL Final  . Creatinine 07/26/2014 1.0  0.6 - 1.3 mg/dL Final  . Potassium 07/26/2014 4.1  3.4 - 5.3 mmol/L Final  . Sodium 07/26/2014 142  137 - 147 mmol/L Final  . Alkaline Phosphatase 07/26/2014 65  25 - 125 U/L Final  . ALT 07/26/2014 10  10 - 40 U/L Final  . AST 07/26/2014 18  14 - 40 U/L Final  . TSH 07/26/2014 2.89  0.41 - 5.90 uIU/mL Final     Assessment/Plan    ICD-9-CM ICD-10-CM   1. Pruritus 698.9 L29.9   2. Alzheimer's dementia 331.0 G30.9    294.10 F02.80   3. Anxiety state 300.00 F41.1     - itching probably related to uncontrolled anxiety- meds adjusted. Benadryl already ordered  - continue all  other medications except lamictal as ordered  [facility]. Perlie Gold  [facility] [address] [place], [place] [address] [phone] Office (Wednesdays and Fridays 8 AM - 5 PM) [phone] Cell (Monday-Friday 8 AM - 5 PM)

## 2015-01-11 ENCOUNTER — Encounter: Payer: Self-pay | Admitting: *Deleted

## 2015-01-12 ENCOUNTER — Other Ambulatory Visit: Payer: Self-pay | Admitting: Internal Medicine

## 2015-01-12 LAB — BASIC METABOLIC PANEL

## 2015-01-14 ENCOUNTER — Encounter: Payer: Self-pay | Admitting: Adult Health

## 2015-01-14 DIAGNOSIS — F419 Anxiety disorder, unspecified: Secondary | ICD-10-CM | POA: Insufficient documentation

## 2015-01-14 NOTE — Progress Notes (Signed)
Patient ID: Kevin [name], male   DOB: October 12, 1925, 81 y.o.   MRN: [id number]  [name] living [place]     Allergies  Allergen Reactions  . Morphine Other (See Comments)    Per MAR  . Wellbutrin [Bupropion] Other (See Comments)    Per [facility]        Chief Complaint  Patient presents with  . Medical Management of Chronic Issues    HPI:  He is a long term resident of this facility being seen for the management of his chronic illnesses. He has been losing weight. In Dec 2015: his weight was 143 pounds his current weight is 128 pounds. The nursing staff feels as though his ativan dose is too high at 1 mg twice daily with 0.5 mg every 8 hours as needed.  He is unable to fully participate in the hpi or ros.    Past Medical History  Diagnosis Date  . UTI (lower urinary tract infection)   . Nonischemic cardiomyopathy   . Insomnia   . Obesity   . AICD (automatic cardioverter/defibrillator) present   . Hypertension   . Vitamin D deficiency   . Sleep apnea     no cpap  . Dementia   . Depressive disorder   . LBBB (left bundle branch block)     Past Surgical History  Procedure Laterality Date  . Total knee arthroplasty      left  . Back surgery    . Cardiac defibrillator placement      [facility] Jude  . Esophagogastroduodenoscopy  06/28/2012    Procedure: ESOPHAGOGASTRODUODENOSCOPY (EGD);  Surgeon: Petra Kuba, MD;  Location: [facility] ENDOSCOPY;  Service: Endoscopy;  Laterality: N/A;  . Hernia repair      VITAL SIGNS BP 133/65 mmHg  Pulse 82  Ht 5\' 6"  (1.676 m)  Wt 128 lb (58.06 kg)  BMI 20.67 kg/m2  SpO2 96%   Outpatient Encounter Prescriptions as of 12/30/2014  Medication Sig  . amiodarone (PACERONE) 200 MG tablet Take 1 tablet (200 mg total) by mouth daily. Start amiodarone 200mg  daily on 12/24/14  . amiodarone (PACERONE) 400 MG tablet Take 1 tablet (400 mg total) by mouth 2 (two) times daily. Take 400mg  2 times daily for 1 week, until 11/22/14, then 400mg  daily x 1 month till  12/23/14.  Marland Kitchen aspirin EC 81 MG tablet Take 1 tablet (81 mg total) by mouth daily.  . Calcium Carb-Cholecalciferol (CALCIUM-VITAMIN D) 500-400 MG-UNIT TABS Take 1 tablet by mouth daily.  . digoxin (LANOXIN) 0.125 MG tablet Take 1 tablet (0.125 mg total) by mouth daily.  . diphenhydrAMINE (BENADRYL) 25 MG tablet Take 1 tablet (25 mg total) by mouth 2 (two) times daily as needed for itching.  . furosemide (LASIX) 40 MG tablet Take 1 tablet (40 mg total) by mouth daily.  Marland Kitchen guaiFENesin (ROBITUSSIN) 100 MG/5ML SOLN Take 10 mLs by mouth every 6 (six) hours as needed for cough (cough).   . iron polysaccharides (NIFEREX) 150 MG capsule Take 150 mg by mouth daily.  Marland Kitchen levothyroxine (LEVOTHROID) 25 MCG tablet Take 1 tablet (25 mcg total) by mouth daily before breakfast.  . LORazepam (ATIVAN) 0.5 MG tablet Take 1 tablet (0.5 mg total) by mouth every 8 (eight) hours as needed for anxiety. And 1 mg twice daily   . magnesium hydroxide (MILK OF MAGNESIA) 400 MG/5ML suspension Take 30 mLs by mouth daily as needed for mild constipation (constipation).   . metoprolol tartrate (LOPRESSOR) 25 MG tablet Take 1 tablet (  25 mg total) by mouth 2 (two) times daily.  . polyethylene glycol (MIRALAX / GLYCOLAX) packet Take 17 g by mouth daily.  . potassium chloride SA (K-DUR,KLOR-CON) 20 MEQ tablet Take 2 tablets (40 mEq total) by mouth daily.  Marland Kitchen senna (SENOKOT) 8.6 MG tablet Take 2 tablets by mouth daily.  . Tamsulosin HCl (FLOMAX) 0.4 MG CAPS Take 1 capsule (0.4 mg total) by mouth daily after supper.  . traMADol (ULTRAM) 50 MG tablet Take 1 tablet (50 mg total) by mouth every 6 (six) hours as needed. For pain     SIGNIFICANT DIAGNOSTIC EXAMS    LABS REVIEWED:   11-22-14: glucose 90; bun 24; creat 1.38; k+4.6; na++138 11-26-14: wbc 3.6; hgb 11.8; hct 38; mcv 79.3; plt 320; glucose 129; bun 25; creat 1.24; k+4.7; na++141 liver normal albumin 3.2; tsh 7.394; digoxin 1.6  12-08-14: inr 5.15     Review of Systems    Unable to perform ROS    Physical Exam Constitutional: No distress.  Thin   Neck: Neck supple. No JVD present.  Cardiovascular: Normal rate and intact distal pulses.   Heart rate irregular   Respiratory: Effort normal and breath sounds normal. No respiratory distress.  GI: Soft. Bowel sounds are normal. He exhibits no distension. There is no tenderness.  Musculoskeletal: He exhibits no edema.  Is able to move extremities   Neurological: He is alert.  Skin: Skin is warm and dry. He is not diaphoretic.      ASSESSMENT/ PLAN:  1. Afib: his heart rate is stable; will continue amiodarone 200 mg daily; digoxin 0.125 mg daily lopressor 25 mg twice daily for rate control asa 81 mg daily will monitor  2. Diastolic heart failure: is stable will lower his lasix to 20 mg daily with k+ 20 mg daily will monitor his status. He is status post icd placement will check bmp in 2 weeks  3. Hypothyroidism: will continue synthroid 25 mcg daily   4. Alzheimer's disease: his disease state is advanced he is not taking medications at this time; will not make changes will monitor his status.   5. Bph: will continue flomax 0.4 mg daily   6. Anemia: will continue nu-iron daily hgb is 11.8   7. Constipation: will continue miralax daily; senna 2 tabs daily   8. Anxiety: will change his ativan to 0.5 mg twice daily and every 8 hours as needed; will monitor his status and make further changes as indicated.   [name] NP [facility]  Contact [phone] Monday through Friday 8am- 5pm  After hours call [phone]

## 2015-01-19 ENCOUNTER — Other Ambulatory Visit: Payer: Self-pay | Admitting: Internal Medicine

## 2015-01-19 LAB — TSH

## 2015-01-20 ENCOUNTER — Encounter: Payer: Medicare Other | Admitting: Cardiovascular Disease

## 2015-01-24 ENCOUNTER — Non-Acute Institutional Stay (SKILLED_NURSING_FACILITY): Payer: Medicare Other | Admitting: Adult Health

## 2015-01-24 DIAGNOSIS — F0280 Dementia in other diseases classified elsewhere without behavioral disturbance: Secondary | ICD-10-CM

## 2015-01-24 DIAGNOSIS — K5909 Other constipation: Secondary | ICD-10-CM

## 2015-01-24 DIAGNOSIS — R338 Other retention of urine: Secondary | ICD-10-CM

## 2015-01-24 DIAGNOSIS — F419 Anxiety disorder, unspecified: Secondary | ICD-10-CM

## 2015-01-24 DIAGNOSIS — E039 Hypothyroidism, unspecified: Secondary | ICD-10-CM

## 2015-01-24 DIAGNOSIS — G309 Alzheimer's disease, unspecified: Secondary | ICD-10-CM | POA: Diagnosis not present

## 2015-01-24 DIAGNOSIS — I5041 Acute combined systolic (congestive) and diastolic (congestive) heart failure: Secondary | ICD-10-CM

## 2015-01-24 DIAGNOSIS — I482 Chronic atrial fibrillation, unspecified: Secondary | ICD-10-CM

## 2015-01-24 DIAGNOSIS — N401 Enlarged prostate with lower urinary tract symptoms: Secondary | ICD-10-CM

## 2015-01-24 DIAGNOSIS — Z95810 Presence of automatic (implantable) cardiac defibrillator: Secondary | ICD-10-CM | POA: Diagnosis not present

## 2015-01-24 DIAGNOSIS — N400 Enlarged prostate without lower urinary tract symptoms: Secondary | ICD-10-CM | POA: Diagnosis not present

## 2015-01-26 ENCOUNTER — Telehealth: Payer: Self-pay | Admitting: Cardiovascular Disease

## 2015-01-26 NOTE — Telephone Encounter (Signed)
01/26/15 left vm with daughter to sched f/u w/ dr. Salena Saner; ah

## 2015-02-13 ENCOUNTER — Encounter: Payer: Self-pay | Admitting: Adult Health

## 2015-02-13 NOTE — Progress Notes (Signed)
Patient ID: Kevin [name], male   DOB: Mar 01, 1925, 81 y.o.   MRN: [id number]  [name] [place]     Allergies  Allergen Reactions  . Morphine Other (See Comments)    Per MAR  . Wellbutrin [Bupropion] Other (See Comments)    Per [facility]        Chief Complaint  Patient presents with  . Medical Management of Chronic Issues    HPI:  He is a long term resident of this facility being seen for the management of his chronic illnesses. Overall there is little change in his status. He cannot fully participate in the hpi or ros. There are no nursing concerns today.    Past Medical History  Diagnosis Date  . UTI (lower urinary tract infection)   . Nonischemic cardiomyopathy   . Insomnia   . Obesity   . AICD (automatic cardioverter/defibrillator) present   . Hypertension   . Vitamin D deficiency   . Sleep apnea     no cpap  . Dementia   . Depressive disorder   . LBBB (left bundle branch block)     Past Surgical History  Procedure Laterality Date  . Total knee arthroplasty      left  . Back surgery    . Cardiac defibrillator placement      [facility] Jude  . Esophagogastroduodenoscopy  06/28/2012    Procedure: ESOPHAGOGASTRODUODENOSCOPY (EGD);  Surgeon: Petra Kuba, MD;  Location: [facility] ENDOSCOPY;  Service: Endoscopy;  Laterality: N/A;  . Hernia repair      VITAL SIGNS BP 118/68 mmHg  Pulse 73  Ht  (1.676 m)  Wt 128 lb (58.06 kg)  BMI 20.67 kg/m2  SpO2 96%   Outpatient Encounter Prescriptions as of 01/24/2015  Medication Sig  . amiodarone (PACERONE) 200 MG tablet Take 1 tablet (200 mg total) by mouth daily. Start amiodarone  daily on 12/24/14  . aspirin EC 81 MG tablet Take 1 tablet (81 mg total) by mouth daily.  . Calcium Carb-Cholecalciferol (CALCIUM-VITAMIN D) 500-400 MG-UNIT TABS Take 1 tablet by mouth daily.  . digoxin (LANOXIN) 0.125 MG tablet Take 1 tablet (0.125 mg total) by mouth daily.  . diphenhydrAMINE (BENADRYL) 25 MG tablet Take 1 tablet (25 mg total)  by mouth 2 (two) times daily as needed for itching.  . furosemide (LASIX) 40 MG tablet Take 1 tablet (40 mg total) by mouth daily. (Patient taking differently: Take 20 mg by mouth daily. )  . guaiFENesin (ROBITUSSIN) 100 MG/5ML SOLN Take 10 mLs by mouth every 6 (six) hours as needed for cough (cough).   . iron polysaccharides (NIFEREX) 150 MG capsule Take 150 mg by mouth daily.  Marland Kitchen levothyroxine (LEVOTHROID) 25 MCG tablet Take 1 tablet (25 mcg total) by mouth daily before breakfast.  . LORazepam (ATIVAN) 0.5 MG tablet Patient taking differently: Take 0.5 mg by mouth 2 (two) times daily. And every 8 hours as needed  . magnesium hydroxide (MILK OF MAGNESIA) 400 MG/5ML suspension Take 30 mLs by mouth daily as needed for mild constipation (constipation).   . metoprolol tartrate (LOPRESSOR) 25 MG tablet Take 1 tablet (25 mg total) by mouth 2 (two) times daily.  . polyethylene glycol (MIRALAX / GLYCOLAX) packet Take 17 g by mouth daily.  . potassium chloride SA (K-DUR,KLOR-CON) 20 MEQ tablet  Take 20 mEq by mouth daily.  Marland Kitchen senna (SENOKOT) 8.6 MG tablet Take 2 tablets by mouth daily.  . Tamsulosin HCl (FLOMAX) 0.4 MG CAPS Take 1 capsule (0.4  mg total) by mouth daily after supper.  . traMADol (ULTRAM) 50 MG tablet Take 1 tablet (50 mg total) by mouth every 6 (six) hours as needed. For pain     SIGNIFICANT DIAGNOSTIC EXAMS   LABS REVIEWED: WILL DECLINE LABS    11-22-14: glucose 90; bun 24; creat 1.38; k+4.6; na++138 11-26-14: wbc 3.6; hgb 11.8; hct 38; mcv 79.3; plt 320; glucose 129; bun 25; creat 1.24; k+4.7; na++141 liver normal albumin 3.2; tsh 7.394; digoxin 1.6  12-08-14: inr 5.15     Review of Systems  Unable to perform ROS    Physical Exam Constitutional: No distress.  Thin   Neck: Neck supple. No JVD present.  Cardiovascular: Normal rate and intact distal pulses.   Heart rate irregular   Respiratory: Effort normal and breath sounds normal. No respiratory distress.  GI: Soft. Bowel  sounds are normal. He exhibits no distension. There is no tenderness.  Musculoskeletal: He exhibits no edema.  Is able to move extremities   Neurological: He is alert.  Skin: Skin is warm and dry. He is not diaphoretic.    ASSESSMENT/ PLAN:  1. Afib: his heart rate is stable; will continue amiodarone 200 mg daily; digoxin 0.125 mg daily lopressor 25 mg twice daily for rate control asa 81 mg daily will monitor  2. Diastolic heart failure: is stable will continue  lasix  20 mg daily with k+ 20 mg daily will monitor his status. He is status post icd placement   3. Hypothyroidism: will continue synthroid 25 mcg daily   4. Alzheimer's disease: his disease state is advanced he is not taking medications at this time; will not make changes will monitor his status.   5. Bph: will continue flomax 0.4 mg daily   6. Anemia: will continue nu-iron daily hgb is 11.8   7. Constipation: will continue miralax daily; senna 2 tabs daily   8. Anxiety: will continue  ativan  0.5 mg twice daily and every 8 hours as needed; will monitor his status and make further changes as indicated.       [name] NP [facility]  Contact [phone] Monday through Friday 8am- 5pm  After hours call [phone]

## 2015-03-04 ENCOUNTER — Non-Acute Institutional Stay (SKILLED_NURSING_FACILITY): Payer: Medicare Other | Admitting: Adult Health

## 2015-03-04 DIAGNOSIS — I482 Chronic atrial fibrillation, unspecified: Secondary | ICD-10-CM

## 2015-03-04 DIAGNOSIS — N401 Enlarged prostate with lower urinary tract symptoms: Secondary | ICD-10-CM

## 2015-03-04 DIAGNOSIS — I10 Essential (primary) hypertension: Secondary | ICD-10-CM | POA: Diagnosis not present

## 2015-03-04 DIAGNOSIS — I5041 Acute combined systolic (congestive) and diastolic (congestive) heart failure: Secondary | ICD-10-CM | POA: Diagnosis not present

## 2015-03-04 DIAGNOSIS — F0280 Dementia in other diseases classified elsewhere without behavioral disturbance: Secondary | ICD-10-CM | POA: Diagnosis not present

## 2015-03-04 DIAGNOSIS — N400 Enlarged prostate without lower urinary tract symptoms: Secondary | ICD-10-CM | POA: Diagnosis not present

## 2015-03-04 DIAGNOSIS — R338 Other retention of urine: Secondary | ICD-10-CM

## 2015-03-04 DIAGNOSIS — G309 Alzheimer's disease, unspecified: Secondary | ICD-10-CM | POA: Diagnosis not present

## 2015-03-04 DIAGNOSIS — F419 Anxiety disorder, unspecified: Secondary | ICD-10-CM | POA: Diagnosis not present

## 2015-03-04 DIAGNOSIS — K5909 Other constipation: Secondary | ICD-10-CM | POA: Diagnosis not present

## 2015-03-28 ENCOUNTER — Non-Acute Institutional Stay (SKILLED_NURSING_FACILITY): Payer: Medicare Other | Admitting: Adult Health

## 2015-03-28 DIAGNOSIS — N390 Urinary tract infection, site not specified: Secondary | ICD-10-CM

## 2015-04-04 ENCOUNTER — Non-Acute Institutional Stay (SKILLED_NURSING_FACILITY): Payer: Medicare Other | Admitting: Adult Health

## 2015-04-04 DIAGNOSIS — F0280 Dementia in other diseases classified elsewhere without behavioral disturbance: Secondary | ICD-10-CM

## 2015-04-04 DIAGNOSIS — I482 Chronic atrial fibrillation, unspecified: Secondary | ICD-10-CM

## 2015-04-04 DIAGNOSIS — F419 Anxiety disorder, unspecified: Secondary | ICD-10-CM | POA: Diagnosis not present

## 2015-04-04 DIAGNOSIS — K5909 Other constipation: Secondary | ICD-10-CM | POA: Diagnosis not present

## 2015-04-04 DIAGNOSIS — E039 Hypothyroidism, unspecified: Secondary | ICD-10-CM | POA: Diagnosis not present

## 2015-04-04 DIAGNOSIS — G309 Alzheimer's disease, unspecified: Secondary | ICD-10-CM | POA: Diagnosis not present

## 2015-04-04 DIAGNOSIS — I5041 Acute combined systolic (congestive) and diastolic (congestive) heart failure: Secondary | ICD-10-CM

## 2015-04-04 DIAGNOSIS — N400 Enlarged prostate without lower urinary tract symptoms: Secondary | ICD-10-CM

## 2015-04-04 DIAGNOSIS — N401 Enlarged prostate with lower urinary tract symptoms: Secondary | ICD-10-CM

## 2015-04-04 DIAGNOSIS — I10 Essential (primary) hypertension: Secondary | ICD-10-CM

## 2015-04-04 DIAGNOSIS — R338 Other retention of urine: Secondary | ICD-10-CM

## 2015-04-25 ENCOUNTER — Encounter: Payer: Self-pay | Admitting: Adult Health

## 2015-04-25 DIAGNOSIS — I10 Essential (primary) hypertension: Secondary | ICD-10-CM | POA: Insufficient documentation

## 2015-04-25 NOTE — Progress Notes (Signed)
Patient ID: Kevin [name], male   DOB: Apr 20, 1925, 81 y.o.   MRN: [id number]  [name] living [place]     Allergies  Allergen Reactions  . Morphine Other (See Comments)    Per MAR  . Wellbutrin [Bupropion] Other (See Comments)    Per [facility]        Chief Complaint  Patient presents with  . Medical Management of Chronic Issues    HPI:  He is a long term resident of this facility being seen for the management of his chronic illnesses. Overall there is little change in his status. He is unable to fully participate in the hpi or ros. His blood pressure is elevated.   Past Medical History  Diagnosis Date  . UTI (lower urinary tract infection)   . Nonischemic cardiomyopathy   . Insomnia   . Obesity   . AICD (automatic cardioverter/defibrillator) present   . Hypertension   . Vitamin D deficiency   . Sleep apnea     no cpap  . Dementia   . Depressive disorder   . LBBB (left bundle branch block)     Past Surgical History  Procedure Laterality Date  . Total knee arthroplasty      left  . Back surgery    . Cardiac defibrillator placement      [facility] Jude  . Esophagogastroduodenoscopy  06/28/2012    Procedure: ESOPHAGOGASTRODUODENOSCOPY (EGD);  Surgeon: Petra Kuba, MD;  Location: [facility] ENDOSCOPY;  Service: Endoscopy;  Laterality: N/A;  . Hernia repair      VITAL SIGNS BP 152/70 mmHg  Pulse 72  Ht  (1.676 m)  Wt 136 lb (61.689 kg)  BMI 21.96 kg/m2   Outpatient Encounter Prescriptions as of 03/04/2015   Medication Sig  . amiodarone (PACERONE) 200 MG tablet Take 1 tablet (200 mg total) by mouth daily.   abilify 2 mg  2 mg daily   . aspirin EC 81 MG tablet Take 1 tablet (81 mg total) by mouth daily.  . Calcium Carb-Cholecalciferol (CALCIUM-VITAMIN D) 500-400 MG-UNIT TABS Take 1 tablet by mouth daily.  . digoxin (LANOXIN) 0.125 MG tablet Take 1 tablet (0.125 mg total) by mouth daily.  . diphenhydrAMINE (BENADRYL) 25 MG tablet Take 1 tablet (25 mg total) by mouth 2 (two)  times daily as needed for itching.  . furosemide (LASIX) 40 MG tablet Take 20 mg by mouth daily.   Marland Kitchen guaiFENesin (ROBITUSSIN) 100 MG/5ML SOLN Take 10 mLs by mouth every 6 (six) hours as needed for cough (cough).   . iron polysaccharides (NIFEREX) 150 MG capsule Take 150 mg by mouth daily.  Marland Kitchen levothyroxine (LEVOTHROID) 25 MCG tablet Take 1 tablet (25 mcg total) by mouth daily before breakfast.  . LORazepam (ATIVAN) 0.5 MG tablet Patient taking differently: Take 0.5 mg by mouth 2 (two) times daily. And every 8 hours as needed  . magnesium hydroxide (MILK OF MAGNESIA) 400 MG/5ML suspension Take 30 mLs by mouth daily as needed for mild constipation (constipation).   . metoprolol tartrate (LOPRESSOR) 25 MG tablet Take 1 tablet (25 mg total) by mouth 2 (two) times daily.  . polyethylene glycol (MIRALAX / GLYCOLAX) packet Take 17 g by mouth daily.  . potassium chloride SA (K-DUR,KLOR-CON) 20 MEQ tablet  Take 20 mEq by mouth daily.   Marland Kitchen senna (SENOKOT) 8.6 MG tablet Take 2 tablets by mouth daily.  . Tamsulosin HCl (FLOMAX) 0.4 MG CAPS Take 1 capsule (0.4 mg total) by mouth daily after supper.  Marland Kitchen  traMADol (ULTRAM) 50 MG tablet Take 1 tablet (50 mg total) by mouth every 6 (six) hours as needed. For pain      SIGNIFICANT DIAGNOSTIC EXAMS   LABS REVIEWED: WILL DECLINE LABS    11-22-14: glucose 90; bun 24; creat 1.38; k+4.6; na++138 11-26-14: wbc 3.6; hgb 11.8; hct 38; mcv 79.3; plt 320; glucose 129; bun 25; creat 1.24; k+4.7; na++141 liver normal albumin 3.2; tsh 7.394; digoxin 1.6  12-08-14: inr 5.15    ROS Unable to perform ROS     Physical Exam Constitutional: No distress.  Thin   Neck: Neck supple. No JVD present.  Cardiovascular: Normal rate and intact distal pulses.   Heart rate irregular   Respiratory: Effort normal and breath sounds normal. No respiratory distress.  GI: Soft. Bowel sounds are normal. He exhibits no distension. There is no tenderness.  Musculoskeletal: He exhibits  no edema.  Is able to move extremities   Neurological: He is alert.  Skin: Skin is warm and dry. He is not diaphoretic.    ASSESSMENT/ PLAN:  1. Afib: his heart rate is stable; will continue amiodarone 200 mg daily; digoxin 0.125 mg daily lopressor 25 mg twice daily for rate control asa 81 mg daily will monitor  2. Diastolic heart failure: is stable will continue  lasix  20 mg daily with k+ 20 mg daily will monitor his status. He is status post icd placement   3. Hypothyroidism: will continue synthroid 25 mcg daily   4. Alzheimer's disease: his disease state is advanced he is not taking medications at this time; will not make changes will monitor his status.   5. Bph: will continue flomax 0.4 mg daily   6. Anemia: will continue nu-iron daily hgb is 11.8   7. Constipation: will continue miralax daily; senna 2 tabs daily   8. Anxiety: will continue  ativan  0.5 mg twice daily and every 8 hours as needed;will continue abilify 2 mg daily  will monitor his status and make further changes as indicated.   9. Hypertension: will continue lopressor 25 mg twice daily  And will begin norvasc 2.5 mg daily and will have nursing check blood pressure twice daily     [name] NP [facility]  Contact [phone] Monday through Friday 8am- 5pm  After hours call [phone]

## 2015-05-09 ENCOUNTER — Non-Acute Institutional Stay (SKILLED_NURSING_FACILITY): Payer: Medicare Other | Admitting: Adult Health

## 2015-05-09 DIAGNOSIS — I482 Chronic atrial fibrillation, unspecified: Secondary | ICD-10-CM

## 2015-05-09 DIAGNOSIS — G309 Alzheimer's disease, unspecified: Secondary | ICD-10-CM

## 2015-05-09 DIAGNOSIS — K5909 Other constipation: Secondary | ICD-10-CM

## 2015-05-09 DIAGNOSIS — E039 Hypothyroidism, unspecified: Secondary | ICD-10-CM | POA: Diagnosis not present

## 2015-05-09 DIAGNOSIS — I10 Essential (primary) hypertension: Secondary | ICD-10-CM | POA: Diagnosis not present

## 2015-05-09 DIAGNOSIS — I5041 Acute combined systolic (congestive) and diastolic (congestive) heart failure: Secondary | ICD-10-CM

## 2015-05-09 DIAGNOSIS — N400 Enlarged prostate without lower urinary tract symptoms: Secondary | ICD-10-CM

## 2015-05-09 DIAGNOSIS — F419 Anxiety disorder, unspecified: Secondary | ICD-10-CM | POA: Diagnosis not present

## 2015-05-09 DIAGNOSIS — F0280 Dementia in other diseases classified elsewhere without behavioral disturbance: Secondary | ICD-10-CM

## 2015-05-09 DIAGNOSIS — N401 Enlarged prostate with lower urinary tract symptoms: Secondary | ICD-10-CM

## 2015-05-09 DIAGNOSIS — R338 Other retention of urine: Secondary | ICD-10-CM

## 2015-05-30 LAB — HM DIABETES EYE EXAM

## 2015-06-05 DIAGNOSIS — N390 Urinary tract infection, site not specified: Secondary | ICD-10-CM | POA: Insufficient documentation

## 2015-06-05 NOTE — Progress Notes (Signed)
Patient ID: Kevin [name], male   DOB: February 03, 1925, 81 y.o.   MRN: [id number]  [name]      Allergies  Allergen Reactions  . Morphine Other (See Comments)    Per MAR  . Wellbutrin [Bupropion] Other (See Comments)    Per [facility]        Chief Complaint  Patient presents with  . Acute Visit    uti    HPI:  He is beig seen for his UTI in which his urine culture grew e-coli. His hallucinations have increased is now off the abilify. His family has voiced concerns about his use of abilify and felt as though he may have a uti.    Past Medical History  Diagnosis Date  . UTI (lower urinary tract infection)   . Nonischemic cardiomyopathy   . Insomnia   . Obesity   . AICD (automatic cardioverter/defibrillator) present   . Hypertension   . Vitamin D deficiency   . Sleep apnea     no cpap  . Dementia   . Depressive disorder   . LBBB (left bundle branch block)     Past Surgical History  Procedure Laterality Date  . Total knee arthroplasty      left  . Back surgery    . Cardiac defibrillator placement      [facility] Jude  . Esophagogastroduodenoscopy  06/28/2012    Procedure: ESOPHAGOGASTRODUODENOSCOPY (EGD);  Surgeon: Petra Kuba, MD;  Location: [facility] ENDOSCOPY;  Service: Endoscopy;  Laterality: N/A;  . Hernia repair      VITAL SIGNS BP 119/73 mmHg  Pulse 73  Ht 5\' 6"  (1.676 m)  Wt 140 lb (63.504 kg)  BMI 22.61 kg/m2   Outpatient Encounter Prescriptions as of 03/28/2015  Medication Sig  . amiodarone (PACERONE) 200 MG tablet Take 1 tablet (200 mg total) by mouth daily. Start amiodarone 200mg  daily on 12/24/14  . aspirin EC 81 MG tablet Take 1 tablet (81 mg total) by mouth daily.  . Calcium Carb-Cholecalciferol (CALCIUM-VITAMIN D) 500-400 MG-UNIT TABS Take 1 tablet by mouth daily.  . digoxin (LANOXIN) 0.125 MG tablet Take 1 tablet (0.125 mg total) by mouth daily.  . diphenhydrAMINE (BENADRYL) 25 MG tablet Take 1 tablet (25 mg total) by mouth 2 (two) times daily as  needed for itching.  . furosemide (LASIX) 40 MG tablet Take Take 20 mg by mouth daily. )  . guaiFENesin (ROBITUSSIN) 100 MG/5ML SOLN Take 10 mLs by mouth every 6 (six) hours as needed for cough (cough).   . iron polysaccharides (NIFEREX) 150 MG capsule Take 150 mg by mouth daily.  Marland Kitchen levothyroxine (LEVOTHROID) 25 MCG tablet Take 1 tablet (25 mcg total) by mouth daily before breakfast.  . LORazepam (ATIVAN) 0.5 MG tablet  Take 0.5 mg by mouth 2 (two) times daily. And every 8 hours as needed)  . magnesium hydroxide (MILK OF MAGNESIA) 400 MG/5ML suspension Take 30 mLs by mouth daily as needed for mild constipation (constipation).   . metoprolol tartrate (LOPRESSOR) 25 MG tablet Take 1 tablet (25 mg total) by mouth 2 (two) times daily.  . polyethylene glycol (MIRALAX / GLYCOLAX) packet Take 17 g by mouth daily.  . potassium chloride SA (K-DUR,KLOR-CON) 20 MEQ tablet Take 2 tablets (40 mEq total) by mouth daily. (Patient taking differently: Take 20 mEq by mouth daily. )  . senna (SENOKOT) 8.6 MG tablet Take 2 tablets by mouth daily.  . Tamsulosin HCl (FLOMAX) 0.4 MG CAPS Take 1 capsule (0.4 mg total)  by mouth daily after supper.  . traMADol (ULTRAM) 50 MG tablet Take 1 tablet (50 mg total) by mouth every 6 (six) hours as needed. For pain      SIGNIFICANT DIAGNOSTIC EXAMS  LABS REVIEWED: WILL DECLINE LABS    11-22-14: glucose 90; bun 24; creat 1.38; k+4.6; na++138 11-26-14: wbc 3.6; hgb 11.8; hct 38; mcv 79.3; plt 320; glucose 129; bun 25; creat 1.24; k+4.7; na++141 liver normal albumin 3.2; tsh 7.394; digoxin 1.6  12-08-14: inr 5.15  03-28-15: urine culture: e-coli: rocephin      ROS Unable to perform ROS      Physical Exam Constitutional: No distress.  Thin   Neck: Neck supple. No JVD present.  Cardiovascular: Normal rate and intact distal pulses.   Heart rate irregular   Respiratory: Effort normal and breath sounds normal. No respiratory distress.  GI: Soft. Bowel sounds are  normal. He exhibits no distension. There is no tenderness.  Musculoskeletal: He exhibits no edema.  Is able to move extremities   Neurological: He is alert.  Skin: Skin is warm and dry. He is not diaphoretic.    ASSESSMENT/ PLAN:  1. UTI: will begin him on rocephin 1 gm daily for 5 days and will continue to monitor his status.    [name] NP [facility]  Contact [phone] Monday through Friday 8am- 5pm  After hours call [phone]

## 2015-06-13 NOTE — Progress Notes (Signed)
Patient ID: Kevin [name], male   DOB: 07-09-1925, 81 y.o.   MRN: [id number]  [name] living [place]     Allergies  Allergen Reactions  . Morphine Other (See Comments)    Per MAR  . Wellbutrin [Bupropion] Other (See Comments)    Per [facility]        Chief Complaint  Patient presents with  . Medical Management of Chronic Issues    HPI:  He is a long term resident of this facility being seen for the management of his chronic illnesses. Overall there is little change in his status. He is unable to fully participate in the hpi or ros. There are no nursing concerns at this time. He was treated for an uti earlier this month without complications    Past Medical History  Diagnosis Date  . UTI (lower urinary tract infection)   . Nonischemic cardiomyopathy   . Insomnia   . Obesity   . AICD (automatic cardioverter/defibrillator) present   . Hypertension   . Vitamin D deficiency   . Sleep apnea     no cpap  . Dementia   . Depressive disorder   . LBBB (left bundle branch block)     Past Surgical History  Procedure Laterality Date  . Total knee arthroplasty      left  . Back surgery    . Cardiac defibrillator placement      [facility] Jude  . Esophagogastroduodenoscopy  06/28/2012    Procedure: ESOPHAGOGASTRODUODENOSCOPY (EGD);  Surgeon: Petra Kuba, MD;  Location: [facility] ENDOSCOPY;  Service: Endoscopy;  Laterality: N/A;  . Hernia repair      VITAL SIGNS BP 116/60 mmHg  Pulse 70  Ht  (1.676 m)  Wt 140 lb (63.504 kg)  BMI 22.61 kg/m2   Outpatient Encounter Prescriptions as of 04/04/2015  Medication Sig   norvasc 2.5 mg Take 2.5 mg daily   . amiodarone (PACERONE) 200 MG tablet Take 1 tablet (200 mg total) by mouth daily.   Marland Kitchen aspirin EC 81 MG tablet Take 1 tablet (81 mg total) by mouth daily.  . Calcium Carb-Cholecalciferol (CALCIUM-VITAMIN D) 500-400 MG-UNIT TABS Take 1 tablet by mouth daily.  . digoxin (LANOXIN) 0.125 MG tablet Take 1 tablet (0.125 mg total) by mouth daily.    . diphenhydrAMINE (BENADRYL) 25 MG tablet Take 1 tablet (25 mg total) by mouth 2 (two) times daily as needed for itching.  . furosemide (LASIX) 40 MG tablet Take 20 mg by mouth daily. )  . guaiFENesin (ROBITUSSIN) 100 MG/5ML SOLN Take 10 mLs by mouth every 6 (six) hours as needed for cough (cough).   . iron polysaccharides (NIFEREX) 150 MG capsule Take 150 mg by mouth daily.  Marland Kitchen levothyroxine (LEVOTHROID) 25 MCG tablet Take 1 tablet (25 mcg total) by mouth daily before breakfast.  . LORazepam (ATIVAN) 0.5 MG tablet : Take 0.5 mg by mouth 2 (two) times daily. And every 8 hours as needed)  . magnesium hydroxide (MILK OF MAGNESIA) 400 MG/5ML suspension Take 30 mLs by mouth daily as needed for mild constipation (constipation).   . metoprolol tartrate (LOPRESSOR) 25 MG tablet Take 1 tablet (25 mg total) by mouth 2 (two) times daily.  . polyethylene glycol (MIRALAX / GLYCOLAX) packet Take 17 g by mouth daily.  . potassium chloride SA (K-DUR,KLOR-CON) 20 MEQ tablet  Take 40 mEq by mouth daily. )  . senna (SENOKOT) 8.6 MG tablet Take 2 tablets by mouth daily.  . Tamsulosin HCl (FLOMAX) 0.4 MG  CAPS Take 1 capsule (0.4 mg total) by mouth daily after supper.  . traMADol (ULTRAM) 50 MG tablet Take 1 tablet (50 mg total) by mouth every 6 (six) hours as needed. For pain      SIGNIFICANT DIAGNOSTIC EXAMS   LABS REVIEWED: WILL DECLINE LABS    11-22-14: glucose 90; bun 24; creat 1.38; k+4.6; na++138 11-26-14: wbc 3.6; hgb 11.8; hct 38; mcv 79.3; plt 320; glucose 129; bun 25; creat 1.24; k+4.7; na++141 liver normal albumin 3.2; tsh 7.394; digoxin 1.6  12-08-14: inr 5.15  03-28-15: urine culture: e-coli: rocephin     Review of Systems  Unable to perform ROS    Physical Exam Constitutional: No distress.  Thin   Neck: Neck supple. No JVD present.  Cardiovascular: Normal rate and intact distal pulses.   Heart rate irregular   Respiratory: Effort normal and breath sounds normal. No respiratory  distress.  GI: Soft. Bowel sounds are normal. He exhibits no distension. There is no tenderness.  Musculoskeletal: He exhibits no edema.  Is able to move extremities   Neurological: He is alert.  Skin: Skin is warm and dry. He is not diaphoretic.      ASSESSMENT/ PLAN:  1. Afib: his heart rate is stable; will continue amiodarone 200 mg daily; digoxin 0.125 mg daily lopressor 25 mg twice daily for rate control asa 81 mg daily will monitor  2. Diastolic heart failure: is stable will continue  lasix  20 mg daily with k+ 20 mg daily will monitor his status. He is status post icd placement   3. Hypothyroidism: will continue synthroid 25 mcg daily   4. Alzheimer's disease: his disease state is advanced he is not taking medications at this time; will not make changes will monitor his status.   5. Bph: will continue flomax 0.4 mg daily   6. Anemia: will continue nu-iron daily hgb is 11.8   7. Constipation: will continue miralax daily; senna 2 tabs daily   8. Anxiety: will continue  ativan  0.5 mg twice daily and every 8 hours as needed his abilify was stopped; will not make changes will monitor  9. Hypertension: will continue lopressor 25 mg twice daily norvasc 2.5 mg daily will monitor    Will attempt cbc; cmp; dig and tsh   [name] NP [facility]  Contact [phone] Monday through Friday 8am- 5pm  After hours call [phone]

## 2015-06-20 ENCOUNTER — Non-Acute Institutional Stay (SKILLED_NURSING_FACILITY): Payer: Medicare Other | Admitting: Adult Health

## 2015-06-20 DIAGNOSIS — I5041 Acute combined systolic (congestive) and diastolic (congestive) heart failure: Secondary | ICD-10-CM

## 2015-06-20 DIAGNOSIS — I10 Essential (primary) hypertension: Secondary | ICD-10-CM

## 2015-06-20 DIAGNOSIS — K5909 Other constipation: Secondary | ICD-10-CM | POA: Diagnosis not present

## 2015-06-20 DIAGNOSIS — N400 Enlarged prostate without lower urinary tract symptoms: Secondary | ICD-10-CM | POA: Diagnosis not present

## 2015-06-20 DIAGNOSIS — I482 Chronic atrial fibrillation, unspecified: Secondary | ICD-10-CM

## 2015-06-20 DIAGNOSIS — F419 Anxiety disorder, unspecified: Secondary | ICD-10-CM

## 2015-06-20 DIAGNOSIS — N401 Enlarged prostate with lower urinary tract symptoms: Secondary | ICD-10-CM

## 2015-06-20 DIAGNOSIS — G309 Alzheimer's disease, unspecified: Secondary | ICD-10-CM

## 2015-06-20 DIAGNOSIS — F0280 Dementia in other diseases classified elsewhere without behavioral disturbance: Secondary | ICD-10-CM

## 2015-06-20 DIAGNOSIS — E039 Hypothyroidism, unspecified: Secondary | ICD-10-CM | POA: Diagnosis not present

## 2015-06-20 DIAGNOSIS — R338 Other retention of urine: Secondary | ICD-10-CM

## 2015-06-27 ENCOUNTER — Encounter: Payer: Self-pay | Admitting: Adult Health

## 2015-06-27 NOTE — Progress Notes (Signed)
Patient ID: Kevin [name], male   DOB: [birth year], 81 y.o.   MRN: [id number]  [name]      Allergies  Allergen Reactions  . Morphine Other (See Comments)    Per MAR  . Wellbutrin [Bupropion] Other (See Comments)    Per [facility]        Chief Complaint  Patient presents with  . Medical Management of Chronic Issues    HPI:  He is a long term resident of this facility being seen for the management of his chronic illnesses. Overall there is little change in his status. He is unable to participate in the hpi or ros. There are no nursing concerns at this time.    Past Medical History  Diagnosis Date  . UTI (lower urinary tract infection)   . Nonischemic cardiomyopathy   . Insomnia   . Obesity   . AICD (automatic cardioverter/defibrillator) present   . Hypertension   . Vitamin D deficiency   . Sleep apnea     no cpap  . Dementia   . Depressive disorder   . LBBB (left bundle branch block)     Past Surgical History  Procedure Laterality Date  . Total knee arthroplasty      left  . Back surgery    . Cardiac defibrillator placement      ST Jude  . Esophagogastroduodenoscopy  06/28/2012    Procedure: ESOPHAGOGASTRODUODENOSCOPY (EGD);  Surgeon: Petra Kuba, MD;  Location: [facility] ENDOSCOPY;  Service: Endoscopy;  Laterality: N/A;  . Hernia repair      VITAL SIGNS BP 112/70 mmHg  Pulse 80  Ht 5\' 6"  (1.676 m)  Wt 142 lb (64.411 kg)  BMI 22.93 kg/m2   Outpatient Encounter Prescriptions as of 05/09/2015  Medication Sig  . amiodarone (PACERONE) 200 MG tablet Take 1 tablet (200 mg total) by mouth daily. Start amiodarone 200mg  daily on 12/24/14  . amLODipine (NORVASC) 2.5 MG tablet Take 2.5 mg by mouth daily.  Marland Kitchen aspirin EC 81 MG tablet Take 1 tablet (81 mg total) by mouth daily.  . Calcium Carb-Cholecalciferol (CALCIUM-VITAMIN D) 500-400 MG-UNIT TABS Take 1 tablet by mouth daily.  . digoxin (LANOXIN) 0.125 MG tablet Take 1 tablet (0.125 mg total) by mouth daily.  .  diphenhydrAMINE (BENADRYL) 25 MG tablet Take 1 tablet (25 mg total) by mouth 2 (two) times daily as needed for itching.  . furosemide (LASIX) 40 MG tablet Take 1 tablet (40 mg total) by mouth daily. (Patient taking differently: Take 20 mg by mouth daily. )  . guaiFENesin (ROBITUSSIN) 100 MG/5ML SOLN Take 10 mLs by mouth every 6 (six) hours as needed for cough (cough).   . iron polysaccharides (NIFEREX) 150 MG capsule Take 150 mg by mouth daily.  Marland Kitchen levothyroxine (LEVOTHROID) 25 MCG tablet Take 1 tablet (25 mcg total) by mouth daily before breakfast.  . LORazepam (ATIVAN) 0.5 MG tablet  Take 0.5 mg by mouth 2 (two) times daily. And every 8 hours as needed)  . magnesium hydroxide (MILK OF MAGNESIA) 400 MG/5ML suspension Take 30 mLs by mouth daily as needed for mild constipation (constipation).   . metoprolol tartrate (LOPRESSOR) 25 MG tablet Take 1 tablet (25 mg total) by mouth 2 (two) times daily.  . polyethylene glycol (MIRALAX / GLYCOLAX) packet Take 17 g by mouth daily.  . potassium chloride SA (K-DUR,KLOR-CON) 20 MEQ tablet Take 2 tablets (40 mEq total) by mouth daily. (Patient taking differently: Take 20 mEq by mouth daily. )  .  senna (SENOKOT) 8.6 MG tablet Take 2 tablets by mouth daily.  . Tamsulosin HCl (FLOMAX) 0.4 MG CAPS Take 1 capsule (0.4 mg total) by mouth daily after supper.  . traMADol (ULTRAM) 50 MG tablet Take 1 tablet (50 mg total) by mouth every 6 (six) hours as needed. For pain      SIGNIFICANT DIAGNOSTIC EXAMS  LABS REVIEWED: WILL DECLINE LABS    11-22-14: glucose 90; bun 24; creat 1.38; k+4.6; na++138 11-26-14: wbc 3.6; hgb 11.8; hct 38; mcv 79.3; plt 320; glucose 129; bun 25; creat 1.24; k+4.7; na++141 liver normal albumin 3.2; tsh 7.394; digoxin 1.6  12-08-14: inr 5.15  03-28-15: urine culture: e-coli: rocephin 04-06-15: wbc 4.1; hgb 10.4; hct 33.4; mcv 81.3; plt 310; glucose 80; bun 22; creat 1.10; k+4.2; na++139; liver normal albumin 3.0; tsh 4.405; dig 0.7       ROS Unable to perform ROS    Physical Exam Constitutional: No distress.  Thin   Neck: Neck supple. No JVD present.  Cardiovascular: Normal rate and intact distal pulses.   Heart rate irregular   Respiratory: Effort normal and breath sounds normal. No respiratory distress.  GI: Soft. Bowel sounds are normal. He exhibits no distension. There is no tenderness.  Musculoskeletal: He exhibits no edema.  Is able to move extremities   Neurological: He is alert.  Skin: Skin is warm and dry. He is not diaphoretic.     ASSESSMENT/ PLAN:   1. Afib: his heart rate is stable; will continue amiodarone 200 mg daily; digoxin 0.125 mg daily lopressor 25 mg twice daily for rate control asa 81 mg daily will monitor dig level is 0.7   2. Diastolic heart failure: is stable will continue  lasix  20 mg daily with k+ 20 mg daily will monitor his status. He is status post icd placement   3. Hypothyroidism: will continue synthroid 25 mcg daily  tsh is 4.405   4. Alzheimer's disease: his disease state is advanced he is not taking medications at this time; will not make changes will monitor his status.   5. Bph: will continue flomax 0.4 mg daily   6. Anemia: will continue nu-iron daily hgb is 10.4   7. Constipation: will continue miralax daily; senna 2 tabs daily   8. Anxiety: will continue  ativan  0.5 mg twice daily and every 8 hours as needed his abilify was stopped; will not make changes will monitor  9. Hypertension: will continue lopressor 25 mg twice daily norvasc 2.5 mg daily will monitor     [name] NP [facility]  Contact [phone] Monday through Friday 8am- 5pm  After hours call [phone]

## 2015-06-28 ENCOUNTER — Other Ambulatory Visit: Payer: Self-pay

## 2015-06-28 MED ORDER — LORAZEPAM 0.5 MG PO TABS: ORAL_TABLET | ORAL | Status: DC

## 2015-06-28 NOTE — Telephone Encounter (Signed)
RX faxed to [facility] @ [phone], phone number [phone] 

## 2015-07-19 ENCOUNTER — Non-Acute Institutional Stay (SKILLED_NURSING_FACILITY): Payer: Medicare Other | Admitting: Internal Medicine

## 2015-07-19 DIAGNOSIS — Z95810 Presence of automatic (implantable) cardiac defibrillator: Secondary | ICD-10-CM | POA: Diagnosis not present

## 2015-07-19 DIAGNOSIS — I482 Chronic atrial fibrillation, unspecified: Secondary | ICD-10-CM

## 2015-07-19 DIAGNOSIS — F419 Anxiety disorder, unspecified: Secondary | ICD-10-CM | POA: Diagnosis not present

## 2015-07-19 DIAGNOSIS — G309 Alzheimer's disease, unspecified: Secondary | ICD-10-CM | POA: Diagnosis not present

## 2015-07-19 DIAGNOSIS — L299 Pruritus, unspecified: Secondary | ICD-10-CM

## 2015-07-19 DIAGNOSIS — E039 Hypothyroidism, unspecified: Secondary | ICD-10-CM | POA: Diagnosis not present

## 2015-07-19 DIAGNOSIS — I10 Essential (primary) hypertension: Secondary | ICD-10-CM | POA: Diagnosis not present

## 2015-07-19 DIAGNOSIS — F329 Major depressive disorder, single episode, unspecified: Secondary | ICD-10-CM | POA: Diagnosis not present

## 2015-07-19 DIAGNOSIS — F0280 Dementia in other diseases classified elsewhere without behavioral disturbance: Secondary | ICD-10-CM | POA: Diagnosis not present

## 2015-07-19 DIAGNOSIS — F0393 Unspecified dementia, unspecified severity, with mood disturbance: Secondary | ICD-10-CM

## 2015-07-19 NOTE — Progress Notes (Signed)
Patient ID: Kevin [name], male   DOB: June 11, 1925, 81 y.o.   MRN: [id number]    DATE: 07/19/15  Location:  [facility]    Place of Service: [phone])   Extended Emergency Contact Information Primary Emergency Contact: [place] Address: [address]          [place], [place] [address] Darden Amber of [place] Home Phone: [phone] Mobile Phone: [phone] Relation: Daughter Secondary Emergency Contact: Dionisio Paschal Address: [address]          [place], [place] [address] Darden Amber of [place] Home Phone: [phone] Mobile Phone: [phone] Relation: Relative  Advanced Directive information  DNR  Chief Complaint  Patient presents with  . Medical Management of Chronic Issues    HPI:  81 yo male long term resident seen today for f/u. He c/o itching. He takes benadryl prn. No nursing issues. Sleeping well and eating well. He is a poor historian due to dementia. Hx obtained from chart  He has an AICD due to NICM/LBBB. Afib rate controlled. No CHF exac recently. BP controlled. He takes amiodarone, dig, lasix, metoprolol, potassium, norvasc and ASA.  Mood stable on lorazepam. He does not take any med for dementia  Urinary sx's controlled on flomax  Thyroid stable on synthroid  Pain is controlled with tramadol   Past Medical History  Diagnosis Date  . UTI (lower urinary tract infection)   . Nonischemic cardiomyopathy   . Insomnia   . Obesity   . AICD (automatic cardioverter/defibrillator) present   . Hypertension   . Vitamin D deficiency   . Sleep apnea     no cpap  . Dementia   . Depressive disorder   . LBBB (left bundle branch block)     Past Surgical History  Procedure Laterality Date  . Total knee arthroplasty      left  . Back surgery    . Cardiac defibrillator placement      ST Jude  . Esophagogastroduodenoscopy  06/28/2012    Procedure: ESOPHAGOGASTRODUODENOSCOPY (EGD);  Surgeon: Petra Kuba, MD;  Location: [facility]  ENDOSCOPY;  Service: Endoscopy;  Laterality: N/A;  . Hernia repair      Patient Care Team: Kirt Boys, DO as PCP - General (Internal Medicine) Sharee Holster, NP as Nurse Practitioner (Nurse Practitioner)  History   Social History  . Marital Status: Married    Spouse Name: N/A  . Number of Children: N/A  . Years of Education: N/A   Occupational History  . Not on file.   Social History Main Topics  . Smoking status: Never Smoker   . Smokeless tobacco: Never Used  . Alcohol Use: No  . Drug Use: No  . Sexual Activity: Not on file   Other Topics Concern  . Not on file   Social History Narrative     reports that he has never smoked. He has never used smokeless tobacco. He reports that he does not drink alcohol or use illicit drugs.  Immunization History  Administered Date(s) Administered  . Influenza Split 10/22/2011, 09/26/2012, 10/14/2013  . Influenza-Unspecified 10/06/2014  . PPD Test 10/20/2010  . Pneumococcal Conjugate-13 08/24/2009  . Pneumococcal-Unspecified 10/12/2014    Allergies  Allergen Reactions  . Morphine Other (See Comments)    Per MAR  . Wellbutrin [Bupropion] Other (See Comments)    Per MAR     Medications: Patient's Medications  New Prescriptions   No medications on file  Previous Medications   AMIODARONE (PACERONE) 200  MG TABLET    Take 1 tablet (200 mg total) by mouth daily. Start amiodarone  daily on 12/24/14   AMLODIPINE (NORVASC) 2.5 MG TABLET    Take 2.5 mg by mouth daily.   ASPIRIN EC 81 MG TABLET    Take 1 tablet (81 mg total) by mouth daily.   CALCIUM CARB-CHOLECALCIFEROL (CALCIUM-VITAMIN D) 500-400 MG-UNIT TABS    Take 1 tablet by mouth daily.   DIGOXIN (LANOXIN) 0.125 MG TABLET    Take 1 tablet (0.125 mg total) by mouth daily.   DIPHENHYDRAMINE (BENADRYL) 25 MG TABLET    Take 1 tablet (25 mg total) by mouth 2 (two) times daily as needed for itching.   FUROSEMIDE (LASIX) 40 MG TABLET    Take 1 tablet (40 mg total) by mouth  daily.   GUAIFENESIN (ROBITUSSIN) 100 MG/5ML SOLN    Take 10 mLs by mouth every 6 (six) hours as needed for cough (cough).    IRON POLYSACCHARIDES (NIFEREX) 150 MG CAPSULE    Take 150 mg by mouth daily.   LEVOTHYROXINE (LEVOTHROID) 25 MCG TABLET    Take 1 tablet (25 mcg total) by mouth daily before breakfast.   LORAZEPAM (ATIVAN) 0.5 MG TABLET    1 by mouth twice daily and 1 by mouth every 8 hours as needed   MAGNESIUM HYDROXIDE (MILK OF MAGNESIA) 400 MG/5ML SUSPENSION    Take 30 mLs by mouth daily as needed for mild constipation (constipation).    METOPROLOL TARTRATE (LOPRESSOR) 25 MG TABLET    Take 1 tablet (25 mg total) by mouth 2 (two) times daily.   POLYETHYLENE GLYCOL (MIRALAX / GLYCOLAX) PACKET    Take 17 g by mouth daily.   POTASSIUM CHLORIDE SA (K-DUR,KLOR-CON) 20 MEQ TABLET    Take 2 tablets (40 mEq total) by mouth daily.   SENNA (SENOKOT) 8.6 MG TABLET    Take 2 tablets by mouth daily.   TAMSULOSIN HCL (FLOMAX) 0.4 MG CAPS    Take 1 capsule (0.4 mg total) by mouth daily after supper.   TRAMADOL (ULTRAM) 50 MG TABLET    Take 1 tablet (50 mg total) by mouth every 6 (six) hours as needed. For pain  Modified Medications   No medications on file  Discontinued Medications   No medications on file    Review of Systems  Unable to perform ROS: Dementia    Filed Vitals:   07/19/15 1730  BP: 122/67  Pulse: 83  Temp: 97.9 F (36.6 C)  Weight: 137 lb (62.143 kg)  SpO2: 96%   Body mass index is 22.12 kg/(m^2).  Physical Exam  Constitutional: He appears well-developed.  Lying in bed in NAD, frail appearing  HENT:  Mouth/Throat: Oropharynx is clear and moist.  Eyes: Pupils are equal, round, and reactive to light. No scleral icterus.  Neck: Neck supple. Carotid bruit is not present. No thyromegaly present.  Cardiovascular: Normal rate, regular rhythm and intact distal pulses.  Exam reveals no gallop and no friction rub.   Murmur (1/6 SEM) heard. no distal LE swelling. No calf TTP    Pulmonary/Chest: Effort normal and breath sounds normal. He has no wheezes. He has no rales. He exhibits no tenderness.  Abdominal: Soft. Bowel sounds are normal. He exhibits no distension, no abdominal bruit, no pulsatile midline mass and no mass. There is no tenderness. There is no rebound and no guarding.  Lymphadenopathy:    He has no cervical adenopathy.  Neurological: He is alert.  Skin: Skin is warm  and dry. No rash noted.  (+) excoriations  Psychiatric: He has a normal mood and affect. His behavior is normal.     Labs reviewed:  LABS REVIEWED: WILL DECLINE LABS    11-22-14: glucose 90; bun 24; creat 1.38; k+4.6; na++138 11-26-14: wbc 3.6; hgb 11.8; hct 38; mcv 79.3; plt 320; glucose 129; bun 25; creat 1.24; k+4.7; na++141 liver normal albumin 3.2; tsh 7.394; digoxin 1.6  12-08-14: inr 5.15  03-28-15: urine culture: e-coli: rocephin 04-06-15: wbc 4.1; hgb 10.4; hct 33.4; mcv 81.3; plt 310; glucose 80; bun 22; creat 1.10; k+4.2; na++139; liver normal albumin 3.0; tsh 4.405; dig 0.7  No results found.   Assessment/Plan   ICD-9-CM ICD-10-CM   1. Alzheimer's dementia 331.0 G30.9    294.10 F02.80   2. Anxiety 300.00 F41.9   3. Essential hypertension, benign 401.1 I10   4. Chronic atrial fibrillation (HCC) 427.31 I48.2   5. Hypothyroidism, unspecified hypothyroidism type 244.9 E03.9   6. Biventricular ICD (implantable cardioverter-defibrillator) in place V45.02 Z95.810   7. 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 Continue current medications as ordered. PT/OT/ST as indicated. Will follow  Sindhu Nguyen S. Ancil Linsey  [facility] and Adult [facility] [address] [place], [place] [address] [phone] Cell (Monday-Friday 8 AM - 5 PM) [phone] After 5 PM and follow prompts

## 2015-08-16 ENCOUNTER — Encounter: Payer: Self-pay | Admitting: Adult Health

## 2015-08-16 NOTE — Progress Notes (Signed)
Patient ID: Kevin [name], male   DOB: 09/25/1925, 81 y.o.   MRN: [id number]    Facility: [facility]      Allergies  Allergen Reactions  . Morphine Other (See Comments)    Per MAR  . Wellbutrin [Bupropion] Other (See Comments)    Per [facility]     Chief Complaint  Patient presents with  . Medical Management of Chronic Issues    HPI:  He is a long term resident of this facility being seen for the management of his chronic illnesses. Overall there is little change in his status. His current weight is 140 pounds. He is unable to participate in the hpi or ros; but states he is feeling "alright". There are no nursing concerns at this time.     Past Medical History  Diagnosis Date  . UTI (lower urinary tract infection)   . Nonischemic cardiomyopathy   . Insomnia   . Obesity   . AICD (automatic cardioverter/defibrillator) present   . Hypertension   . Vitamin D deficiency   . Sleep apnea     no cpap  . Dementia   . Depressive disorder   . LBBB (left bundle branch block)     Past Surgical History  Procedure Laterality Date  . Total knee arthroplasty      left  . Back surgery    . Cardiac defibrillator placement      ST Jude  . Esophagogastroduodenoscopy  06/28/2012    Procedure: ESOPHAGOGASTRODUODENOSCOPY (EGD);  Surgeon: Petra Kuba, MD;  Location: [facility] ENDOSCOPY;  Service: Endoscopy;  Laterality: N/A;  . Hernia repair      VITAL SIGNS BP 118/64 mmHg  Pulse 60  Ht  (1.676 m)  Wt 140 lb (63.504 kg)  BMI 22.61 kg/m2  Patient's Medications  New Prescriptions   No medications on file  Previous Medications   AMIODARONE (PACERONE) 200 MG TABLET    Take 1 tablet (200 mg total) by mouth daily. Start amiodarone  daily on 12/24/14   ASPIRIN EC 81 MG TABLET    Take 1 tablet (81 mg total) by mouth daily.   CALCIUM CARB-CHOLECALCIFEROL (CALCIUM-VITAMIN D) 500-400 MG-UNIT TABS    Take 1 tablet by mouth daily.   DIGOXIN (LANOXIN) 0.125 MG TABLET    Take 1  tablet (0.125 mg total) by mouth daily.   DIPHENHYDRAMINE (BENADRYL) 25 MG TABLET    Take 1 tablet (25 mg total) by mouth 2 (two) times daily as needed for itching.   FUROSEMIDE (LASIX) 40 MG TABLET    Take 1 tablet (40 mg total) by mouth daily.   IRON POLYSACCHARIDES (NIFEREX) 150 MG CAPSULE    Take 150 mg by mouth daily.   LEVOTHYROXINE (LEVOTHROID) 25 MCG TABLET    Take 1 tablet (25 mcg total) by mouth daily before breakfast.   LORAZEPAM (ATIVAN) 0.5 MG TABLET    1 by mouth twice daily and 1 by mouth every 8 hours as needed   METOPROLOL TARTRATE (LOPRESSOR) 25 MG TABLET    Take 1 tablet (25 mg total) by mouth 2 (two) times daily.   POLYETHYLENE GLYCOL (MIRALAX / GLYCOLAX) PACKET    Take 17 g by mouth daily.   POTASSIUM CHLORIDE SA (K-DUR,KLOR-CON) 20 MEQ TABLET    Take 2 tablets (40 mEq total) by mouth daily.   SENNA (SENOKOT) 8.6 MG TABLET    Take 2 tablets by mouth daily.   TAMSULOSIN HCL (FLOMAX) 0.4 MG CAPS    Take  1 capsule (0.4 mg total) by mouth daily after supper.   TRAMADOL (ULTRAM) 50 MG TABLET    Take 1 tablet (50 mg total) by mouth every 6 (six) hours as needed. For pain  Modified Medications   No medications on file  Discontinued Medications   AMLODIPINE (NORVASC) 2.5 MG TABLET    Take 2.5 mg by mouth daily.   GUAIFENESIN (ROBITUSSIN) 100 MG/5ML SOLN    Take 10 mLs by mouth every 6 (six) hours as needed for cough (cough).    MAGNESIUM HYDROXIDE (MILK OF MAGNESIA) 400 MG/5ML SUSPENSION    Take 30 mLs by mouth daily as needed for mild constipation (constipation).      SIGNIFICANT DIAGNOSTIC EXAMS   LABS REVIEWED: WILL DECLINE LABS    11-22-14: glucose 90; bun 24; creat 1.38; k+4.6; na++138 11-26-14: wbc 3.6; hgb 11.8; hct 38; mcv 79.3; plt 320; glucose 129; bun 25; creat 1.24; k+4.7; na++141 liver normal albumin 3.2; tsh 7.394; digoxin 1.6  12-08-14: inr 5.15  03-28-15: urine culture: e-coli: rocephin 04-06-15: wbc 4.1; hgb 10.4; hct 33.4; mcv 81.3; plt 310; glucose 80; bun  22; creat 1.10; k+4.2; na++139; liver normal albumin 3.0; tsh 4.405; dig 0.7         Review of Systems  Unable to perform ROS: Dementia      Physical Exam  Constitutional: No distress.  Eyes: Conjunctivae are normal.  Neck: Neck supple. No JVD present. No thyromegaly present.  Cardiovascular: Normal rate and intact distal pulses.   Heart rate slightly irregular   Respiratory: Effort normal and breath sounds normal. No respiratory distress. He has no wheezes.  GI: Soft. Bowel sounds are normal. He exhibits no distension. There is no tenderness.  Musculoskeletal: He exhibits no edema.  Able to move all extremities   Lymphadenopathy:    He has no cervical adenopathy.  Neurological: He is alert.  Skin: Skin is warm and dry. He is not diaphoretic.  Psychiatric: He has a normal mood and affect.       ASSESSMENT/ PLAN:  1. Afib: his heart rate is stable; will continue amiodarone 200 mg daily; digoxin 0.125 mg daily lopressor 25 mg twice daily for rate control asa 81 mg daily will monitor dig level is 0.7   2. Diastolic heart failure: is stable will continue  lasix  20 mg daily with k+ 40 meq daily will monitor his status. He is status post icd placement   3. Hypothyroidism: will continue synthroid 25 mcg daily  tsh is 4.405   4. Alzheimer's disease: his disease state is advanced he is not taking medications at this time; will not make changes will monitor his status.   5. Bph: will continue flomax 0.4 mg daily   6. Anemia: will continue nu-iron daily hgb is 10.4   7. Constipation: will continue miralax daily; senna 2 tabs daily   8. Anxiety: will continue  ativan  0.5 mg twice daily and every 8 hours as needed his abilify was stopped; will not make changes will monitor  9. Hypertension: will continue lopressor 25 mg twice daily will monitor      [name] NP [facility]  Contact [phone] Monday through Friday 8am- 5pm  After hours call  [phone]

## 2015-08-29 ENCOUNTER — Non-Acute Institutional Stay (SKILLED_NURSING_FACILITY): Payer: Medicare Other | Admitting: Adult Health

## 2015-08-29 DIAGNOSIS — N400 Enlarged prostate without lower urinary tract symptoms: Secondary | ICD-10-CM | POA: Diagnosis not present

## 2015-08-29 DIAGNOSIS — K5909 Other constipation: Secondary | ICD-10-CM

## 2015-08-29 DIAGNOSIS — I5041 Acute combined systolic (congestive) and diastolic (congestive) heart failure: Secondary | ICD-10-CM | POA: Diagnosis not present

## 2015-08-29 DIAGNOSIS — G309 Alzheimer's disease, unspecified: Secondary | ICD-10-CM

## 2015-08-29 DIAGNOSIS — I482 Chronic atrial fibrillation, unspecified: Secondary | ICD-10-CM

## 2015-08-29 DIAGNOSIS — R338 Other retention of urine: Secondary | ICD-10-CM

## 2015-08-29 DIAGNOSIS — F0280 Dementia in other diseases classified elsewhere without behavioral disturbance: Secondary | ICD-10-CM

## 2015-08-29 DIAGNOSIS — I10 Essential (primary) hypertension: Secondary | ICD-10-CM | POA: Diagnosis not present

## 2015-08-29 DIAGNOSIS — E039 Hypothyroidism, unspecified: Secondary | ICD-10-CM | POA: Diagnosis not present

## 2015-08-29 DIAGNOSIS — N401 Enlarged prostate with lower urinary tract symptoms: Secondary | ICD-10-CM

## 2015-09-27 ENCOUNTER — Encounter: Payer: Self-pay | Admitting: *Deleted

## 2015-09-30 ENCOUNTER — Non-Acute Institutional Stay (SKILLED_NURSING_FACILITY): Payer: Medicare Other | Admitting: Adult Health

## 2015-09-30 DIAGNOSIS — I10 Essential (primary) hypertension: Secondary | ICD-10-CM

## 2015-09-30 DIAGNOSIS — F419 Anxiety disorder, unspecified: Secondary | ICD-10-CM

## 2015-09-30 DIAGNOSIS — I5041 Acute combined systolic (congestive) and diastolic (congestive) heart failure: Secondary | ICD-10-CM

## 2015-09-30 DIAGNOSIS — I482 Chronic atrial fibrillation, unspecified: Secondary | ICD-10-CM

## 2015-09-30 DIAGNOSIS — N400 Enlarged prostate without lower urinary tract symptoms: Secondary | ICD-10-CM | POA: Diagnosis not present

## 2015-09-30 DIAGNOSIS — F0280 Dementia in other diseases classified elsewhere without behavioral disturbance: Secondary | ICD-10-CM

## 2015-09-30 DIAGNOSIS — E039 Hypothyroidism, unspecified: Secondary | ICD-10-CM

## 2015-09-30 DIAGNOSIS — R338 Other retention of urine: Secondary | ICD-10-CM

## 2015-09-30 DIAGNOSIS — G309 Alzheimer's disease, unspecified: Secondary | ICD-10-CM

## 2015-09-30 DIAGNOSIS — N401 Enlarged prostate with lower urinary tract symptoms: Secondary | ICD-10-CM

## 2015-10-04 ENCOUNTER — Encounter: Payer: Self-pay | Admitting: Internal Medicine

## 2015-10-04 ENCOUNTER — Non-Acute Institutional Stay (SKILLED_NURSING_FACILITY): Payer: Medicare Other | Admitting: Internal Medicine

## 2015-10-04 DIAGNOSIS — F419 Anxiety disorder, unspecified: Secondary | ICD-10-CM | POA: Diagnosis not present

## 2015-10-04 DIAGNOSIS — L209 Atopic dermatitis, unspecified: Secondary | ICD-10-CM | POA: Diagnosis not present

## 2015-10-04 DIAGNOSIS — F0280 Dementia in other diseases classified elsewhere without behavioral disturbance: Secondary | ICD-10-CM | POA: Diagnosis not present

## 2015-10-04 DIAGNOSIS — L299 Pruritus, unspecified: Secondary | ICD-10-CM

## 2015-10-04 DIAGNOSIS — G309 Alzheimer's disease, unspecified: Secondary | ICD-10-CM | POA: Diagnosis not present

## 2015-10-04 NOTE — Progress Notes (Signed)
Patient ID: Kevin [name], male   DOB: 1925-08-12, 81 y.o.   MRN: [id number]    DATE: 10/04/15  Location:  [facility]    Place of Service: [phone])   Extended Emergency Contact Information Primary Emergency Contact: [place] Address: [address]          [place], [place] [address] D[place] of [place] Home Phone: [phone] Mobile Phone: [phone] Relation: Daughter Secondary Emergency Contact: Dionisio Paschal Address: [address]          [place], [place] [address] D[place] of [place] Home Phone: [phone] Mobile Phone: [phone] Relation: Relative  Advanced Directive information  DNR  Chief Complaint  Patient presents with  . Acute Visit    RASH    HPI:  81 yo male long term resident seen today for rash. Pt reports rash present for unknown duration although nursing noticed it today. No blisters or redness. No pain. No f/c. No sick contacts. He has chronic pruritus due to anxiety. He has scheduled and prn ativan ordered. He also takes prn benadryl for itching. No new meds. He is a poor historian due to dementia. Hx obtained form nursing and chart.   Past Medical History  Diagnosis Date  . UTI (lower urinary tract infection)   . Nonischemic cardiomyopathy (HCC)   . Insomnia   . Obesity   . AICD (automatic cardioverter/defibrillator) present   . Hypertension   . Vitamin D deficiency   . Sleep apnea     no cpap  . Dementia   . Depressive disorder   . LBBB (left bundle branch block)     Past Surgical History  Procedure Laterality Date  . Total knee arthroplasty      left  . Back surgery    . Cardiac defibrillator placement      ST Jude  . Esophagogastroduodenoscopy  06/28/2012    Procedure: ESOPHAGOGASTRODUODENOSCOPY (EGD);  Surgeon: Petra Kuba, MD;  Location: [facility] ENDOSCOPY;  Service: Endoscopy;  Laterality: N/A;  . Hernia repair      Patient Care Team: Kirt Boys, DO as PCP - General (Internal Medicine) Sharee Holster, NP as Nurse Practitioner (Nurse Practitioner)  Social History   Social History  . Marital Status: Married    Spouse Name: N/A  . Number of Children: N/A  . Years of Education: N/A   Occupational History  . Not on file.   Social History Main Topics  . Smoking status: Never Smoker   . Smokeless tobacco: Never Used  . Alcohol Use: No  . Drug Use: No  . Sexual Activity: Not on file   Other Topics Concern  . Not on file   Social History Narrative     reports that he has never smoked. He has never used smokeless tobacco. He reports that he does not drink alcohol or use illicit drugs.  Immunization History  Administered Date(s) Administered  . Influenza Split 10/22/2011, 09/26/2012, 10/14/2013  . Influenza-Unspecified 10/06/2014  . PPD Test 10/20/2010  . Pneumococcal Conjugate-13 08/24/2009  . Pneumococcal-Unspecified 10/12/2014    Allergies  Allergen Reactions  . Morphine Other (See Comments)    Per MAR  . Wellbutrin [Bupropion] Other (See Comments)    Per MAR     Medications: Patient's Medications  New Prescriptions   No medications on file  Previous Medications   AMIODARONE (PACERONE) 200 MG TABLET    Take 1 tablet (200 mg total) by mouth daily. Start amiodarone 200mg  daily on 12/24/14  ASPIRIN EC 81 MG TABLET    Take 1 tablet (81 mg total) by mouth daily.   CALCIUM CARB-CHOLECALCIFEROL (CALCIUM-VITAMIN D) 500-400 MG-UNIT TABS    Take 1 tablet by mouth daily.   DIGOXIN (LANOXIN) 0.125 MG TABLET    Take 1 tablet (0.125 mg total) by mouth daily.   DIPHENHYDRAMINE (BENADRYL) 25 MG TABLET    Take 1 tablet (25 mg total) by mouth 2 (two) times daily as needed for itching.   FUROSEMIDE (LASIX) 40 MG TABLET    Take 1 tablet (40 mg total) by mouth daily.   IRON POLYSACCHARIDES (NIFEREX) 150 MG CAPSULE    Take 150 mg by mouth daily.   LEVOTHYROXINE (LEVOTHROID) 25 MCG TABLET    Take 1 tablet (25 mcg total) by mouth daily before breakfast.   LORAZEPAM (ATIVAN) 0.5  MG TABLET    1 by mouth twice daily and 1 by mouth every 8 hours as needed   METOPROLOL TARTRATE (LOPRESSOR) 25 MG TABLET    Take 1 tablet (25 mg total) by mouth 2 (two) times daily.   POLYETHYLENE GLYCOL (MIRALAX / GLYCOLAX) PACKET    Take 17 g by mouth daily.   POTASSIUM CHLORIDE SA (K-DUR,KLOR-CON) 20 MEQ TABLET    Take 2 tablets (40 mEq total) by mouth daily.   SENNA (SENOKOT) 8.6 MG TABLET    Take 2 tablets by mouth daily.   TAMSULOSIN HCL (FLOMAX) 0.4 MG CAPS    Take 1 capsule (0.4 mg total) by mouth daily after supper.   TRAMADOL (ULTRAM) 50 MG TABLET    Take 1 tablet (50 mg total) by mouth every 6 (six) hours as needed. For pain  Modified Medications   No medications on file  Discontinued Medications   No medications on file    Review of Systems  Unable to perform ROS: Dementia    Filed Vitals:   10/04/15 1723  BP: 162/80  Pulse: 78  Temp: 97.5 F (36.4 C)  SpO2: 96%   There is no weight on file to calculate BMI.  Physical Exam  Constitutional: He appears well-developed. No distress.  Frail appearing in NAD  Neurological: He is alert.  Skin: Skin is warm and dry. Rash noted. Rash is papular (papulosquamous flat. no redness. NT). Rash is not macular, not nodular, not pustular, not vesicular and not urticarial. He is not diaphoretic. No erythema.     Psychiatric: His speech is normal and behavior is normal. His mood appears anxious. He is not agitated and not combative.     Labs reviewed: No visits with results within 3 Month(s) from this visit. Latest known visit with results is:  Orders Only on 01/19/2015  Component Date Value Ref Range Status  . TSH 01/19/2015 CANCELED  0.350 - 4.500 uIU/mL Final   Comment: Patient refused.  Result canceled by the ancillary     No results found.   Assessment/Plan   ICD-9-CM ICD-10-CM   1. Atopic dermatitis 691.8 L20.9   2. Pruritus - due to #4>#1 698.9 L29.9   3. Alzheimer's dementia - stable 331.0 G30.9    294.10  F02.80   4. Anxiety - stable overall 300.00 F41.9     --start triamcinolone lotion 0.1% apply to rash BID x 2 weeks or until resolves then use prn  --use moisturizing lotion to skin BID  --cont other meds as ordered  --will follow  Kamisha Ell S. [facility] [address]  [place], [place] [address] [phone] Cell (Monday-Friday 8 AM - 5 PM) [phone] After 5 PM and follow prompts

## 2015-10-25 ENCOUNTER — Encounter: Payer: Self-pay | Admitting: Adult Health

## 2015-10-25 NOTE — Progress Notes (Signed)
Patient ID: Kevin [name], male   DOB: [birth year], 81 y.o.   MRN: [id number]    Facility: [facility]      Allergies  Allergen Reactions  . Morphine Other (See Comments)    Per MAR  . Wellbutrin [Bupropion] Other (See Comments)    Per [facility]     Chief Complaint  Patient presents with  . Medical Management of Chronic Issues    HPI:  He is a long term resident of this facility being seen for the management of his chronic illnesses. Overall there is little change in his status his current weight is 131 pounds June 2016: weight was 140 pounds. He is unable to fully participate in the hpi or ros. There are no nursing concerns at this time.    Past Medical History  Diagnosis Date  . UTI (lower urinary tract infection)   . Nonischemic cardiomyopathy (HCC)   . Insomnia   . Obesity   . AICD (automatic cardioverter/defibrillator) present   . Hypertension   . Vitamin D deficiency   . Sleep apnea     no cpap  . Dementia   . Depressive disorder   . LBBB (left bundle branch block)     Past Surgical History  Procedure Laterality Date  . Total knee arthroplasty      left  . Back surgery    . Cardiac defibrillator placement      ST Jude  . Esophagogastroduodenoscopy  06/28/2012    Procedure: ESOPHAGOGASTRODUODENOSCOPY (EGD);  Surgeon: Petra Kuba, MD;  Location: [facility] ENDOSCOPY;  Service: Endoscopy;  Laterality: N/A;  . Hernia repair      VITAL SIGNS BP 102/56 mmHg  Pulse 70  Ht 5' (1.524 m)  Wt 131 lb (59.421 kg)  BMI 25.58 kg/m2  Patient's Medications  New Prescriptions   No medications on file  Previous Medications   AMIODARONE (PACERONE) 200 MG TABLET    Take 1 tablet (200 mg total) by mouth daily. Start amiodarone  daily on 12/24/14   AMLODIPINE (NORVASC) 2.5 MG TABLET    Take 2.5 mg by mouth daily.   ASPIRIN EC 81 MG TABLET    Take 1 tablet (81 mg total) by mouth daily.   CALCIUM CARB-CHOLECALCIFEROL (CALCIUM-VITAMIN D) 500-400 MG-UNIT TABS    Take 1  tablet by mouth daily.   DIGOXIN (LANOXIN) 0.125 MG TABLET    Take 1 tablet (0.125 mg total) by mouth daily.   DIPHENHYDRAMINE (BENADRYL) 25 MG TABLET    Take 1 tablet (25 mg total) by mouth 2 (two) times daily as needed for itching.   FUROSEMIDE (LASIX) 40 MG TABLET    Take 20 mg  by mouth daily.   IRON POLYSACCHARIDES (NIFEREX) 150 MG CAPSULE    Take 150 mg by mouth daily.   LEVOTHYROXINE (LEVOTHROID) 25 MCG TABLET    Take 1 tablet (25 mcg total) by mouth daily before breakfast.   LORAZEPAM (ATIVAN) 0.5 MG TABLET    1 by mouth twice daily and 1 by mouth every 8 hours as needed   METOPROLOL TARTRATE (LOPRESSOR) 25 MG TABLET    Take 1 tablet (25 mg total) by mouth 2 (two) times daily.   POLYETHYLENE GLYCOL (MIRALAX / GLYCOLAX) PACKET    Take 17 g by mouth daily.   POTASSIUM CHLORIDE SA (K-DUR,KLOR-CON) 20 MEQ TABLET    Take 2 tablets (40 mEq total) by mouth daily.   SENNA (SENOKOT) 8.6 MG TABLET    Take 2 tablets by  mouth daily.   TAMSULOSIN HCL (FLOMAX) 0.4 MG CAPS    Take 1 capsule (0.4 mg total) by mouth daily after supper.   TRAMADOL (ULTRAM) 50 MG TABLET    Take 1 tablet (50 mg total) by mouth every 6 (six) hours as needed. For pain  Modified Medications   No medications on file  Discontinued Medications   No medications on file     SIGNIFICANT DIAGNOSTIC EXAMS   LABS REVIEWED: WILL DECLINE LABS    11-22-14: glucose 90; bun 24; creat 1.38; k+4.6; na++138 11-26-14: wbc 3.6; hgb 11.8; hct 38; mcv 79.3; plt 320; glucose 129; bun 25; creat 1.24; k+4.7; na++141 liver normal albumin 3.2; tsh 7.394; digoxin 1.6  12-08-14: inr 5.15  03-28-15: urine culture: e-coli: rocephin 04-06-15: wbc 4.1; hgb 10.4; hct 33.4; mcv 81.3; plt 310; glucose 80; bun 22; creat 1.10; k+4.2; na++139; liver normal albumin 3.0; tsh 4.405; dig 0.7       Review of Systems Unable to perform ROS: Dementia     Physical Exam Constitutional: No distress.  Eyes: Conjunctivae are normal.  Neck: Neck supple. No JVD  present. No thyromegaly present.  Cardiovascular: Normal rate and intact distal pulses.   Heart rate slightly irregular   Respiratory: Effort normal and breath sounds normal. No respiratory distress. He has no wheezes.  GI: Soft. Bowel sounds are normal. He exhibits no distension. There is no tenderness.  Musculoskeletal: He exhibits no edema.  Able to move all extremities   Lymphadenopathy:    He has no cervical adenopathy.  Neurological: He is alert.  Skin: Skin is warm and dry. He is not diaphoretic.  Psychiatric: He has a normal mood and affect.     ASSESSMENT/ PLAN:  1. Afib: his heart rate is stable; will continue amiodarone 200 mg daily; digoxin 0.125 mg daily lopressor 25 mg twice daily for rate control asa 81 mg daily will monitor dig level is 0.7   2. Diastolic heart failure: is stable will continue  lasix  20 mg daily with k+ 40 meq daily will monitor his status. He is status post icd placement   3. Hypothyroidism: will continue synthroid 25 mcg daily  tsh is 4.405   4. Alzheimer's disease: his disease state is advanced he is not taking medications at this time; will not make changes will monitor his status.   5. Bph: will continue flomax 0.4 mg daily   6. Anemia: will continue nu-iron daily hgb is 10.4   7. Constipation: will continue miralax daily; senna 2 tabs daily   8. Anxiety: will continue  ativan  0.5 mg twice daily and every 8 hours as needed his abilify was stopped; will not make changes will monitor  9. Hypertension: will continue lopressor 25 mg twice daily will monitor      [name] NP [facility]  Contact [phone] Monday through Friday 8am- 5pm  After hours call [phone]

## 2015-11-02 ENCOUNTER — Non-Acute Institutional Stay (SKILLED_NURSING_FACILITY): Payer: Medicare Other | Admitting: Adult Health

## 2015-11-02 ENCOUNTER — Encounter: Payer: Self-pay | Admitting: Adult Health

## 2015-11-02 DIAGNOSIS — G309 Alzheimer's disease, unspecified: Secondary | ICD-10-CM | POA: Diagnosis not present

## 2015-11-02 DIAGNOSIS — I482 Chronic atrial fibrillation, unspecified: Secondary | ICD-10-CM

## 2015-11-02 DIAGNOSIS — F419 Anxiety disorder, unspecified: Secondary | ICD-10-CM | POA: Diagnosis not present

## 2015-11-02 DIAGNOSIS — I10 Essential (primary) hypertension: Secondary | ICD-10-CM | POA: Diagnosis not present

## 2015-11-02 DIAGNOSIS — I5041 Acute combined systolic (congestive) and diastolic (congestive) heart failure: Secondary | ICD-10-CM | POA: Diagnosis not present

## 2015-11-02 DIAGNOSIS — F0280 Dementia in other diseases classified elsewhere without behavioral disturbance: Secondary | ICD-10-CM | POA: Diagnosis not present

## 2015-11-02 DIAGNOSIS — N401 Enlarged prostate with lower urinary tract symptoms: Secondary | ICD-10-CM

## 2015-11-02 DIAGNOSIS — N400 Enlarged prostate without lower urinary tract symptoms: Secondary | ICD-10-CM

## 2015-11-02 DIAGNOSIS — E039 Hypothyroidism, unspecified: Secondary | ICD-10-CM | POA: Diagnosis not present

## 2015-11-02 DIAGNOSIS — R338 Other retention of urine: Secondary | ICD-10-CM

## 2015-11-02 NOTE — Progress Notes (Signed)
Patient ID: Kevin [name], male   DOB: Dec 22, 1925, 81 y.o.   MRN: [id number]    Facility: [facility]      Allergies  Allergen Reactions  . Morphine Other (See Comments)    Per MAR  . Wellbutrin [Bupropion] Other (See Comments)    Per [facility]     Chief Complaint  Patient presents with  . Medical Management of Chronic Issues    HPI:  He is a long term resident of this facility being seen for the management of his chronic illnesses. Overall there is little change in his status. He is unable to fully participate in the hpi or ros; but he did tell me that he is feeling good. There are no nursing concerns at this time.    Past Medical History  Diagnosis Date  . UTI (lower urinary tract infection)   . Nonischemic cardiomyopathy (HCC)   . Insomnia   . Obesity   . AICD (automatic cardioverter/defibrillator) present   . Hypertension   . Vitamin D deficiency   . Sleep apnea     no cpap  . Dementia   . Depressive disorder   . LBBB (left bundle branch block)     Past Surgical History  Procedure Laterality Date  . Total knee arthroplasty      left  . Back surgery    . Cardiac defibrillator placement      ST Jude  . Esophagogastroduodenoscopy  06/28/2012    Procedure: ESOPHAGOGASTRODUODENOSCOPY (EGD);  Surgeon: Petra Kuba, MD;  Location: [facility] ENDOSCOPY;  Service: Endoscopy;  Laterality: N/A;  . Hernia repair      VITAL SIGNS BP 148/78 mmHg  Pulse 68  Ht 5' (1.524 m)  Wt 138 lb (62.596 kg)  BMI 26.95 kg/m2  Patient's Medications  New Prescriptions   No medications on file  Previous Medications   AMIODARONE (PACERONE) 200 MG TABLET    Take 1 tablet (200 mg total) by mouth daily. Start amiodarone  daily on 12/24/14   AMLODIPINE (NORVASC) 2.5 MG TABLET    Take 2.5 mg by mouth daily.   ASPIRIN EC 81 MG TABLET    Take 1 tablet (81 mg total) by mouth daily.   CALCIUM CARB-CHOLECALCIFEROL (CALCIUM-VITAMIN D) 500-400 MG-UNIT TABS    Take 1 tablet by mouth daily.     DIGOXIN (LANOXIN) 0.125 MG TABLET    Take 1 tablet (0.125 mg total) by mouth daily.   DIPHENHYDRAMINE (BENADRYL) 25 MG TABLET    Take 1 tablet (25 mg total) by mouth 2 (two) times daily as needed for itching.   FUROSEMIDE (LASIX) 40 MG TABLET    Take 1 tablet (40 mg total) by mouth daily.   IRON POLYSACCHARIDES (NIFEREX) 150 MG CAPSULE    Take 150 mg by mouth daily.   LEVOTHYROXINE (LEVOTHROID) 25 MCG TABLET    Take 1 tablet (25 mcg total) by mouth daily before breakfast.   LORAZEPAM (ATIVAN) 0.5 MG TABLET    1 by mouth twice daily and 1 by mouth every 8 hours as needed   METOPROLOL TARTRATE (LOPRESSOR) 25 MG TABLET    Take 1 tablet (25 mg total) by mouth 2 (two) times daily.   POLYETHYLENE GLYCOL (MIRALAX / GLYCOLAX) PACKET    Take 17 g by mouth daily.   POTASSIUM CHLORIDE SA (K-DUR,KLOR-CON) 20 MEQ TABLET    Take 2 tablets (40 mEq total) by mouth daily.   SENNA (SENOKOT) 8.6 MG TABLET    Take 2 tablets  by mouth daily.   TAMSULOSIN HCL (FLOMAX) 0.4 MG CAPS    Take 1 capsule (0.4 mg total) by mouth daily after supper.   TRAMADOL (ULTRAM) 50 MG TABLET    Take 1 tablet (50 mg total) by mouth every 6 (six) hours as needed. For pain  Modified Medications   No medications on file  Discontinued Medications   No medications on file     SIGNIFICANT DIAGNOSTIC EXAMS    LABS REVIEWED: WILL DECLINE LABS    11-22-14: glucose 90; bun 24; creat 1.38; k+4.6; na++138 11-26-14: wbc 3.6; hgb 11.8; hct 38; mcv 79.3; plt 320; glucose 129; bun 25; creat 1.24; k+4.7; na++141 liver normal albumin 3.2; tsh 7.394; digoxin 1.6  12-08-14: inr 5.15  03-28-15: urine culture: e-coli: rocephin 04-06-15: wbc 4.1; hgb 10.4; hct 33.4; mcv 81.3; plt 310; glucose 80; bun 22; creat 1.10; k+4.2; na++139; liver normal albumin 3.0; tsh 4.405; dig 0.7       Review of Systems Unable to perform ROS: Dementia     Physical Exam Constitutional: No distress.  Eyes: Conjunctivae are normal.  Neck: Neck supple. No JVD  present. No thyromegaly present.  Cardiovascular: Normal rate and intact distal pulses.   Heart rate slightly irregular   Respiratory: Effort normal and breath sounds normal. No respiratory distress. He has no wheezes.  GI: Soft. Bowel sounds are normal. He exhibits no distension. There is no tenderness.  Musculoskeletal: He exhibits no edema.  Able to move all extremities   Lymphadenopathy:    He has no cervical adenopathy.  Neurological: He is alert.  Skin: Skin is warm and dry. He is not diaphoretic.  Psychiatric: He has a normal mood and affect.       ASSESSMENT/ PLAN:  1. Afib: his heart rate is stable; will continue amiodarone 200 mg daily; digoxin 0.125 mg daily lopressor 25 mg twice daily for rate control asa 81 mg daily will monitor dig level is 0.7   2. Diastolic heart failure: is stable will continue  lasix  20 mg daily with k+ 40 meq daily will monitor his status. He is status post icd placement   3. Hypothyroidism: will continue synthroid 25 mcg daily  tsh is 4.405   4. Alzheimer's disease: his disease state is advanced he is not taking medications at this time; will not make changes will monitor his status.   5. Bph: will continue flomax 0.4 mg daily   6. Anemia: will continue nu-iron daily hgb is 10.4   7. Constipation: will continue miralax daily; senna 2 tabs daily   8. Anxiety: will continue  ativan  0.5 mg twice daily and every 8 hours as needed his abilify was stopped; will not make changes will monitor  9. Hypertension: will continue lopressor 25 mg twice daily will monitor    Will check cbc; cmp tsh; lipids and dig level    [name] NP [facility]  Contact [phone] Monday through Friday 8am- 5pm  After hours call [phone]

## 2015-11-02 NOTE — Progress Notes (Signed)
Patient ID: Kevin [name], male   DOB: Dec 18, 1925, 81 y.o.   MRN: [id number]    Facility: [facility]      Allergies  Allergen Reactions  . Morphine Other (See Comments)    Per MAR  . Wellbutrin [Bupropion] Other (See Comments)    Per [facility]     Chief Complaint  Patient presents with  . Medical Management of Chronic Issues    HPI:  He is a long term resident of this facility being seen for the management of his chronic illnesses. His status is not significantly changed. He is unable to fully participate in the hpi or ros. There are no nursing concerns at this time. He has been declining his blood draws.    Past Medical History  Diagnosis Date  . UTI (lower urinary tract infection)   . Nonischemic cardiomyopathy (HCC)   . Insomnia   . Obesity   . AICD (automatic cardioverter/defibrillator) present   . Hypertension   . Vitamin D deficiency   . Sleep apnea     no cpap  . Dementia   . Depressive disorder   . LBBB (left bundle branch block)     Past Surgical History  Procedure Laterality Date  . Total knee arthroplasty      left  . Back surgery    . Cardiac defibrillator placement      ST Jude  . Esophagogastroduodenoscopy  06/28/2012    Procedure: ESOPHAGOGASTRODUODENOSCOPY (EGD);  Surgeon: Petra Kuba, MD;  Location: [facility] ENDOSCOPY;  Service: Endoscopy;  Laterality: N/A;  . Hernia repair      VITAL SIGNS BP 120/74 mmHg  Pulse 68  Ht  (1.676 m)  Wt 138 lb (62.596 kg)  BMI 22.28 kg/m2  Patient's Medications  New Prescriptions   No medications on file  Previous Medications   AMIODARONE (PACERONE) 200 MG TABLET    Take 1 tablet (200 mg total) by mouth daily. Start amiodarone  daily on 12/24/14   AMLODIPINE (NORVASC) 2.5 MG TABLET    Take 2.5 mg by mouth daily.   ASPIRIN EC 81 MG TABLET    Take 1 tablet (81 mg total) by mouth daily.   CALCIUM CARB-CHOLECALCIFEROL (CALCIUM-VITAMIN D) 500-400 MG-UNIT TABS    Take 1 tablet by mouth daily.   DIGOXIN (LANOXIN) 0.125 MG TABLET    Take 1 tablet (0.125 mg total) by mouth daily.   DIPHENHYDRAMINE (BENADRYL) 25 MG TABLET    Take 1 tablet (25 mg total) by mouth 2 (two) times daily as needed for itching.   FUROSEMIDE (LASIX) 40 MG TABLET    Take 1 tablet (40 mg total) by mouth daily.   IRON POLYSACCHARIDES (NIFEREX) 150 MG CAPSULE    Take 150 mg by mouth daily.   LEVOTHYROXINE (LEVOTHROID) 25 MCG TABLET    Take 1 tablet (25 mcg total) by mouth daily before breakfast.   LORAZEPAM (ATIVAN) 0.5 MG TABLET    1 by mouth twice daily and 1 by mouth every 8 hours as needed   METOPROLOL TARTRATE (LOPRESSOR) 25 MG TABLET    Take 1 tablet (25 mg total) by mouth 2 (two) times daily.   POLYETHYLENE GLYCOL (MIRALAX / GLYCOLAX) PACKET    Take 17 g by mouth daily.   POTASSIUM CHLORIDE SA (K-DUR,KLOR-CON) 20 MEQ TABLET    Take 2 tablets (40 mEq total) by mouth daily.   SENNA (SENOKOT) 8.6 MG TABLET    Take 2 tablets by mouth daily.   TAMSULOSIN  HCL (FLOMAX) 0.4 MG CAPS    Take 1 capsule (0.4 mg total) by mouth daily after supper.   TRAMADOL (ULTRAM) 50 MG TABLET    Take 1 tablet (50 mg total) by mouth every 6 (six) hours as needed. For pain  Modified Medications   No medications on file  Discontinued Medications   No medications on file     SIGNIFICANT DIAGNOSTIC EXAMS    LABS REVIEWED: WILL DECLINE LABS    11-22-14: glucose 90; bun 24; creat 1.38; k+4.6; na++138 11-26-14: wbc 3.6; hgb 11.8; hct 38; mcv 79.3; plt 320; glucose 129; bun 25; creat 1.24; k+4.7; na++141 liver normal albumin 3.2; tsh 7.394; digoxin 1.6  12-08-14: inr 5.15  03-28-15: urine culture: e-coli: rocephin 04-06-15: wbc 4.1; hgb 10.4; hct 33.4; mcv 81.3; plt 310; glucose 80; bun 22; creat 1.10; k+4.2; na++139; liver normal albumin 3.0; tsh 4.405; dig 0.7      Review of Systems Unable to perform ROS: Dementia      Physical Exam Constitutional: No distress.  Eyes: Conjunctivae are normal.  Neck: Neck supple. No JVD present.  No thyromegaly present.  Cardiovascular: Normal rate and intact distal pulses.   Heart rate slightly irregular   Respiratory: Effort normal and breath sounds normal. No respiratory distress. He has no wheezes.  GI: Soft. Bowel sounds are normal. He exhibits no distension. There is no tenderness.  Musculoskeletal: He exhibits no edema.  Able to move all extremities   Lymphadenopathy:    He has no cervical adenopathy.  Neurological: He is alert.  Skin: Skin is warm and dry. He is not diaphoretic.  Psychiatric: He has a normal mood and affect.      ASSESSMENT/ PLAN:  1. Afib: his heart rate is stable; will continue amiodarone 200 mg daily; digoxin 0.125 mg daily lopressor 25 mg twice daily for rate control asa 81 mg daily will monitor dig level is 0.7   2. Diastolic heart failure: is stable will continue  lasix  20 mg daily with k+ 40 meq daily will monitor his status. He is status post icd placement   3. Hypothyroidism: will continue synthroid 25 mcg daily  tsh is 4.405   4. Alzheimer's disease: his disease state is advanced he is not taking medications at this time; will not make changes will monitor his status.   5. Bph: will continue flomax 0.4 mg daily   6. Anemia: will continue nu-iron daily hgb is 10.4   7. Constipation: will continue miralax daily; senna 2 tabs daily   8. Anxiety: will continue  ativan  0.5 mg twice daily and every 8 hours as needed his abilify was stopped; will not make changes will monitor  9. Hypertension: will continue lopressor 25 mg twice daily will monitor    Will check cbc; cmp tsh; lipids and dig level     [name] NP [facility] Adult [facility]  Contact [phone] Monday through Friday 8am- 5pm  After hours call [phone]

## 2015-11-15 ENCOUNTER — Non-Acute Institutional Stay (SKILLED_NURSING_FACILITY): Payer: Medicare Other | Admitting: Internal Medicine

## 2015-11-15 DIAGNOSIS — F0280 Dementia in other diseases classified elsewhere without behavioral disturbance: Secondary | ICD-10-CM | POA: Diagnosis not present

## 2015-11-15 DIAGNOSIS — G309 Alzheimer's disease, unspecified: Secondary | ICD-10-CM

## 2015-11-15 DIAGNOSIS — F4489 Other dissociative and conversion disorders: Secondary | ICD-10-CM

## 2015-11-15 NOTE — Progress Notes (Signed)
Patient ID: Kevin [name], male   DOB: 1925/04/04, 81 y.o.   MRN: [id number]    DATE: 11/15/15  Location:  [facility]    Place of Service: SNF [phone])   Extended Emergency Contact Information Primary Emergency Contact: [place] Address: [address]          [place], [place] [address] Darden Amber of [place] Home Phone: [phone] Mobile Phone: [phone] Relation: Daughter Secondary Emergency Contact: Dionisio Paschal Address: [address]          [place], [place] [address] Darden Amber of [place] Home Phone: [phone] Mobile Phone: [phone] Relation: Relative  Advanced Directive information  DNR  Chief Complaint  Patient presents with  . Acute Visit    HPI:  81 yo male long term resident seen today for increased confusion per nursing. He has a hx dementia. No recall of fall. No hallucinations. He is a poor historian due to dementia. Hx obtained from chart  . Afib: his heart rate is stable; will continue amiodarone 200 mg daily; digoxin 0.125 mg daily lopressor 25 mg twice daily for rate control asa 81 mg daily will monitor dig level is 0.7   Diastolic heart failure - s/p AICD. stable on  lasix  20 mg daily with k+ 40 meq daily  Hypothyroidism - stable on synthroid 25 mcg daily. TSH 4.405   Alzheimer's disease - advanced. not taking medications at this time   BPH - stable on flomax 0.4 mg daily   Anemia - stable on nu-iron daily.  hgb 10.4   Constipation - stable on miralax daily; senna 2 tabs daily   Anxiety - stable on ativan  0.5 mg twice daily and every 8 hours as needed. No longer on abilify.   Hypertension - BP controlled on lopressor 25 mg twice daily   Past Medical History  Diagnosis Date  . UTI (lower urinary tract infection)   . Nonischemic cardiomyopathy (HCC)   . Insomnia   . Obesity   . AICD (automatic cardioverter/defibrillator) present   . Hypertension   . Vitamin D deficiency   . Sleep apnea     no cpap  .  Dementia   . Depressive disorder   . LBBB (left bundle branch block)     Past Surgical History  Procedure Laterality Date  . Total knee arthroplasty      left  . Back surgery    . Cardiac defibrillator placement      ST Jude  . Esophagogastroduodenoscopy  06/28/2012    Procedure: ESOPHAGOGASTRODUODENOSCOPY (EGD);  Surgeon: Petra Kuba, MD;  Location: [facility] ENDOSCOPY;  Service: Endoscopy;  Laterality: N/A;  . Hernia repair      Patient Care Team: Kirt Boys, DO as PCP - General (Internal Medicine) Sharee Holster, NP as Nurse Practitioner (Nurse Practitioner)  Social History   Social History  . Marital Status: Married    Spouse Name: N/A  . Number of Children: N/A  . Years of Education: N/A   Occupational History  . Not on file.   Social History Main Topics  . Smoking status: Never Smoker   . Smokeless tobacco: Never Used  . Alcohol Use: No  . Drug Use: No  . Sexual Activity: Not on file   Other Topics Concern  . Not on file   Social History Narrative     reports that he has never smoked. He has never used smokeless tobacco. He reports that he does not drink alcohol or use  illicit drugs.  Immunization History  Administered Date(s) Administered  . Influenza Split 10/22/2011, 09/26/2012, 10/14/2013  . Influenza-Unspecified 10/06/2014, 09/16/2015  . PPD Test 10/20/2010  . Pneumococcal Conjugate-13 08/24/2009  . Pneumococcal-Unspecified 10/12/2014    Allergies  Allergen Reactions  . Morphine Other (See Comments)    Per MAR  . Wellbutrin [Bupropion] Other (See Comments)    Per MAR     Medications: Patient's Medications  New Prescriptions   No medications on file  Previous Medications   AMIODARONE (PACERONE) 200 MG TABLET    Take 1 tablet (200 mg total) by mouth daily. Start amiodarone  daily on 12/24/14   AMLODIPINE (NORVASC) 2.5 MG TABLET    Take 2.5 mg by mouth daily.   ASPIRIN EC 81 MG TABLET    Take 1 tablet (81 mg total) by mouth daily.    CALCIUM CARB-CHOLECALCIFEROL (CALCIUM-VITAMIN D) 500-400 MG-UNIT TABS    Take 1 tablet by mouth daily.   DIGOXIN (LANOXIN) 0.125 MG TABLET    Take 1 tablet (0.125 mg total) by mouth daily.   DIPHENHYDRAMINE (BENADRYL) 25 MG TABLET    Take 1 tablet (25 mg total) by mouth 2 (two) times daily as needed for itching.   FUROSEMIDE (LASIX) 40 MG TABLET    Take 1 tablet (40 mg total) by mouth daily.   IRON POLYSACCHARIDES (NIFEREX) 150 MG CAPSULE    Take 150 mg by mouth daily.   LEVOTHYROXINE (LEVOTHROID) 25 MCG TABLET    Take 1 tablet (25 mcg total) by mouth daily before breakfast.   LORAZEPAM (ATIVAN) 0.5 MG TABLET    1 by mouth twice daily and 1 by mouth every 8 hours as needed   METOPROLOL TARTRATE (LOPRESSOR) 25 MG TABLET    Take 1 tablet (25 mg total) by mouth 2 (two) times daily.   POLYETHYLENE GLYCOL (MIRALAX / GLYCOLAX) PACKET    Take 17 g by mouth daily.   POTASSIUM CHLORIDE SA (K-DUR,KLOR-CON) 20 MEQ TABLET    Take 2 tablets (40 mEq total) by mouth daily.   SENNA (SENOKOT) 8.6 MG TABLET    Take 2 tablets by mouth daily.   TAMSULOSIN HCL (FLOMAX) 0.4 MG CAPS    Take 1 capsule (0.4 mg total) by mouth daily after supper.   TRAMADOL (ULTRAM) 50 MG TABLET    Take 1 tablet (50 mg total) by mouth every 6 (six) hours as needed. For pain  Modified Medications   No medications on file  Discontinued Medications   No medications on file    Review of Systems  Unable to perform ROS: Mental status change    Filed Vitals:   11/15/15 1436  BP: 122/68  Pulse: 76  Temp: 97.9 F (36.6 C)  Weight: 138 lb (62.596 kg)   Body mass index is 22.28 kg/(m^2).  Physical Exam  Constitutional: He is oriented to person, place, and time. He appears well-developed.  Frail appearing, lying in bed in NAD  HENT:  Mouth/Throat: Oropharynx is clear and moist.  Eyes: Pupils are equal, round, and reactive to light. No scleral icterus.  Neck: Neck supple. Carotid bruit is not present. No thyromegaly present.    Cardiovascular: Normal rate, regular rhythm and intact distal pulses.  Exam reveals no gallop and no friction rub.   Murmur (1/6 SEM) heard. no distal LE swelling. No calf TTP  Pulmonary/Chest: Effort normal and breath sounds normal. He has no wheezes. He has no rales. He exhibits no tenderness.  Abdominal: Soft. Bowel sounds are normal.  He exhibits no distension, no abdominal bruit, no pulsatile midline mass and no mass. There is no tenderness. There is no rebound and no guarding.  Musculoskeletal: He exhibits tenderness.  Lymphadenopathy:    He has no cervical adenopathy.  Neurological: He is alert and oriented to person, place, and time. He has normal reflexes.  Skin: Skin is warm and dry. Rash (contact dermatitis. no vesicular formation) noted.  Psychiatric: He has a normal mood and affect. His behavior is normal.     Labs reviewed: No visits with results within 3 Month(s) from this visit. Latest known visit with results is:  Orders Only on 01/19/2015  Component Date Value Ref Range Status  . TSH 01/19/2015 CANCELED  0.350 - 4.500 uIU/mL Final   Comment: Patient refused.  Result canceled by the ancillary     No results found.   Assessment/Plan   ICD-9-CM ICD-10-CM   1. Confusion state - probably due to #2 298.9 F44.89   2. Alzheimer's dementia 331.0 G30.9    294.10 F02.80     Check cbc, cmp, ua to r/o metabolic cause  Cont current meds as ordered  Observation warranted at this time. No further interventions recommended. Family has refused to put pt on antipsychotic in the past  Will follow  Kerim Statzer S. Ancil Linsey  [facility] and Adult Medicine [address] [place], [place] [address] [phone] Cell (Monday-Friday 8 AM - 5 PM) [phone] After 5 PM and follow prompts

## 2015-12-05 ENCOUNTER — Non-Acute Institutional Stay (SKILLED_NURSING_FACILITY): Payer: Medicare Other | Admitting: Adult Health

## 2015-12-05 DIAGNOSIS — E039 Hypothyroidism, unspecified: Secondary | ICD-10-CM | POA: Diagnosis not present

## 2015-12-05 DIAGNOSIS — R338 Other retention of urine: Secondary | ICD-10-CM

## 2015-12-05 DIAGNOSIS — I5041 Acute combined systolic (congestive) and diastolic (congestive) heart failure: Secondary | ICD-10-CM

## 2015-12-05 DIAGNOSIS — I482 Chronic atrial fibrillation, unspecified: Secondary | ICD-10-CM

## 2015-12-05 DIAGNOSIS — Z95810 Presence of automatic (implantable) cardiac defibrillator: Secondary | ICD-10-CM | POA: Diagnosis not present

## 2015-12-05 DIAGNOSIS — F0280 Dementia in other diseases classified elsewhere without behavioral disturbance: Secondary | ICD-10-CM | POA: Diagnosis not present

## 2015-12-05 DIAGNOSIS — F419 Anxiety disorder, unspecified: Secondary | ICD-10-CM | POA: Diagnosis not present

## 2015-12-05 DIAGNOSIS — I10 Essential (primary) hypertension: Secondary | ICD-10-CM | POA: Diagnosis not present

## 2015-12-05 DIAGNOSIS — N401 Enlarged prostate with lower urinary tract symptoms: Secondary | ICD-10-CM

## 2015-12-05 DIAGNOSIS — N400 Enlarged prostate without lower urinary tract symptoms: Secondary | ICD-10-CM | POA: Diagnosis not present

## 2015-12-05 DIAGNOSIS — G309 Alzheimer's disease, unspecified: Secondary | ICD-10-CM

## 2015-12-23 ENCOUNTER — Encounter: Payer: Self-pay | Admitting: Adult Health

## 2015-12-23 NOTE — Progress Notes (Signed)
Patient ID: Kevin [name], male   DOB: October 10, 1925, 81 y.o.   MRN: [id number]    Facility[facility]      Allergies  Allergen Reactions  . Morphine Other (See Comments)    Per MAR  . Wellbutrin [Bupropion] Other (See Comments)    Per [facility]     Chief Complaint  Patient presents with  . Annual Exam    HPI:  He is a long term resident of this facility being seen for his annual review.  He will decline blood draws. His coumadin therapy was stopped this past year as he was declining blood draws. His weight is presently 138 pounds. He is unable to fully participate in the hpi or ros. There are no nursing concerns at this time     Past Medical History  Diagnosis Date  . UTI (lower urinary tract infection)   . Nonischemic cardiomyopathy (HCC)   . Insomnia   . Obesity   . AICD (automatic cardioverter/defibrillator) present   . Hypertension   . Vitamin D deficiency   . Sleep apnea     no cpap  . Dementia   . Depressive disorder   . LBBB (left bundle branch block)     Past Surgical History  Procedure Laterality Date  . Total knee arthroplasty      left  . Back surgery    . Cardiac defibrillator placement      ST Jude  . Esophagogastroduodenoscopy  06/28/2012    Procedure: ESOPHAGOGASTRODUODENOSCOPY (EGD);  Surgeon: Petra Kuba, MD;  Location: [facility] ENDOSCOPY;  Service: Endoscopy;  Laterality: N/A;  . Hernia repair      VITAL SIGNS BP 112/66 mmHg  Pulse 71  Ht  (1.676 m)  Wt 138 lb (62.596 kg)  BMI 22.28 kg/m2  SpO2 96%  Patient's Medications  New Prescriptions   No medications on file  Previous Medications   AMIODARONE (PACERONE) 200 MG TABLET    Take 1 tablet (200 mg total) by mouth daily. Start amiodarone  daily on 12/24/14   AMLODIPINE (NORVASC) 2.5 MG TABLET    Take 2.5 mg by mouth daily.   ASPIRIN EC 81 MG TABLET    Take 1 tablet (81 mg total) by mouth daily.   CALCIUM CARB-CHOLECALCIFEROL (CALCIUM-VITAMIN D) 500-400 MG-UNIT TABS    Take 1 tablet by  mouth daily.   DIGOXIN (LANOXIN) 0.125 MG TABLET    Take 1 tablet (0.125 mg total) by mouth daily.   DIPHENHYDRAMINE (BENADRYL) 25 MG TABLET    Take 1 tablet (25 mg total) by mouth 2 (two) times daily as needed for itching.   FUROSEMIDE (LASIX) 40 MG TABLET    Take 1 tablet (40 mg total) by mouth daily.   IRON POLYSACCHARIDES (NIFEREX) 150 MG CAPSULE    Take 150 mg by mouth daily.   LEVOTHYROXINE (LEVOTHROID) 25 MCG TABLET    Take 1 tablet (25 mcg total) by mouth daily before breakfast.   LORAZEPAM (ATIVAN) 0.5 MG TABLET    1 by mouth twice daily and 1 by mouth every 8 hours as needed   METOPROLOL TARTRATE (LOPRESSOR) 25 MG TABLET    Take 1 tablet (25 mg total) by mouth 2 (two) times daily.   POLYETHYLENE GLYCOL (MIRALAX / GLYCOLAX) PACKET    Take 17 g by mouth daily.   POTASSIUM CHLORIDE SA (K-DUR,KLOR-CON) 20 MEQ TABLET    Take 2 tablets (40 mEq total) by mouth daily.   SENNA (SENOKOT) 8.6 MG TABLET  Take 2 tablets by mouth daily.   TAMSULOSIN HCL (FLOMAX) 0.4 MG CAPS    Take 1 capsule (0.4 mg total) by mouth daily after supper.   TRAMADOL (ULTRAM) 50 MG TABLET    Take 1 tablet (50 mg total) by mouth every 6 (six) hours as needed. For pain  Modified Medications   No medications on file  Discontinued Medications   No medications on file     SIGNIFICANT DIAGNOSTIC EXAMS   LABS REVIEWED: WILL DECLINE LABS    11-22-14: glucose 90; bun 24; creat 1.38; k+4.6; na++138 11-26-14: wbc 3.6; hgb 11.8; hct 38; mcv 79.3; plt 320; glucose 129; bun 25; creat 1.24; k+4.7; na++141 liver normal albumin 3.2; tsh 7.394; digoxin 1.6  12-08-14: inr 5.15  03-28-15: urine culture: e-coli: rocephin 04-06-15: wbc 4.1; hgb 10.4; hct 33.4; mcv 81.3; plt 310; glucose 80; bun 22; creat 1.10; k+4.2; na++139; liver normal albumin 3.0; tsh 4.405; dig 0.7      Review of Systems Unable to perform ROS: Dementia      Physical Exam Constitutional: No distress.  Eyes: Conjunctivae are normal.  Neck: Neck supple.  No JVD present. No thyromegaly present.  Cardiovascular: Normal rate and intact distal pulses.   Heart rate slightly irregular   Respiratory: Effort normal and breath sounds normal. No respiratory distress. He has no wheezes.  GI: Soft. Bowel sounds are normal. He exhibits no distension. There is no tenderness.  Musculoskeletal: He exhibits no edema.  Able to move all extremities   Lymphadenopathy:    He has no cervical adenopathy.  Neurological: He is alert.  Skin: Skin is warm and dry. He is not diaphoretic.  Psychiatric: He has a normal mood and affect.      ASSESSMENT/ PLAN:  1. Afib: his heart rate is stable; will continue amiodarone 200 mg daily; digoxin 0.125 mg daily lopressor 25 mg twice daily for rate control asa 81 mg daily will monitor dig level is 0.7  Has biventricular ICD in place   2. Diastolic heart failure: is stable will continue  lasix  20 mg daily with k+ 40 meq daily will monitor his status. He is status post icd placement   3. Hypothyroidism: will continue synthroid 25 mcg daily  tsh is 4.405   4. Alzheimer's disease: his disease state is advanced he is not taking medications at this time; will not make changes will monitor his status.   5. Bph: will continue flomax 0.4 mg daily   6. Anemia: will continue nu-iron daily hgb is 10.4   7. Constipation: will continue miralax daily; senna 2 tabs daily   8. Anxiety: will continue  ativan  0.5 mg twice daily and every 8 hours as needed his abilify was stopped; will not make changes will monitor  9. Hypertension: will continue lopressor 25 mg twice daily will monitor      Time spent with patient 40   minutes >50% time spent counseling; reviewing medical record; tests; labs; and developing future plan of care     [name] NP [facility] Adult Medicine  Contact [phone] Monday through Friday 8am- 5pm  After hours call [phone]

## 2015-12-27 ENCOUNTER — Other Ambulatory Visit: Payer: Self-pay | Admitting: *Deleted

## 2015-12-27 MED ORDER — LORAZEPAM 0.5 MG PO TABS: ORAL_TABLET | ORAL | Status: DC

## 2015-12-27 NOTE — Telephone Encounter (Signed)
[facility]

## 2016-01-24 ENCOUNTER — Non-Acute Institutional Stay (SKILLED_NURSING_FACILITY): Payer: Medicare Other | Admitting: Internal Medicine

## 2016-01-24 ENCOUNTER — Encounter: Payer: Self-pay | Admitting: Internal Medicine

## 2016-01-24 DIAGNOSIS — E039 Hypothyroidism, unspecified: Secondary | ICD-10-CM

## 2016-01-24 DIAGNOSIS — I10 Essential (primary) hypertension: Secondary | ICD-10-CM

## 2016-01-24 DIAGNOSIS — G309 Alzheimer's disease, unspecified: Secondary | ICD-10-CM | POA: Diagnosis not present

## 2016-01-24 DIAGNOSIS — F0280 Dementia in other diseases classified elsewhere without behavioral disturbance: Secondary | ICD-10-CM | POA: Diagnosis not present

## 2016-01-24 DIAGNOSIS — F419 Anxiety disorder, unspecified: Secondary | ICD-10-CM | POA: Diagnosis not present

## 2016-01-24 DIAGNOSIS — I482 Chronic atrial fibrillation, unspecified: Secondary | ICD-10-CM

## 2016-01-24 DIAGNOSIS — Z95810 Presence of automatic (implantable) cardiac defibrillator: Secondary | ICD-10-CM | POA: Diagnosis not present

## 2016-01-24 DIAGNOSIS — B351 Tinea unguium: Secondary | ICD-10-CM | POA: Diagnosis not present

## 2016-01-24 NOTE — Progress Notes (Signed)
Patient ID: Kevin [name], male   DOB: December 29, 1924, 82 y.o.   MRN: [id number]    DATE: 01/24/16  Location:  [facility]    Place of Service: SNF [phone])   Extended Emergency Contact Information Primary Emergency Contact: [place] Address: [address]          [place], [place] [address] Darden Amber of [place] Home Phone: [phone] Mobile Phone: [phone] Relation: Daughter Secondary Emergency Contact: Dionisio Paschal Address: [address]          [place], [place] [address] Darden Amber of [place] Home Phone: [phone] Mobile Phone: [phone] Relation: Relative  Advanced Directive information  DNR  Chief Complaint  Patient presents with  . Medical Management of Chronic Issues    HPI:  82 yo male long term resident seen today for f/u. He is c/a thick toenails and would like to see podiatry. They are not painful. No nursing issues. Appetite is reduced. He has nutritional supplements. He is a poor historian due to dementia. Hx obtained from chart  Afib rate controlled on amiodarone, digoxin and  Lopressor. He takes  asa 81 mg daily. Last dig level  0.7  Has biventricular ICD in place   Diastolic heart failure - stable on lasix with k+. He is s/p AICD  Hypothyroidism - stable on synthroid. Last TSH 4.405   Alzheimer's disease - his disease state is advanced he is not taking medications at this time. He is on nutritional supplements  BPH - sx's controlled on flomax    Anemia - stable on nu-iron. last hgb 10.4   Constipation - stable on miralax and senna   Anxiety - stable on ativan  0.5 mg twice daily and every 8 hours as needed; followed by psych services  Hypertension - BP stable on lopressor    Past Medical History  Diagnosis Date  . UTI (lower urinary tract infection)   . Nonischemic cardiomyopathy (HCC)   . Insomnia   . Obesity   . AICD (automatic cardioverter/defibrillator) present   . Hypertension   . Vitamin D deficiency     . Sleep apnea     no cpap  . Dementia   . Depressive disorder   . LBBB (left bundle branch block)     Past Surgical History  Procedure Laterality Date  . Total knee arthroplasty      left  . Back surgery    . Cardiac defibrillator placement      ST Jude  . Esophagogastroduodenoscopy  06/28/2012    Procedure: ESOPHAGOGASTRODUODENOSCOPY (EGD);  Surgeon: Petra Kuba, MD;  Location: [facility] ENDOSCOPY;  Service: Endoscopy;  Laterality: N/A;  . Hernia repair      Patient Care Team: Kirt Boys, DO as PCP - General (Internal Medicine) Sharee Holster, NP as Nurse Practitioner (Nurse Practitioner) [facility] (Skilled Nursing Facility)  Social History   Social History  . Marital Status: Married    Spouse Name: N/A  . Number of Children: N/A  . Years of Education: N/A   Occupational History  . Not on file.   Social History Main Topics  . Smoking status: Never Smoker   . Smokeless tobacco: Never Used  . Alcohol Use: No  . Drug Use: No  . Sexual Activity: Not on file   Other Topics Concern  . Not on file   Social History Narrative     reports that he has never smoked. He has never used smokeless tobacco. He  reports that he does not drink alcohol or use illicit drugs.  Immunization History  Administered Date(s) Administered  . Influenza Split 10/22/2011, 09/26/2012, 10/14/2013  . Influenza-Unspecified 10/06/2014, 09/16/2015  . PPD Test 10/20/2010  . Pneumococcal Conjugate-13 08/24/2009  . Pneumococcal-Unspecified 10/12/2014    Allergies  Allergen Reactions  . Morphine Other (See Comments)    Per MAR  . Wellbutrin [Bupropion] Other (See Comments)    Per MAR     Medications: Patient's Medications  New Prescriptions   No medications on file  Previous Medications   AMIODARONE (PACERONE) 200 MG TABLET    Take 1 tablet (200 mg total) by mouth daily. Start amiodarone 200mg  daily on 12/24/14   AMLODIPINE (NORVASC) 2.5 MG TABLET    Take 2.5 mg by  mouth daily.   ASPIRIN EC 81 MG TABLET    Take 1 tablet (81 mg total) by mouth daily.   CALCIUM CARB-CHOLECALCIFEROL (CALCIUM-VITAMIN D) 500-400 MG-UNIT TABS    Take 1 tablet by mouth daily.   DIGOXIN (LANOXIN) 0.125 MG TABLET    Take 1 tablet (0.125 mg total) by mouth daily.   DIPHENHYDRAMINE (BENADRYL) 25 MG TABLET    Take 1 tablet (25 mg total) by mouth 2 (two) times daily as needed for itching.   FUROSEMIDE (LASIX) 40 MG TABLET    Take 1 tablet (40 mg total) by mouth daily.   IRON POLYSACCHARIDES (NIFEREX) 150 MG CAPSULE    Take 150 mg by mouth daily.   LEVOTHYROXINE (LEVOTHROID) 25 MCG TABLET    Take 1 tablet (25 mcg total) by mouth daily before breakfast.   LORAZEPAM (ATIVAN) 0.5 MG TABLET    Take one tablet by mouth every 8 hours as needed for anxiety   METOPROLOL TARTRATE (LOPRESSOR) 25 MG TABLET    Take 1 tablet (25 mg total) by mouth 2 (two) times daily.   POLYETHYLENE GLYCOL (MIRALAX / GLYCOLAX) PACKET    Take 17 g by mouth daily.   POTASSIUM CHLORIDE SA (K-DUR,KLOR-CON) 20 MEQ TABLET    Take 2 tablets (40 mEq total) by mouth daily.   SENNA (SENOKOT) 8.6 MG TABLET    Take 2 tablets by mouth daily.   TAMSULOSIN HCL (FLOMAX) 0.4 MG CAPS    Take 1 capsule (0.4 mg total) by mouth daily after supper.   TRAMADOL (ULTRAM) 50 MG TABLET    Take 1 tablet (50 mg total) by mouth every 6 (six) hours as needed. For pain  Modified Medications   No medications on file  Discontinued Medications   No medications on file    Review of Systems  Unable to perform ROS: Dementia    Filed Vitals:   01/24/16 1500  BP: 130/78  Pulse: 78  Temp: 97.1 F (36.2 C)  Weight: 129 lb (58.514 kg)  SpO2: 96%   Body mass index is 20.83 kg/(m^2).  Physical Exam  Constitutional: He appears well-developed.  Frail appearing, lying in bed resting but easily awakened. NAD  HENT:  Mouth/Throat: Oropharynx is clear and moist.  Eyes: Pupils are equal, round, and reactive to light. No scleral icterus.  Neck:  Neck supple. Carotid bruit is not present. No thyromegaly present.  Cardiovascular: Normal rate, regular rhythm and intact distal pulses.  Exam reveals no gallop and no friction rub.   Murmur (1/6 SEM) heard. no distal LE swelling. No calf TTP  Pulmonary/Chest: Effort normal and breath sounds normal. He has no wheezes. He has no rales. He exhibits no tenderness.  ICD palpable in left  ACW  Abdominal: Soft. Bowel sounds are normal. He exhibits no distension, no abdominal bruit, no pulsatile midline mass and no mass. There is no tenderness. There is no rebound and no guarding.  Musculoskeletal: He exhibits edema.  Lymphadenopathy:    He has no cervical adenopathy.  Neurological: He is alert.  Skin: Skin is warm and dry. No rash noted.  B/l thick, yellowish toenails but no ingrown toenails  Psychiatric: He has a normal mood and affect. His behavior is normal.     Labs reviewed: No visits with results within 3 Month(s) from this visit. Latest known visit with results is:  Orders Only on 01/19/2015  Component Date Value Ref Range Status  . TSH 01/19/2015 CANCELED  0.350 - 4.500 uIU/mL Final   Comment: Patient refused.  Result canceled by the ancillary     No results found.   Assessment/Plan   ICD-9-CM ICD-10-CM   1. Onychomycosis 110.1 B35.1   2. Alzheimer's dementia 331.0 G30.9    294.10 F02.80   3. Chronic atrial fibrillation (HCC) 427.31 I48.2   4. Hypothyroidism, unspecified hypothyroidism type 244.9 E03.9   5. Essential hypertension, benign 401.1 I10   6. Anxiety 300.00 F41.9   7. Biventricular ICD (implantable cardioverter-defibrillator) in place V45.02 Z95.810     Will have facility podiatrist see him for thick toenails  Will need labs in April 2017  Cont current meds as ordered  PT/OT as indicated  Nutritional supplements as ordered  Will follow  Tranisha Tissue S. Ancil Linsey  [facility] and Adult Medicine [address] [place], [place] [address] [phone] Cell (Monday-Friday 8 AM - 5 PM) [phone] After 5 PM and follow prompts

## 2016-01-27 LAB — CBC AND DIFFERENTIAL
HEMATOCRIT: 37 % — AB (ref 41–53)
Hemoglobin: 12.2 g/dL — AB (ref 13.5–17.5)
Platelets: 209 10*3/uL (ref 150–399)
WBC: 4.3 10^3/mL

## 2016-01-27 LAB — BASIC METABOLIC PANEL
BUN: 19 mg/dL (ref 4–21)
CREATININE: 1 mg/dL (ref 0.6–1.3)
GLUCOSE: 78 mg/dL
Potassium: 3.8 mmol/L (ref 3.4–5.3)
SODIUM: 141 mmol/L (ref 137–147)

## 2016-01-27 LAB — LIPID PANEL
Cholesterol: 135 mg/dL (ref 0–200)
HDL: 45 mg/dL (ref 35–70)
LDL CALC: 79 mg/dL
Triglycerides: 54 mg/dL (ref 40–160)

## 2016-01-30 ENCOUNTER — Non-Acute Institutional Stay (SKILLED_NURSING_FACILITY): Payer: Medicare Other | Admitting: Adult Health

## 2016-01-30 DIAGNOSIS — I5041 Acute combined systolic (congestive) and diastolic (congestive) heart failure: Secondary | ICD-10-CM

## 2016-01-30 DIAGNOSIS — Z95810 Presence of automatic (implantable) cardiac defibrillator: Secondary | ICD-10-CM | POA: Diagnosis not present

## 2016-01-30 DIAGNOSIS — I10 Essential (primary) hypertension: Secondary | ICD-10-CM | POA: Diagnosis not present

## 2016-01-30 DIAGNOSIS — G309 Alzheimer's disease, unspecified: Secondary | ICD-10-CM | POA: Diagnosis not present

## 2016-01-30 DIAGNOSIS — E039 Hypothyroidism, unspecified: Secondary | ICD-10-CM | POA: Diagnosis not present

## 2016-01-30 DIAGNOSIS — I482 Chronic atrial fibrillation, unspecified: Secondary | ICD-10-CM

## 2016-01-30 DIAGNOSIS — R338 Other retention of urine: Secondary | ICD-10-CM

## 2016-01-30 DIAGNOSIS — F0280 Dementia in other diseases classified elsewhere without behavioral disturbance: Secondary | ICD-10-CM | POA: Diagnosis not present

## 2016-01-30 DIAGNOSIS — N401 Enlarged prostate with lower urinary tract symptoms: Secondary | ICD-10-CM

## 2016-01-30 DIAGNOSIS — N400 Enlarged prostate without lower urinary tract symptoms: Secondary | ICD-10-CM | POA: Diagnosis not present

## 2016-02-16 LAB — HM DIABETES FOOT EXAM

## 2016-03-05 ENCOUNTER — Non-Acute Institutional Stay (SKILLED_NURSING_FACILITY): Payer: Medicare Other | Admitting: Adult Health

## 2016-03-05 ENCOUNTER — Encounter: Payer: Self-pay | Admitting: Adult Health

## 2016-03-05 DIAGNOSIS — I10 Essential (primary) hypertension: Secondary | ICD-10-CM | POA: Diagnosis not present

## 2016-03-05 DIAGNOSIS — Z95810 Presence of automatic (implantable) cardiac defibrillator: Secondary | ICD-10-CM

## 2016-03-05 DIAGNOSIS — F0280 Dementia in other diseases classified elsewhere without behavioral disturbance: Secondary | ICD-10-CM | POA: Diagnosis not present

## 2016-03-05 DIAGNOSIS — I5041 Acute combined systolic (congestive) and diastolic (congestive) heart failure: Secondary | ICD-10-CM | POA: Diagnosis not present

## 2016-03-05 DIAGNOSIS — E034 Atrophy of thyroid (acquired): Secondary | ICD-10-CM

## 2016-03-05 DIAGNOSIS — R338 Other retention of urine: Secondary | ICD-10-CM

## 2016-03-05 DIAGNOSIS — I482 Chronic atrial fibrillation, unspecified: Secondary | ICD-10-CM

## 2016-03-05 DIAGNOSIS — G309 Alzheimer's disease, unspecified: Secondary | ICD-10-CM | POA: Diagnosis not present

## 2016-03-05 DIAGNOSIS — N401 Enlarged prostate with lower urinary tract symptoms: Secondary | ICD-10-CM

## 2016-03-05 DIAGNOSIS — N400 Enlarged prostate without lower urinary tract symptoms: Secondary | ICD-10-CM | POA: Diagnosis not present

## 2016-03-05 DIAGNOSIS — E038 Other specified hypothyroidism: Secondary | ICD-10-CM | POA: Diagnosis not present

## 2016-03-05 NOTE — Progress Notes (Signed)
Patient ID: Kevin [name], male   DOB: 30-Dec-1924, 82 y.o.   MRN: [id number]    [facility]       Allergies  Allergen Reactions  . Morphine Other (See Comments)    Per MAR  . Wellbutrin [Bupropion] Other (See Comments)    Per [facility]     Chief Complaint  Patient presents with  . Medical Management of Chronic Issues    HPI:  He is a long term resident of this facility being seen for the management of his chronic illnesses. Overall there is little change in his status. He cannot fully participate in the hpi or ors; but did tell me that he is doing "ok". There are no nursing concerns at this time.   Past Medical History  Diagnosis Date  . UTI (lower urinary tract infection)   . Nonischemic cardiomyopathy (HCC)   . Insomnia   . Obesity   . AICD (automatic cardioverter/defibrillator) present   . Hypertension   . Vitamin D deficiency   . Sleep apnea     no cpap  . Dementia   . Depressive disorder   . LBBB (left bundle branch block)     Past Surgical History  Procedure Laterality Date  . Total knee arthroplasty      left  . Back surgery    . Cardiac defibrillator placement      [facility] Jude  . Esophagogastroduodenoscopy  06/28/2012    Procedure: ESOPHAGOGASTRODUODENOSCOPY (EGD);  Surgeon: Petra Kuba, MD;  Location: [facility] [place] ENDOSCOPY;  Service: Endoscopy;  Laterality: N/A;  . Hernia repair      VITAL SIGNS BP 102/67 mmHg  Pulse 72  Temp(Src) 97 F (36.1 C)  Ht 5\' 6"  (1.676 m)  Wt 129 lb (58.514 kg)  BMI 20.83 kg/m2  Patient's Medications  New Prescriptions   No medications on file  Previous Medications   AMIODARONE (PACERONE) 200 MG TABLET    Take 1 tablet (200 mg total) by mouth daily. Start amiodarone 200mg  daily on 12/24/14   AMLODIPINE (NORVASC) 2.5 MG TABLET    Take 2.5 mg by mouth daily.   ASPIRIN EC 81 MG TABLET    Take 1 tablet (81 mg total) by mouth daily.   CARBOXYMETHYLCELLULOSE (REFRESH TEARS) 0.5 % SOLN    Place 1 drop into both eyes 3 (three) times  daily as needed.   DIGOXIN (LANOXIN) 0.125 MG TABLET    Take 1 tablet (0.125 mg total) by mouth daily.   DIPHENHYDRAMINE (BENADRYL) 25 MG TABLET    Take 1 tablet (25 mg total) by mouth 2 (two) times daily as needed for itching.   FUROSEMIDE (LASIX) 20 MG TABLET    Take 20 mg by mouth daily.   GUAIFENESIN (ROBITUSSIN) 100 MG/5ML LIQUID    Take 10 mLs by mouth every 6 (six) hours as needed for cough.   IRON POLYSACCHARIDES (NIFEREX) 150 MG CAPSULE    Take 150 mg by mouth daily.   LEVOTHYROXINE (LEVOTHROID) 25 MCG TABLET    Take 1 tablet (25 mcg total) by mouth daily before breakfast.   LORAZEPAM (ATIVAN) 0.5 MG TABLET    Take one tablet by mouth every 8 hours as needed for anxiety   LORAZEPAM (ATIVAN) 0.5 MG TABLET    Take 0.5 mg by mouth 2 (two) times daily.   MAGNESIUM HYDROXIDE (MILK OF MAGNESIA) 400 MG/5ML SUSPENSION    Take 30 mLs by mouth daily as needed for mild constipation.   METOPROLOL TARTRATE (LOPRESSOR) 25  MG TABLET    Take 1 tablet (25 mg total) by mouth 2 (two) times daily.   OSELTAMIVIR (TAMIFLU) 75 MG CAPSULE    Take 75 mg by mouth daily. X 14 days   POLYETHYLENE GLYCOL (MIRALAX / GLYCOLAX) PACKET    Take 17 g by mouth daily.   POTASSIUM CHLORIDE SA (K-DUR,KLOR-CON) 20 MEQ TABLET    Take 2 tablets (40 mEq total) by mouth daily.   SENNA (SENOKOT) 8.6 MG TABLET    Take 2 tablets by mouth daily.   TAMSULOSIN HCL (FLOMAX) 0.4 MG CAPS    Take 1 capsule (0.4 mg total) by mouth daily after supper.   TRAMADOL (ULTRAM) 50 MG TABLET    Take 1 tablet (50 mg total) by mouth every 6 (six) hours as needed. For pain   VITAMIN D, CHOLECALCIFEROL, 400 UNITS TABS    Take 1 tablet by mouth daily.  Modified Medications   No medications on file  Discontinued Medications   No medications on file     SIGNIFICANT DIAGNOSTIC EXAMS    LABS REVIEWED: WILL DECLINE LABS    11-22-14: glucose 90; bun 24; creat 1.38; k+4.6; na++138 11-26-14: wbc 3.6; hgb 11.8; hct 38; mcv 79.3; plt 320; glucose 129;  bun 25; creat 1.24; k+4.7; na++141 liver normal albumin 3.2; tsh 7.394; digoxin 1.6  12-08-14: inr 5.15  03-28-15: urine culture: e-coli: rocephin 04-06-15: wbc 4.1; hgb 10.4; hct 33.4; mcv 81.3; plt 310; glucose 80; bun 22; creat 1.10; k+4.2; na++139; liver normal albumin 3.0; tsh 4.405; dig 0.7      Review of Systems Unable to perform ROS: Dementia      Physical Exam Constitutional: No distress.  Eyes: Conjunctivae are normal.  Neck: Neck supple. No JVD present. No thyromegaly present.  Cardiovascular: Normal rate and intact distal pulses.   Heart rate slightly irregular   Respiratory: Effort normal and breath sounds normal. No respiratory distress. He has no wheezes.  GI: Soft. Bowel sounds are normal. He exhibits no distension. There is no tenderness.  Musculoskeletal: He exhibits no edema.  Able to move all extremities   Lymphadenopathy:    He has no cervical adenopathy.  Neurological: He is alert.  Skin: Skin is warm and dry. He is not diaphoretic.  Psychiatric: He has a normal mood and affect.      ASSESSMENT/ PLAN:  1. Afib: his heart rate is stable; will continue amiodarone 200 mg daily; digoxin 0.125 mg daily lopressor 25 mg twice daily for rate control asa 81 mg daily will monitor dig level is 0.7  Has biventricular ICD in place   2. Diastolic heart failure: is stable will continue  lasix  20 mg daily with k+ 40 meq daily will monitor his status. He is status post icd placement   3. Hypothyroidism: will continue synthroid 25 mcg daily  tsh is 4.405   4. Alzheimer's disease: his disease state is advanced he is not taking medications at this time; will not make changes will monitor his status.   5. Bph: will continue flomax 0.4 mg daily   6. Anemia: will continue nu-iron daily hgb is 10.4   7. Constipation: will continue miralax daily; senna 2 tabs daily   8. Anxiety: will continue  ativan  0.5 mg twice daily and every 8 hours as needed his abilify was stopped;  will not make changes will monitor  9. Hypertension: will continue lopressor 25 mg twice daily will monitor        [name]  NP [facility]  Contact [phone] Monday through Friday 8am- 5pm  After hours call [phone]

## 2016-03-05 NOTE — Progress Notes (Signed)
Patient ID: Kevin [name], male   DOB: 1925-06-21, 82 y.o.   MRN: [id number]   [facility]       Allergies  Allergen Reactions  . Morphine Other (See Comments)    Per MAR  . Wellbutrin [Bupropion] Other (See Comments)    Per [facility]     Chief Complaint  Patient presents with  . Medical Management of Chronic Issues    Follow up    HPI:  He is a long term resident of this facility being seen for the management of his chronic illnesses. Overall there is little change in his status. He is unable to fully participate in the hpi or ros. There are no nursing concerns at this time.    Past Medical History  Diagnosis Date  . UTI (lower urinary tract infection)   . Nonischemic cardiomyopathy (HCC)   . Insomnia   . Obesity   . AICD (automatic cardioverter/defibrillator) present   . Hypertension   . Vitamin D deficiency   . Sleep apnea     no cpap  . Dementia   . Depressive disorder   . LBBB (left bundle branch block)     Past Surgical History  Procedure Laterality Date  . Total knee arthroplasty      left  . Back surgery    . Cardiac defibrillator placement      [facility] Jude  . Esophagogastroduodenoscopy  06/28/2012    Procedure: ESOPHAGOGASTRODUODENOSCOPY (EGD);  Surgeon: Petra Kuba, MD;  Location: [facility] ENDOSCOPY;  Service: Endoscopy;  Laterality: N/A;  . Hernia repair      VITAL SIGNS BP 105/76 mmHg  Pulse 68  Temp(Src) 97.4 F (36.3 C) (Oral)  Resp 18  Ht 5\' 6"  (1.676 m)  Wt 132 lb 6 oz (60.045 kg)  BMI 21.38 kg/m2  SpO2 96%  Patient's Medications  New Prescriptions   No medications on file  Previous Medications   AMIODARONE (PACERONE) 200 MG TABLET    Take 1 tablet (200 mg total) by mouth daily. Start amiodarone 200mg  daily on 12/24/14   AMLODIPINE (NORVASC) 2.5 MG TABLET    Take 2.5 mg by mouth daily.   ASPIRIN EC 81 MG TABLET    Take 1 tablet (81 mg total) by mouth daily.   CARBOXYMETHYLCELLULOSE (REFRESH TEARS) 0.5 % SOLN    Place 1 drop into both eyes  3 (three) times daily as needed.   DIGOXIN (LANOXIN) 0.125 MG TABLET    Take 1 tablet (0.125 mg total) by mouth daily.   DIPHENHYDRAMINE (BENADRYL) 25 MG TABLET    Take 1 tablet (25 mg total) by mouth 2 (two) times daily as needed for itching.   FUROSEMIDE (LASIX) 20 MG TABLET    Take 20 mg by mouth daily.   GUAIFENESIN (ROBITUSSIN) 100 MG/5ML LIQUID    Take 10 mLs by mouth every 6 (six) hours as needed for cough.   IRON POLYSACCHARIDES (NIFEREX) 150 MG CAPSULE    Take 150 mg by mouth daily.   LEVOTHYROXINE (LEVOTHROID) 25 MCG TABLET    Take 1 tablet (25 mcg total) by mouth daily before breakfast.   LORAZEPAM (ATIVAN) 0.5 MG TABLET    Take one tablet by mouth every 8 hours as needed for anxiety   LORAZEPAM (ATIVAN) 0.5 MG TABLET    Take 0.5 mg by mouth 2 (two) times daily.   MAGNESIUM HYDROXIDE (MILK OF MAGNESIA) 400 MG/5ML SUSPENSION    Take 30 mLs by mouth daily as needed for mild  constipation.   METOPROLOL TARTRATE (LOPRESSOR) 25 MG TABLET    Take 1 tablet (25 mg total) by mouth 2 (two) times daily.   OSELTAMIVIR (TAMIFLU) 75 MG CAPSULE    Take 75 mg by mouth daily. X 14 days   POLYETHYLENE GLYCOL (MIRALAX / GLYCOLAX) PACKET    Take 17 g by mouth daily.   POTASSIUM CHLORIDE SA (K-DUR,KLOR-CON) 20 MEQ TABLET    Take 2 tablets (40 mEq total) by mouth daily.   SENNA (SENOKOT) 8.6 MG TABLET    Take 2 tablets by mouth daily.   TAMSULOSIN HCL (FLOMAX) 0.4 MG CAPS    Take 1 capsule (0.4 mg total) by mouth daily after supper.   TRAMADOL (ULTRAM) 50 MG TABLET    Take 1 tablet (50 mg total) by mouth every 6 (six) hours as needed. For pain   VITAMIN D, CHOLECALCIFEROL, 400 UNITS TABS    Take 1 tablet by mouth daily.  Modified Medications   No medications on file  Discontinued Medications     SIGNIFICANT DIAGNOSTIC EXAMS  LABS REVIEWED: WILL DECLINE LABS    03-28-15: urine culture: e-coli: rocephin 04-06-15: wbc 4.1; hgb 10.4; hct 33.4; mcv 81.3; plt 310; glucose 80; bun 22; creat 1.10; k+4.2;  na++139; liver normal albumin 3.0; tsh 4.405; dig 0.7   01-27-16: wbc 4.3; hgb 12.2; hct 36.8; mcv 87.6; plt 209; glucose 78; bun 19; creat 1.05; k+ 3.8; na++141; liver normal albumin 3.0; chol 135; ldl 79; trig 54; hdl 45; tsh 2.954; dig 0.9     Review of Systems Unable to perform ROS: Dementia      Physical Exam Constitutional: No distress.  Eyes: Conjunctivae are normal.  Neck: Neck supple. No JVD present. No thyromegaly present.  Cardiovascular: Normal rate and intact distal pulses.   Heart rate slightly irregular   Respiratory: Effort normal and breath sounds normal. No respiratory distress. He has no wheezes.  GI: Soft. Bowel sounds are normal. He exhibits no distension. There is no tenderness.  Musculoskeletal: He exhibits no edema.  Able to move all extremities   Lymphadenopathy:    He has no cervical adenopathy.  Neurological: He is alert.  Skin: Skin is warm and dry. He is not diaphoretic.  Psychiatric: He has a normal mood and affect.      ASSESSMENT/ PLAN:  1. Afib: his heart rate is stable; will continue amiodarone 200 mg daily; digoxin 0.125 mg daily lopressor 25 mg twice daily for rate control asa 81 mg daily will monitor dig level is 0.9  Has biventricular ICD in place   2. Diastolic heart failure: is stable will continue  lasix  20 mg daily with k+ 40 meq daily will monitor his status. He is status post icd placement   3. Hypothyroidism: will continue synthroid 25 mcg daily  tsh is 2.954  4. Alzheimer's disease: his disease state is advanced he is not taking medications at this time; will not make changes will monitor his status.   5. Bph: will continue flomax 0.4 mg daily   6. Anemia: will continue nu-iron daily hgb is 12.2   7. Constipation: will continue miralax daily; senna 2 tabs daily   8. Anxiety: will continue  ativan  0.5 mg twice daily and every 8 hours as needed his abilify was stopped; will not make changes will monitor  9. Hypertension:  will continue lopressor 25 mg twice daily will monitor      [name] NP [facility]  Contact [phone] Monday  through Friday 8am- 5pm  After hours call [phone]

## 2016-04-04 ENCOUNTER — Encounter: Payer: Self-pay | Admitting: Adult Health

## 2016-04-04 ENCOUNTER — Non-Acute Institutional Stay (SKILLED_NURSING_FACILITY): Payer: Medicare Other | Admitting: Adult Health

## 2016-04-04 DIAGNOSIS — E038 Other specified hypothyroidism: Secondary | ICD-10-CM

## 2016-04-04 DIAGNOSIS — R338 Other retention of urine: Secondary | ICD-10-CM

## 2016-04-04 DIAGNOSIS — N400 Enlarged prostate without lower urinary tract symptoms: Secondary | ICD-10-CM

## 2016-04-04 DIAGNOSIS — E034 Atrophy of thyroid (acquired): Secondary | ICD-10-CM

## 2016-04-04 DIAGNOSIS — I5042 Chronic combined systolic (congestive) and diastolic (congestive) heart failure: Secondary | ICD-10-CM | POA: Diagnosis not present

## 2016-04-04 DIAGNOSIS — Z95810 Presence of automatic (implantable) cardiac defibrillator: Secondary | ICD-10-CM

## 2016-04-04 DIAGNOSIS — I482 Chronic atrial fibrillation, unspecified: Secondary | ICD-10-CM

## 2016-04-04 DIAGNOSIS — G309 Alzheimer's disease, unspecified: Secondary | ICD-10-CM

## 2016-04-04 DIAGNOSIS — I10 Essential (primary) hypertension: Secondary | ICD-10-CM | POA: Diagnosis not present

## 2016-04-04 DIAGNOSIS — N401 Enlarged prostate with lower urinary tract symptoms: Secondary | ICD-10-CM

## 2016-04-04 DIAGNOSIS — F0280 Dementia in other diseases classified elsewhere without behavioral disturbance: Secondary | ICD-10-CM

## 2016-04-04 NOTE — Progress Notes (Signed)
Patient ID: Kevin [name], male   DOB: 08/21/1925, 82 y.o.   MRN: [id number]   [facility]       Allergies  Allergen Reactions  . Morphine Other (See Comments)    Per MAR  . Wellbutrin [Bupropion] Other (See Comments)    Per [facility]     Chief Complaint  Patient presents with  . Medical Management of Chronic Issues    Follow up    HPI:  He is a long term resident of this facility being seen for the management of his chronic illnesses. Overall there is little change in his status. March 2017 his weight was 132 pounds 6 ounces; his current weight is 128 pounds 6 ounces. Weight loss is an unfortunate but expected outcome at end stage Alzheimer's disease.  He is unable to fully participate in the hpi or ros. There are no nursing concerns at this time.   Past Medical History  Diagnosis Date  . UTI (lower urinary tract infection)   . Nonischemic cardiomyopathy (HCC)   . Insomnia   . Obesity   . AICD (automatic cardioverter/defibrillator) present   . Hypertension   . Vitamin D deficiency   . Sleep apnea     no cpap  . Dementia   . Depressive disorder   . LBBB (left bundle branch block)     Past Surgical History  Procedure Laterality Date  . Total knee arthroplasty      left  . Back surgery    . Cardiac defibrillator placement      [facility] Jude  . Esophagogastroduodenoscopy  06/28/2012    Procedure: ESOPHAGOGASTRODUODENOSCOPY (EGD);  Surgeon: Petra Kuba, MD;  Location: [facility] ENDOSCOPY;  Service: Endoscopy;  Laterality: N/A;  . Hernia repair      VITAL SIGNS BP 108/64 mmHg  Pulse 64  Temp(Src) 97.7 F (36.5 C) (Oral)  Resp 18  Ht 5\' 6"  (1.676 m)  Wt 128 lb 6 oz (58.231 kg)  BMI 20.73 kg/m2  SpO2 96%  Patient's Medications  New Prescriptions   No medications on file  Previous Medications   AMIODARONE (PACERONE) 200 MG TABLET    Take 1 tablet (200 mg total) by mouth daily. Start amiodarone 200mg  daily on 12/24/14   AMLODIPINE (NORVASC) 2.5 MG TABLET    Take 2.5 mg  by mouth daily.   ASPIRIN EC 81 MG TABLET    Take 1 tablet (81 mg total) by mouth daily.   CALCIUM CARBONATE (TUMS - DOSED IN MG ELEMENTAL CALCIUM) 500 MG CHEWABLE TABLET    Chew 1 tablet by mouth daily.   CARBOXYMETHYLCELLULOSE (REFRESH TEARS) 0.5 % SOLN    Place 1 drop into both eyes 3 (three) times daily as needed.   DIGOXIN (LANOXIN) 0.125 MG TABLET    Take 1 tablet (0.125 mg total) by mouth daily.   DIPHENHYDRAMINE (BENADRYL) 25 MG TABLET    Take 1 tablet (25 mg total) by mouth 2 (two) times daily as needed for itching.   FUROSEMIDE (LASIX) 20 MG TABLET    Take 20 mg by mouth daily.   GUAIFENESIN (ROBITUSSIN) 100 MG/5ML LIQUID    Take 10 mLs by mouth every 6 (six) hours as needed for cough.   IRON POLYSACCHARIDES (NIFEREX) 150 MG CAPSULE    Take 150 mg by mouth daily.   LEVOTHYROXINE (LEVOTHROID) 25 MCG TABLET    Take 1 tablet (25 mcg total) by mouth daily before breakfast.   LORAZEPAM (ATIVAN) 0.5 MG TABLET  Take one tablet by mouth every 8 hours as needed for anxiety   LORAZEPAM (ATIVAN) 0.5 MG TABLET    Take 0.5 mg by mouth 2 (two) times daily.   MAGNESIUM HYDROXIDE (MILK OF MAGNESIA) 400 MG/5ML SUSPENSION    Take 30 mLs by mouth daily as needed for mild constipation.   METOPROLOL TARTRATE (LOPRESSOR) 25 MG TABLET    Take 1 tablet (25 mg total) by mouth 2 (two) times daily.   POLYETHYLENE GLYCOL (MIRALAX / GLYCOLAX) PACKET    Take 17 g by mouth daily.   POTASSIUM CHLORIDE SA (K-DUR,KLOR-CON) 20 MEQ TABLET    Take 2 tablets (40 mEq total) by mouth daily.   SENNA (SENOKOT) 8.6 MG TABLET    Take 2 tablets by mouth daily.   TAMSULOSIN HCL (FLOMAX) 0.4 MG CAPS    Take 1 capsule (0.4 mg total) by mouth daily after supper.   VITAMIN D, CHOLECALCIFEROL, 400 UNITS TABS    Take 1 tablet by mouth daily.  Modified Medications   No medications on file  Discontinued Medications     SIGNIFICANT DIAGNOSTIC EXAMS  LABS REVIEWED: WILL DECLINE LABS    04-06-15: wbc 4.1; hgb 10.4; hct 33.4; mcv  81.3; plt 310; glucose 80; bun 22; creat 1.10; k+4.2; na++139; liver normal albumin 3.0; tsh 4.405; dig 0.7   01-27-16: wbc 4.3; hgb 12.2; hct 36.8; mcv 87.6; plt 209; glucose 78; bun 19; creat 1.05; k+ 3.8; na++141; liver normal albumin 3.0; chol 135; ldl 79; trig 54; hdl 45; tsh 2.954; dig 0.9     Review of Systems Unable to perform ROS: Dementia      Physical Exam Constitutional: No distress.  Eyes: Conjunctivae are normal.  Neck: Neck supple. No JVD present. No thyromegaly present.  Cardiovascular: Normal rate and intact distal pulses.   Heart rate slightly irregular   Respiratory: Effort normal and breath sounds normal. No respiratory distress. He has no wheezes.  GI: Soft. Bowel sounds are normal. He exhibits no distension. There is no tenderness.  Musculoskeletal: He exhibits no edema.  Able to move all extremities   Lymphadenopathy:    He has no cervical adenopathy.  Neurological: He is alert.  Skin: Skin is warm and dry. He is not diaphoretic.  Psychiatric: He has a normal mood and affect.      ASSESSMENT/ PLAN:  1. Afib: his heart rate is stable; will continue amiodarone 200 mg daily; digoxin 0.125 mg daily lopressor 25 mg twice daily for rate control asa 81 mg daily will monitor dig level is 0.9  Has biventricular ICD in place   2. Diastolic heart failure: is stable will continue  lasix  20 mg daily with k+ 40 meq daily will monitor his status. He is status post icd placement   3. Hypothyroidism: will continue synthroid 25 mcg daily  tsh is 2.954  4. Alzheimer's disease: his disease state is advanced he is not taking medications at this time; will not make changes will monitor his status.   5. Bph: will continue flomax 0.4 mg daily   6. Anemia: will continue nu-iron daily hgb is 12.2   7. Constipation: will continue miralax daily; senna 2 tabs daily   8. Anxiety: will continue  ativan  0.5 mg twice daily and every 8 hours as needed his abilify was stopped; will  not make changes will monitor  9. Hypertension: will continue lopressor 25 mg twice daily and norvasc 2.5 mg daily  will monitor   10. FTT/weight loss:  his weight in March 2017 was 132 pounds 6 ounces his current weight is 128 pounds 6 ounces. Will continue supplements per facility protocol and will monitor his status.       [name] NP [facility]  Contact [phone] Monday through Friday 8am- 5pm  After hours call [phone]

## 2016-04-26 ENCOUNTER — Encounter: Payer: Self-pay | Admitting: Adult Health

## 2016-04-26 ENCOUNTER — Non-Acute Institutional Stay (SKILLED_NURSING_FACILITY): Payer: Medicare Other | Admitting: Adult Health

## 2016-04-26 DIAGNOSIS — K047 Periapical abscess without sinus: Secondary | ICD-10-CM

## 2016-04-26 NOTE — Progress Notes (Signed)
Patient ID: Kevin [name], male   DOB: Sep 21, 1925, 82 y.o.   MRN: [id number]   [facility]     CODE STATUS: DNR  Allergies  Allergen Reactions  . Morphine Other (See Comments)    Per MAR  . Wellbutrin [Bupropion] Other (See Comments)    Per [facility]     Chief Complaint  Patient presents with  . Acute Visit    HPI:  Family reports that he has broken his left upper molar tooth. He is unable to fully participate in the hpi or ros but tell me that his tooth hurts. He is able to eat at this time. He tells me that he will take abt.   Past Medical History  Diagnosis Date  . UTI (lower urinary tract infection)   . Nonischemic cardiomyopathy (HCC)   . Insomnia   . Obesity   . AICD (automatic cardioverter/defibrillator) present   . Hypertension   . Vitamin D deficiency   . Sleep apnea     no cpap  . Dementia   . Depressive disorder   . LBBB (left bundle branch block)     Past Surgical History  Procedure Laterality Date  . Total knee arthroplasty      left  . Back surgery    . Cardiac defibrillator placement      [facility] Jude  . Esophagogastroduodenoscopy  06/28/2012    Procedure: ESOPHAGOGASTRODUODENOSCOPY (EGD);  Surgeon: Petra Kuba, MD;  Location: [facility] ENDOSCOPY;  Service: Endoscopy;  Laterality: N/A;  . Hernia repair      Social History   Social History  . Marital Status: Married    Spouse Name: N/A  . Number of Children: N/A  . Years of Education: N/A   Occupational History  . Not on file.   Social History Main Topics  . Smoking status: Never Smoker   . Smokeless tobacco: Never Used  . Alcohol Use: No  . Drug Use: No  . Sexual Activity: Not on file   Other Topics Concern  . Not on file   Social History Narrative   Family History  Problem Relation Age of Onset  . Stroke Mother   . Lung cancer Brother   . Brain cancer Brother       VITAL SIGNS BP 113/67 mmHg  Pulse 94  Temp(Src) 96.9 F (36.1 C) (Oral)  Resp 18  Ht  (1.676 m)  Wt  132 lb 4 oz (59.988 kg)  BMI 21.36 kg/m2  SpO2 96%  Patient's Medications  New Prescriptions   No medications on file  Previous Medications   AMIODARONE (PACERONE) 200 MG TABLET    Take 1 tablet (200 mg total) by mouth daily. Start amiodarone  daily on 12/24/14   AMLODIPINE (NORVASC) 2.5 MG TABLET    Take 2.5 mg by mouth daily.   ASPIRIN EC 81 MG TABLET    Take 1 tablet (81 mg total) by mouth daily.   CALCIUM CARBONATE (TUMS - DOSED IN MG ELEMENTAL CALCIUM) 500 MG CHEWABLE TABLET    Chew 1 tablet by mouth daily.   CARBOXYMETHYLCELLULOSE (REFRESH TEARS) 0.5 % SOLN    Place 1 drop into both eyes 3 (three) times daily as needed.   DIGOXIN (LANOXIN) 0.125 MG TABLET    Take 1 tablet (0.125 mg total) by mouth daily.   DIPHENHYDRAMINE (BENADRYL) 25 MG TABLET    Take 1 tablet (25 mg total) by mouth 2 (two) times daily as needed for itching.   FUROSEMIDE (  LASIX) 20 MG TABLET    Take 20 mg by mouth daily.   GUAIFENESIN (ROBITUSSIN) 100 MG/5ML LIQUID    Take 10 mLs by mouth every 6 (six) hours as needed for cough.   IRON POLYSACCHARIDES (NIFEREX) 150 MG CAPSULE    Take 150 mg by mouth daily.   LEVOTHYROXINE (LEVOTHROID) 25 MCG TABLET    Take 1 tablet (25 mcg total) by mouth daily before breakfast.   LORAZEPAM (ATIVAN) 0.5 MG TABLET    Take one tablet by mouth every 8 hours as needed for anxiety   LORAZEPAM (ATIVAN) 0.5 MG TABLET    Take 0.5 mg by mouth 2 (two) times daily.   MAGNESIUM HYDROXIDE (MILK OF MAGNESIA) 400 MG/5ML SUSPENSION    Take 30 mLs by mouth daily as needed for mild constipation.   METOPROLOL TARTRATE (LOPRESSOR) 25 MG TABLET    Take 1 tablet (25 mg total) by mouth 2 (two) times daily.   MIRTAZAPINE (REMERON) 7.5 MG TABLET    Take 7.5 mg by mouth at bedtime.   POLYETHYLENE GLYCOL (MIRALAX / GLYCOLAX) PACKET    Take 17 g by mouth daily.   POTASSIUM CHLORIDE SA (K-DUR,KLOR-CON) 20 MEQ TABLET    Take 2 tablets (40 mEq total) by mouth daily.   SENNA (SENOKOT) 8.6 MG TABLET    Take 2  tablets by mouth daily.   TAMSULOSIN HCL (FLOMAX) 0.4 MG CAPS    Take 1 capsule (0.4 mg total) by mouth daily after supper.   VITAMIN D, CHOLECALCIFEROL, 400 UNITS TABS    Take 1 tablet by mouth daily.  Modified Medications   No medications on file  Discontinued Medications   No medications on file     SIGNIFICANT DIAGNOSTIC EXAMS  LABS REVIEWED: WILL DECLINE LABS    04-06-15: wbc 4.1; hgb 10.4; hct 33.4; mcv 81.3; plt 310; glucose 80; bun 22; creat 1.10; k+4.2; na++139; liver normal albumin 3.0; tsh 4.405; dig 0.7   01-27-16: wbc 4.3; hgb 12.2; hct 36.8; mcv 87.6; plt 209; glucose 78; bun 19; creat 1.05; k+ 3.8; na++141; liver normal albumin 3.0; chol 135; ldl 79; trig 54; hdl 45; tsh 2.954; dig 0.9     Review of Systems Unable to perform ROS: Dementia      Physical Exam Constitutional: No distress.  Left upper molar is inflamed; all teeth are in poor condition  Eyes: Conjunctivae are normal.  Neck: Neck supple. No JVD present. No thyromegaly present.  Cardiovascular: Normal rate and intact distal pulses.   Heart rate slightly irregular   Respiratory: Effort normal and breath sounds normal. No respiratory distress. He has no wheezes.  GI: Soft. Bowel sounds are normal. He exhibits no distension. There is no tenderness.  Musculoskeletal: He exhibits no edema.  Able to move all extremities   Lymphadenopathy:    He has no cervical adenopathy.  Neurological: He is alert.  Skin: Skin is warm and dry. He is not diaphoretic.  Psychiatric: He has a normal mood and affect.      ASSESSMENT/ PLAN:  1. Tooth abscess: will begin him on amoxacillin 1 gm today then 500 mg daily for 3 days with florastor twice daily will have him see the dentist and will monitor      [name] NP [facility]  Contact [phone] Monday through Friday 8am- 5pm  After hours call [phone]

## 2016-05-01 ENCOUNTER — Encounter: Payer: Self-pay | Admitting: Internal Medicine

## 2016-05-01 ENCOUNTER — Non-Acute Institutional Stay (SKILLED_NURSING_FACILITY): Payer: Medicare Other | Admitting: Internal Medicine

## 2016-05-01 DIAGNOSIS — R627 Adult failure to thrive: Secondary | ICD-10-CM

## 2016-05-01 DIAGNOSIS — I482 Chronic atrial fibrillation, unspecified: Secondary | ICD-10-CM

## 2016-05-01 DIAGNOSIS — G309 Alzheimer's disease, unspecified: Secondary | ICD-10-CM | POA: Diagnosis not present

## 2016-05-01 DIAGNOSIS — F0280 Dementia in other diseases classified elsewhere without behavioral disturbance: Secondary | ICD-10-CM | POA: Diagnosis not present

## 2016-05-01 DIAGNOSIS — I10 Essential (primary) hypertension: Secondary | ICD-10-CM

## 2016-05-01 DIAGNOSIS — E034 Atrophy of thyroid (acquired): Secondary | ICD-10-CM

## 2016-05-01 DIAGNOSIS — E038 Other specified hypothyroidism: Secondary | ICD-10-CM

## 2016-05-01 DIAGNOSIS — I5042 Chronic combined systolic (congestive) and diastolic (congestive) heart failure: Secondary | ICD-10-CM

## 2016-05-01 DIAGNOSIS — Z95810 Presence of automatic (implantable) cardiac defibrillator: Secondary | ICD-10-CM

## 2016-05-01 NOTE — Progress Notes (Signed)
DATE:  May 01, 2016  Location:  [facility]  Nursing Home Room Number: 141-B Place of Service: [phone])   Extended Emergency Contact Information Primary Emergency Contact: Keener,Janet Address: [address]          [place], [place] [address] [place] of [place] Home Phone: [phone] Mobile Phone: [phone] Relation: Daughter Secondary Emergency Contact: Dionisio Paschal Address: [address]          [place], [place] [address] [place] of [place] Home Phone: [phone] Mobile Phone: [phone] Relation: Relative  Advanced Directive information  DNR  Chief Complaint  Patient presents with  . Medical Management of Chronic Issues    Routine Visit    HPI:  82 yo male long term resident seen today for f/u. He has no concerns. He is a poor historian due to dementia. Hx obtained from chart. No nursing issues.   Afib - rate controlled on amiodarone 200 mg daily; digoxin 0.125 mg daily; lopressor 25 mg twice daily. Takes ASA 81 mg daily. dig level is 0.9   Diastolic heart failure - s/p AICD. stable on lasix  20 mg daily with k+ 40 meq daily  Hypothyroidism - stable on synthroid 25 mcg daily. TSH  2.954  Alzheimer's disease -  Advanced. Not taking medications at this time  BPH - stable on flomax 0.4 mg daily   Anemia - stable on nu-iron daily. Hgb 12.2   Constipation - stable on miralax daily; senna 2 tabs daily   Anxiety - on  ativan  0.5 mg twice daily and every 8 hours as needed. abilify stopped.  Hypertension - BP controlled on lopressor 25 mg twice daily and norvasc 2.5 mg daily   FTT/weight loss - his weight last month was 128 pounds 6 ounces his current weight is 132 lbs. Takes  supplements per facility protocol    Past Medical History  Diagnosis Date  . UTI (lower urinary tract infection)   . Nonischemic cardiomyopathy (HCC)   . Insomnia   . Obesity   . AICD (automatic cardioverter/defibrillator) present   .  Hypertension   . Vitamin D deficiency   . Sleep apnea     no cpap  . Dementia   . Depressive disorder   . LBBB (left bundle branch block)     Past Surgical History  Procedure Laterality Date  . Total knee arthroplasty      left  . Back surgery    . Cardiac defibrillator placement      [facility] Jude  . Esophagogastroduodenoscopy  06/28/2012    Procedure: ESOPHAGOGASTRODUODENOSCOPY (EGD);  Surgeon: Petra Kuba, MD;  Location: [facility] ENDOSCOPY;  Service: Endoscopy;  Laterality: N/A;  . Hernia repair      Patient Care Team: Kirt Boys, DO as PCP - General (Internal Medicine) Sharee Holster, NP as Nurse Practitioner (Nurse Practitioner) [facility] (Skilled Nursing Facility)  Social History   Social History  . Marital Status: Married    Spouse Name: N/A  . Number of Children: N/A  . Years of Education: N/A   Occupational History  . Not on file.   Social History Main Topics  . Smoking status: Never Smoker   . Smokeless tobacco: Never Used  . Alcohol Use: No  . Drug Use: No  . Sexual Activity: Not on file   Other Topics Concern  . Not on file   Social History Narrative     reports that he  has never smoked. He has never used smokeless tobacco. He reports that he does not drink alcohol or use illicit drugs.  Immunization History  Administered Date(s) Administered  . Influenza Split 10/22/2011, 09/26/2012, 10/14/2013  . Influenza-Unspecified 10/06/2014, 09/16/2015  . PPD Test 10/20/2010  . Pneumococcal Conjugate-13 08/24/2009  . Pneumococcal-Unspecified 10/12/2014    Allergies  Allergen Reactions  . Morphine Other (See Comments)    Per MAR  . Wellbutrin [Bupropion] Other (See Comments)    Per MAR     Medications: Patient's Medications  New Prescriptions   No medications on file  Previous Medications   AMIODARONE (PACERONE) 200 MG TABLET    Take 1 tablet (200 mg total) by mouth daily. Start amiodarone 200mg  daily on 12/24/14   AMLODIPINE  (NORVASC) 2.5 MG TABLET    Take 2.5 mg by mouth daily.   ASPIRIN EC 81 MG TABLET    Take 1 tablet (81 mg total) by mouth daily.   CALCIUM CARBONATE (TUMS - DOSED IN MG ELEMENTAL CALCIUM) 500 MG CHEWABLE TABLET    Chew 1 tablet by mouth daily.   CARBOXYMETHYLCELLULOSE (REFRESH TEARS) 0.5 % SOLN    Place 1 drop into both eyes 3 (three) times daily.    DIGOXIN (LANOXIN) 0.125 MG TABLET    Take 1 tablet (0.125 mg total) by mouth daily.   DIPHENHYDRAMINE (BENADRYL) 25 MG TABLET    Take 25 mg by mouth as needed for itching.   FUROSEMIDE (LASIX) 20 MG TABLET    Take 20 mg by mouth daily.   GUAIFENESIN (ROBITUSSIN) 100 MG/5ML LIQUID    Take 10 mLs by mouth every 6 (six) hours as needed for cough.   IRON POLYSACCHARIDES (NIFEREX) 150 MG CAPSULE    Take 150 mg by mouth daily.   LEVOTHYROXINE (LEVOTHROID) 25 MCG TABLET    Take 1 tablet (25 mcg total) by mouth daily before breakfast.   LORAZEPAM (ATIVAN) 0.5 MG TABLET    Take one tablet by mouth every 8 hours as needed for anxiety   LORAZEPAM (ATIVAN) 0.5 MG TABLET    Take 0.5 mg by mouth 2 (two) times daily.   MAGNESIUM HYDROXIDE (MILK OF MAGNESIA) 400 MG/5ML SUSPENSION    Take 30 mLs by mouth daily as needed for mild constipation.   METOPROLOL TARTRATE (LOPRESSOR) 25 MG TABLET    Take 1 tablet (25 mg total) by mouth 2 (two) times daily.   MIRTAZAPINE (REMERON) 7.5 MG TABLET    Take 7.5 mg by mouth at bedtime.   POLYETHYLENE GLYCOL (MIRALAX / GLYCOLAX) PACKET    Take 17 g by mouth daily.   POTASSIUM CHLORIDE SA (K-DUR,KLOR-CON) 20 MEQ TABLET    Take 2 tablets (40 mEq total) by mouth daily.   SENNA (SENOKOT) 8.6 MG TABLET    Take 2 tablets by mouth daily.   TAMSULOSIN HCL (FLOMAX) 0.4 MG CAPS    Take 1 capsule (0.4 mg total) by mouth daily after supper.   VITAMIN D, CHOLECALCIFEROL, 400 UNITS TABS    Take 1 tablet by mouth daily.  Modified Medications   No medications on file  Discontinued Medications   DIPHENHYDRAMINE (BENADRYL) 25 MG TABLET    Take 1  tablet (25 mg total) by mouth 2 (two) times daily as needed for itching.    Review of Systems  Unable to perform ROS: Dementia    Filed Vitals:   05/01/16 1131  BP: 115/66  Pulse: 87  Temp: 97.8 F (36.6 C)  TempSrc: Oral  Resp:  18  Height: 5\' 6"  (1.676 m)  Weight: 132 lb 6.4 oz (60.056 kg)  SpO2: 96%   Body mass index is 21.38 kg/(m^2).  Physical Exam  Constitutional: He appears well-developed.  Frail appearing, lying in bed resting but easily awakened. NAD  HENT:  Mouth/Throat: Oropharynx is clear and moist.  Eyes: Pupils are equal, round, and reactive to light. No scleral icterus.  Neck: Neck supple. Carotid bruit is not present. No thyromegaly present.  Cardiovascular: Normal rate, regular rhythm and intact distal pulses.  Exam reveals no gallop and no friction rub.   Murmur (1/6 SEM) heard. no distal LE swelling. No calf TTP  Pulmonary/Chest: Effort normal and breath sounds normal. He has no wheezes. He has no rales. He exhibits no tenderness.  ICD palpable in left ACW  Abdominal: Soft. Bowel sounds are normal. He exhibits no distension, no abdominal bruit, no pulsatile midline mass and no mass. There is no tenderness. There is no rebound and no guarding.  Musculoskeletal: He exhibits edema.  Lymphadenopathy:    He has no cervical adenopathy.  Neurological: He is alert.  Skin: Skin is warm and dry. No rash noted.  B/l thick, yellowish toenails but no ingrown toenails  Psychiatric: He has a normal mood and affect. His behavior is normal.     Labs reviewed: Nursing Home on 04/04/2016  Component Date Value Ref Range Status  . HM Diabetic Eye Exam 05/30/2015 No Retinopathy  No Retinopathy Final   [place]  . HM Diabetic Foot Exam 02/16/2016 completed   Final   [facility] on 03/05/2016  Component Date Value Ref Range Status  . Hemoglobin 01/27/2016 12.2* 13.5 - 17.5 g/dL Final  . HCT 16/09/9603 37* 41 - 53 % Final  . Platelets 01/27/2016 209   150 - 399 K/L Final  . WBC 01/27/2016 4.3   Final  . Glucose 01/27/2016 78   Final  . BUN 01/27/2016 19  4 - 21 mg/dL Final  . Creatinine 54/08/8118 1.0  0.6 - 1.3 mg/dL Final  . Potassium 14/78/2956 3.8  3.4 - 5.3 mmol/L Final  . Sodium 01/27/2016 141  137 - 147 mmol/L Final  . Triglycerides 01/27/2016 54  40 - 160 mg/dL Final  . Cholesterol 21/30/8657 135  0 - 200 mg/dL Final  . HDL 84/69/6295 45  35 - 70 mg/dL Final  . LDL Cholesterol 01/27/2016 79   Final    No results found.   Assessment/Plan   ICD-9-CM ICD-10-CM   1. Alzheimer's dementia 331.0 G30.9    294.10 F02.80   2. Essential hypertension, benign 401.1 I10   3. Chronic atrial fibrillation (HCC) 427.31 I48.2   4. Chronic combined systolic and diastolic congestive heart failure (HCC) 428.42 I50.42    428.0    5. Hypothyroidism due to acquired atrophy of thyroid 244.8 E03.8    246.8 E03.4   6. Biventricular ICD (implantable cardioverter-defibrillator) in place V45.02 Z95.810   7. Failure to thrive in adult 783.7 R62.7     Continue nutritional supplements as ordered  Pt is medically stable on current tx plan. Continue current medications as ordered. PT/OT/ST as indicated. Will follow  Amiaya Mcneeley S. Ancil Linsey  [facility] [address] [place], [place] [address] [phone] Cell (Monday-Friday 8 AM - 5 PM) [phone] After 5 PM and follow prompts

## 2016-05-02 ENCOUNTER — Encounter: Payer: Self-pay | Admitting: Adult Health

## 2016-05-02 NOTE — Progress Notes (Signed)
Patient ID: Kevin [name], male   DOB: 01-27-1925, 82 y.o.   MRN: [id number]    [facility]     CODE STATUS: DNR  Allergies  Allergen Reactions  . Morphine Other (See Comments)    Per MAR  . Wellbutrin [Bupropion] Other (See Comments)    Per [facility]     Chief Complaint  Patient presents with  . Medical Management of Chronic Issues    Follow Up    HPI:   Past Medical History  Diagnosis Date  . UTI (lower urinary tract infection)   . Nonischemic cardiomyopathy (HCC)   . Insomnia   . Obesity   . AICD (automatic cardioverter/defibrillator) present   . Hypertension   . Vitamin D deficiency   . Sleep apnea     no cpap  . Dementia   . Depressive disorder   . LBBB (left bundle branch block)     Past Surgical History  Procedure Laterality Date  . Total knee arthroplasty      left  . Back surgery    . Cardiac defibrillator placement      [facility] Jude  . Esophagogastroduodenoscopy  06/28/2012    Procedure: ESOPHAGOGASTRODUODENOSCOPY (EGD);  Surgeon: Petra Kuba, MD;  Location: [facility] ENDOSCOPY;  Service: Endoscopy;  Laterality: N/A;  . Hernia repair      Social History   Social History  . Marital Status: Married    Spouse Name: N/A  . Number of Children: N/A  . Years of Education: N/A   Occupational History  . Not on file.   Social History Main Topics  . Smoking status: Never Smoker   . Smokeless tobacco: Never Used  . Alcohol Use: No  . Drug Use: No  . Sexual Activity: Not on file   Other Topics Concern  . Not on file   Social History Narrative   Family History  Problem Relation Age of Onset  . Stroke Mother   . Lung cancer Brother   . Brain cancer Brother       VITAL SIGNS BP 120/76 mmHg  Pulse 76  Temp(Src) 97.8 F (36.6 C) (Oral)  Resp 18  Ht 5\' 6"  (1.676 m)  Wt 132 lb 4 oz (59.988 kg)  BMI 21.36 kg/m2  SpO2 96%  Patient's Medications  New Prescriptions   No medications on file  Previous Medications   AMIODARONE (PACERONE) 200 MG  TABLET    Take 1 tablet (200 mg total) by mouth daily. Start amiodarone 200mg  daily on 12/24/14   AMLODIPINE (NORVASC) 2.5 MG TABLET    Take 2.5 mg by mouth daily.   ASPIRIN EC 81 MG TABLET    Take 1 tablet (81 mg total) by mouth daily.   CALCIUM CARBONATE (TUMS - DOSED IN MG ELEMENTAL CALCIUM) 500 MG CHEWABLE TABLET    Chew 1 tablet by mouth daily.   CARBOXYMETHYLCELLULOSE (REFRESH TEARS) 0.5 % SOLN    Place 1 drop into both eyes 3 (three) times daily.    DIGOXIN (LANOXIN) 0.125 MG TABLET    Take 1 tablet (0.125 mg total) by mouth daily.   DIPHENHYDRAMINE (BENADRYL) 25 MG TABLET    Take 25 mg by mouth as needed for itching.   FUROSEMIDE (LASIX) 20 MG TABLET    Take 20 mg by mouth daily.   GUAIFENESIN (ROBITUSSIN) 100 MG/5ML LIQUID    Take 10 mLs by mouth every 6 (six) hours as needed for cough.   IRON POLYSACCHARIDES (NIFEREX) 150 MG CAPSULE  Take 150 mg by mouth daily.   LEVOTHYROXINE (LEVOTHROID) 25 MCG TABLET    Take 1 tablet (25 mcg total) by mouth daily before breakfast.   LORAZEPAM (ATIVAN) 0.5 MG TABLET    Take one tablet by mouth every 8 hours as needed for anxiety   LORAZEPAM (ATIVAN) 0.5 MG TABLET    Take 0.5 mg by mouth 2 (two) times daily.   MAGNESIUM HYDROXIDE (MILK OF MAGNESIA) 400 MG/5ML SUSPENSION    Take 30 mLs by mouth daily as needed for mild constipation.   METOPROLOL TARTRATE (LOPRESSOR) 25 MG TABLET    Take 1 tablet (25 mg total) by mouth 2 (two) times daily.   MIRTAZAPINE (REMERON) 7.5 MG TABLET    Take 7.5 mg by mouth at bedtime.   POLYETHYLENE GLYCOL (MIRALAX / GLYCOLAX) PACKET    Take 17 g by mouth daily.   POTASSIUM CHLORIDE SA (K-DUR,KLOR-CON) 20 MEQ TABLET    Take 2 tablets (40 mEq total) by mouth daily.   SACCHAROMYCES BOULARDII (FLORASTOR) 250 MG CAPSULE    Take 250 mg by mouth 2 (two) times daily.   SENNA (SENOKOT) 8.6 MG TABLET    Take 2 tablets by mouth daily.   TAMSULOSIN HCL (FLOMAX) 0.4 MG CAPS    Take 1 capsule (0.4 mg total) by mouth daily after supper.    VITAMIN D, CHOLECALCIFEROL, 400 UNITS TABS    Take 1 tablet by mouth daily.  Modified Medications   No medications on file  Discontinued Medications   No medications on file     SIGNIFICANT DIAGNOSTIC EXAMS  LABS REVIEWED: WILL DECLINE LABS    04-06-15: wbc 4.1; hgb 10.4; hct 33.4; mcv 81.3; plt 310; glucose 80; bun 22; creat 1.10; k+4.2; na++139; liver normal albumin 3.0; tsh 4.405; dig 0.7   01-27-16: wbc 4.3; hgb 12.2; hct 36.8; mcv 87.6; plt 209; glucose 78; bun 19; creat 1.05; k+ 3.8; na++141; liver normal albumin 3.0; chol 135; ldl 79; trig 54; hdl 45; tsh 2.954; dig 0.9     Review of Systems Unable to perform ROS: Dementia      Physical Exam Constitutional: No distress.  Eyes: Conjunctivae are normal.  Neck: Neck supple. No JVD present. No thyromegaly present.  Cardiovascular: Normal rate and intact distal pulses.   Heart rate slightly irregular   Respiratory: Effort normal and breath sounds normal. No respiratory distress. He has no wheezes.  GI: Soft. Bowel sounds are normal. He exhibits no distension. There is no tenderness.  Musculoskeletal: He exhibits no edema.  Able to move all extremities   Lymphadenopathy:    He has no cervical adenopathy.  Neurological: He is alert.  Skin: Skin is warm and dry. He is not diaphoretic.  Psychiatric: He has a normal mood and affect.      ASSESSMENT/ PLAN:  1. Afib: his heart rate is stable; will continue amiodarone 200 mg daily; digoxin 0.125 mg daily lopressor 25 mg twice daily for rate control asa 81 mg daily will monitor dig level is 0.9  Has biventricular ICD in place   2. Diastolic heart failure: is stable will continue  lasix  20 mg daily with k+ 40 meq daily will monitor his status. He is status post icd placement   3. Hypothyroidism: will continue synthroid 25 mcg daily  tsh is 2.954  4. Alzheimer's disease: his disease state is advanced he is not taking medications at this time; will not make changes will monitor  his status.   5. Bph: will  continue flomax 0.4 mg daily   6. Anemia: will continue nu-iron daily hgb is 12.2   7. Constipation: will continue miralax daily; senna 2 tabs daily   8. Anxiety: will continue  ativan  0.5 mg twice daily and every 8 hours as needed his abilify was stopped; will not make changes will monitor  9. Hypertension: will continue lopressor 25 mg twice daily and norvasc 2.5 mg daily  will monitor   10. FTT/weight loss: his weight in March 2017 was 132 pounds 6 ounces his current weight is 128 pounds 6 ounces. Will continue supplements per facility protocol and will monitor his status.              [name] NP [facility]  Contact [phone] Monday through Friday 8am- 5pm  After hours call [phone]    This encounter was created in error - please disregard.

## 2016-05-18 LAB — HM DIABETES FOOT EXAM

## 2016-05-31 ENCOUNTER — Encounter: Payer: Self-pay | Admitting: Adult Health

## 2016-05-31 ENCOUNTER — Non-Acute Institutional Stay (SKILLED_NURSING_FACILITY): Payer: Medicare Other | Admitting: Adult Health

## 2016-05-31 DIAGNOSIS — F0280 Dementia in other diseases classified elsewhere without behavioral disturbance: Secondary | ICD-10-CM | POA: Diagnosis not present

## 2016-05-31 DIAGNOSIS — G309 Alzheimer's disease, unspecified: Secondary | ICD-10-CM | POA: Diagnosis not present

## 2016-05-31 DIAGNOSIS — E038 Other specified hypothyroidism: Secondary | ICD-10-CM

## 2016-05-31 DIAGNOSIS — I5042 Chronic combined systolic (congestive) and diastolic (congestive) heart failure: Secondary | ICD-10-CM | POA: Diagnosis not present

## 2016-05-31 DIAGNOSIS — I110 Hypertensive heart disease with heart failure: Secondary | ICD-10-CM

## 2016-05-31 DIAGNOSIS — Z95810 Presence of automatic (implantable) cardiac defibrillator: Secondary | ICD-10-CM | POA: Diagnosis not present

## 2016-05-31 DIAGNOSIS — N400 Enlarged prostate without lower urinary tract symptoms: Secondary | ICD-10-CM

## 2016-05-31 DIAGNOSIS — I509 Heart failure, unspecified: Secondary | ICD-10-CM

## 2016-05-31 DIAGNOSIS — N401 Enlarged prostate with lower urinary tract symptoms: Secondary | ICD-10-CM

## 2016-05-31 DIAGNOSIS — R338 Other retention of urine: Secondary | ICD-10-CM

## 2016-05-31 DIAGNOSIS — E034 Atrophy of thyroid (acquired): Secondary | ICD-10-CM | POA: Diagnosis not present

## 2016-05-31 NOTE — Progress Notes (Signed)
Location:  [facility] Room Number: 141 B Place of Service:  SNF (31)   CODE STATUS: DNR  Allergies  Allergen Reactions  . Morphine Other (See Comments)    Per MAR  . Wellbutrin [Bupropion] Other (See Comments)    Per [facility]     Chief Complaint  Patient presents with  . Medical Management of Chronic Issues    HPI:  He is a long term resident of this facility being seen for the management of his chronic illnesses. Overall there is little change in his status. He is unable to participate in the hpi or ros. There are no nursing concerns at this time.  His weight is stable at 136 pounds. There are no nursing concerns at this time.    Past Medical History  Diagnosis Date  . UTI (lower urinary tract infection)   . Nonischemic cardiomyopathy (HCC)   . Insomnia   . Obesity   . AICD (automatic cardioverter/defibrillator) present   . Hypertension   . Vitamin D deficiency   . Sleep apnea     no cpap  . Dementia   . Depressive disorder   . LBBB (left bundle branch block)     Past Surgical History  Procedure Laterality Date  . Total knee arthroplasty      left  . Back surgery    . Cardiac defibrillator placement      ST Jude  . Esophagogastroduodenoscopy  06/28/2012    Procedure: ESOPHAGOGASTRODUODENOSCOPY (EGD);  Surgeon: Petra Kuba, MD;  Location: [facility] ENDOSCOPY;  Service: Endoscopy;  Laterality: N/A;  . Hernia repair      Social History   Social History  . Marital Status: Married    Spouse Name: N/A  . Number of Children: N/A  . Years of Education: N/A   Occupational History  . Not on file.   Social History Main Topics  . Smoking status: Never Smoker   . Smokeless tobacco: Never Used  . Alcohol Use: No  . Drug Use: No  . Sexual Activity: Not on file   Other Topics Concern  . Not on file   Social History Narrative   Family History  Problem Relation Age of Onset  . Stroke Mother   . Lung cancer Brother   . Brain cancer  Brother       VITAL SIGNS BP 100/60 mmHg  Pulse 67  Temp(Src) 97.2 F (36.2 C)  Resp 18  Ht 5\' 6"  (1.676 m)  Wt 136 lb (61.689 kg)  BMI 21.96 kg/m2  SpO2 95%  Patient's Medications  New Prescriptions   No medications on file  Previous Medications   AMIODARONE (PACERONE) 200 MG TABLET    Take 1 tablet (200 mg total) by mouth daily. Start amiodarone 200mg  daily on 12/24/14   AMLODIPINE (NORVASC) 2.5 MG TABLET    Take 2.5 mg by mouth daily.   ASPIRIN EC 81 MG TABLET    Take 1 tablet (81 mg total) by mouth daily.   CALCIUM CARBONATE (TUMS - DOSED IN MG ELEMENTAL CALCIUM) 500 MG CHEWABLE TABLET    Chew 1 tablet by mouth daily.   CARBOXYMETHYLCELLULOSE (REFRESH TEARS) 0.5 % SOLN    Place 1 drop into both eyes 3 (three) times daily.    DIGOXIN (LANOXIN) 0.125 MG TABLET    Take 1 tablet (0.125 mg total) by mouth daily.   DIPHENHYDRAMINE (BENADRYL) 25 MG TABLET    Take 25 mg by mouth 2 (two) times  daily as needed for itching.    FUROSEMIDE (LASIX) 20 MG TABLET    Take 20 mg by mouth daily.   GUAIFENESIN (ROBITUSSIN) 100 MG/5ML LIQUID    Take 10 mLs by mouth every 6 (six) hours as needed for cough.   IRON POLYSACCHARIDES (NIFEREX) 150 MG CAPSULE    Take 150 mg by mouth daily.   LEVOTHYROXINE (LEVOTHROID) 25 MCG TABLET    Take 1 tablet (25 mcg total) by mouth daily before breakfast.   LORAZEPAM (ATIVAN) 0.5 MG TABLET    Take one tablet by mouth every 8 hours as needed for anxiety   LORAZEPAM (ATIVAN) 0.5 MG TABLET    Take 0.5 mg by mouth 2 (two) times daily.   MAGNESIUM HYDROXIDE (MILK OF MAGNESIA) 400 MG/5ML SUSPENSION    Take 30 mLs by mouth daily as needed for mild constipation.   METOPROLOL TARTRATE (LOPRESSOR) 25 MG TABLET    Take 1 tablet (25 mg total) by mouth 2 (two) times daily.   MIRTAZAPINE (REMERON) 7.5 MG TABLET    Take 7.5 mg by mouth at bedtime.   POLYETHYLENE GLYCOL (MIRALAX / GLYCOLAX) PACKET    Take 17 g by mouth daily.   POTASSIUM CHLORIDE SA (K-DUR,KLOR-CON) 20 MEQ TABLET     Take 2 tablets (40 mEq total) by mouth daily.   SENNA (SENOKOT) 8.6 MG TABLET    Take 2 tablets by mouth daily.   TAMSULOSIN HCL (FLOMAX) 0.4 MG CAPS    Take 1 capsule (0.4 mg total) by mouth daily after supper.   VITAMIN D, CHOLECALCIFEROL, 400 UNITS TABS    Take 1 tablet by mouth daily.  Modified Medications   No medications on file  Discontinued Medications   No medications on file     SIGNIFICANT DIAGNOSTIC EXAMS    LABS REVIEWED: WILL DECLINE LABS    01-27-16: wbc 4.3; hgb 12.2; hct 36.8; mcv 87.6; plt 209; glucose 78; bun 19; creat 1.05; k+ 3.8; na++141; liver normal albumin 3.0; chol 135; ldl 79; trig 54; hdl 45; tsh 2.954; dig 0.9     Review of Systems Unable to perform ROS: Dementia      Physical Exam Constitutional: No distress.  Eyes: Conjunctivae are normal.  Neck: Neck supple. No JVD present. No thyromegaly present.  Cardiovascular: Normal rate and intact distal pulses.   Heart rate slightly irregular   Respiratory: Effort normal and breath sounds normal. No respiratory distress. He has no wheezes.  GI: Soft. Bowel sounds are normal. He exhibits no distension. There is no tenderness.  Musculoskeletal: He exhibits no edema.  Able to move all extremities   Lymphadenopathy:    He has no cervical adenopathy.  Neurological: He is alert.  Skin: Skin is warm and dry. He is not diaphoretic.  Psychiatric: He has a normal mood and affect.      ASSESSMENT/ PLAN:  1. Afib: his heart rate is stable; will continue amiodarone 200 mg daily; digoxin 0.125 mg daily lopressor 25 mg twice daily for rate control asa 81 mg daily will monitor dig level is 0.9  Has biventricular ICD in place   2. Diastolic heart failure: is stable will continue  lasix  20 mg daily with k+ 40 meq daily will monitor his status. He is status post icd placement   3. Hypothyroidism: will continue synthroid 25 mcg daily  tsh is 2.954  4. Alzheimer's disease: his disease state is advanced he is  not taking medications at this time; will not make changes  will monitor his status.   5. Bph: will continue flomax 0.4 mg daily   6. Anemia: will continue nu-iron daily hgb is 12.2   7. Constipation: will continue miralax daily; senna 2 tabs daily   8. Anxiety: will continue  ativan  0.5 mg twice daily and every 8 hours as needed his abilify was stopped; will not make changes will monitor  9. Hypertension: will continue lopressor 25 mg twice daily and norvasc 2.5 mg daily  will monitor   10. FTT/weight loss: his weight in March 2017 was 132 pounds 6 ounces his current weight is 135 pounds   Will continue supplements per facility protocol and will monitor his status.         [name] NP [facility]  Contact [phone] Monday through Friday 8am- 5pm  After hours call [phone]

## 2016-06-06 LAB — BASIC METABOLIC PANEL
BUN: 17 mg/dL (ref 4–21)
Creatinine: 1.1 mg/dL (ref 0.6–1.3)
Glucose: 79 mg/dL
Potassium: 4.4 mmol/L (ref 3.4–5.3)
SODIUM: 141 mmol/L (ref 137–147)

## 2016-06-06 LAB — HEPATIC FUNCTION PANEL
ALK PHOS: 90 U/L (ref 25–125)
ALT: 6 U/L — AB (ref 10–40)
AST: 5 U/L — AB (ref 14–40)
Bilirubin, Total: 0.4 mg/dL

## 2016-07-02 ENCOUNTER — Encounter: Payer: Self-pay | Admitting: Adult Health

## 2016-07-02 ENCOUNTER — Non-Acute Institutional Stay (SKILLED_NURSING_FACILITY): Payer: Medicare Other | Admitting: Adult Health

## 2016-07-02 DIAGNOSIS — F0280 Dementia in other diseases classified elsewhere without behavioral disturbance: Secondary | ICD-10-CM | POA: Diagnosis not present

## 2016-07-02 DIAGNOSIS — I482 Chronic atrial fibrillation, unspecified: Secondary | ICD-10-CM

## 2016-07-02 DIAGNOSIS — I110 Hypertensive heart disease with heart failure: Secondary | ICD-10-CM

## 2016-07-02 DIAGNOSIS — E034 Atrophy of thyroid (acquired): Secondary | ICD-10-CM

## 2016-07-02 DIAGNOSIS — R338 Other retention of urine: Secondary | ICD-10-CM

## 2016-07-02 DIAGNOSIS — Z95810 Presence of automatic (implantable) cardiac defibrillator: Secondary | ICD-10-CM

## 2016-07-02 DIAGNOSIS — I5042 Chronic combined systolic (congestive) and diastolic (congestive) heart failure: Secondary | ICD-10-CM | POA: Diagnosis not present

## 2016-07-02 DIAGNOSIS — I509 Heart failure, unspecified: Secondary | ICD-10-CM | POA: Diagnosis not present

## 2016-07-02 DIAGNOSIS — E038 Other specified hypothyroidism: Secondary | ICD-10-CM

## 2016-07-02 DIAGNOSIS — N400 Enlarged prostate without lower urinary tract symptoms: Secondary | ICD-10-CM | POA: Diagnosis not present

## 2016-07-02 DIAGNOSIS — G309 Alzheimer's disease, unspecified: Secondary | ICD-10-CM

## 2016-07-02 DIAGNOSIS — N401 Enlarged prostate with lower urinary tract symptoms: Secondary | ICD-10-CM

## 2016-07-02 NOTE — Progress Notes (Signed)
Patient ID: Kevin [name], male   DOB: 1925/03/08, 82 y.o.   MRN: [id number]    Location:   [facility] Room Number: 141-B Place of Service:  SNF (31)   CODE STATUS: DNR  Allergies  Allergen Reactions  . Morphine Other (See Comments)    Per MAR  . Wellbutrin [Bupropion] Other (See Comments)    Per [facility]     Chief Complaint  Patient presents with  . Medical Management of Chronic Issues    Follow up    HPI:  He is a long term resident of this facility being seen for the management of his chronic illnesses. Overall there is little change in his status. He does spend all of his time in bed per his choice. His family does come frequently to visit. He is unable to participate in the hpi or ros. There are no nursing concerns at this time.    Past Medical History  Diagnosis Date  . UTI (lower urinary tract infection)   . Nonischemic cardiomyopathy (HCC)   . Insomnia   . Obesity   . AICD (automatic cardioverter/defibrillator) present   . Hypertension   . Vitamin D deficiency   . Sleep apnea     no cpap  . Dementia   . Depressive disorder   . LBBB (left bundle branch block)     Past Surgical History  Procedure Laterality Date  . Total knee arthroplasty      left  . Back surgery    . Cardiac defibrillator placement      [facility] Jude  . Esophagogastroduodenoscopy  06/28/2012    Procedure: ESOPHAGOGASTRODUODENOSCOPY (EGD);  Surgeon: Petra Kuba, MD;  Location: [facility] ENDOSCOPY;  Service: Endoscopy;  Laterality: N/A;  . Hernia repair      Social History   Social History  . Marital Status: Married    Spouse Name: N/A  . Number of Children: N/A  . Years of Education: N/A   Occupational History  . Not on file.   Social History Main Topics  . Smoking status: Never Smoker   . Smokeless tobacco: Never Used  . Alcohol Use: No  . Drug Use: No  . Sexual Activity: Not on file   Other Topics Concern  . Not on file   Social History Narrative   Family History    Problem Relation Age of Onset  . Stroke Mother   . Lung cancer Brother   . Brain cancer Brother       VITAL SIGNS BP 97/61 mmHg  Pulse 81  Temp(Src) 98 F (36.7 C) (Oral)  Resp 18  Ht  (1.676 m)  Wt 133 lb 8 oz (60.555 kg)  BMI 21.56 kg/m2  SpO2 98%  Patient's Medications  New Prescriptions   No medications on file  Previous Medications   ACETAMINOPHEN (TYLENOL) 325 MG TABLET    Take 650 mg by mouth every 4 (four) hours as needed.   AMIODARONE (PACERONE) 200 MG TABLET    Take 1 tablet (200 mg total) by mouth daily. Start amiodarone  daily on 12/24/14   AMLODIPINE (NORVASC) 2.5 MG TABLET    Take 2.5 mg by mouth daily.   ASPIRIN EC 81 MG TABLET    Take 1 tablet (81 mg total) by mouth daily.   CALCIUM CARBONATE (TUMS - DOSED IN MG ELEMENTAL CALCIUM) 500 MG CHEWABLE TABLET    Chew 1 tablet by mouth daily.   CARBOXYMETHYLCELLULOSE (REFRESH TEARS) 0.5 % SOLN  Place 1 drop into both eyes 3 (three) times daily.    DIGOXIN (LANOXIN) 0.125 MG TABLET    Take 1 tablet (0.125 mg total) by mouth daily.   DIPHENHYDRAMINE (BENADRYL) 25 MG TABLET    Take 25 mg by mouth as needed for itching.    FUROSEMIDE (LASIX) 20 MG TABLET    Take 20 mg by mouth daily.   GUAIFENESIN (ROBITUSSIN) 100 MG/5ML LIQUID    Take 10 mLs by mouth every 6 (six) hours as needed for cough.   IRON POLYSACCHARIDES (NIFEREX) 150 MG CAPSULE    Take 150 mg by mouth daily.   LEVOTHYROXINE (LEVOTHROID) 25 MCG TABLET    Take 1 tablet (25 mcg total) by mouth daily before breakfast.   LORAZEPAM (ATIVAN) 0.5 MG TABLET    Take one tablet by mouth every 8 hours as needed for anxiety   LORAZEPAM (ATIVAN) 0.5 MG TABLET    Take 0.5 mg by mouth 2 (two) times daily.   MAGNESIUM HYDROXIDE (MILK OF MAGNESIA) 400 MG/5ML SUSPENSION    Take 30 mLs by mouth daily as needed for mild constipation.   METOPROLOL TARTRATE (LOPRESSOR) 25 MG TABLET    Take 1 tablet (25 mg total) by mouth 2 (two) times daily.   MIRTAZAPINE (REMERON) 7.5 MG  TABLET    Take 7.5 mg by mouth at bedtime.   POLYETHYLENE GLYCOL (MIRALAX / GLYCOLAX) PACKET    Take 17 g by mouth daily.   POTASSIUM CHLORIDE SA (K-DUR,KLOR-CON) 20 MEQ TABLET    Take 2 tablets (40 mEq total) by mouth daily.   SENNA (SENOKOT) 8.6 MG TABLET    Take 2 tablets by mouth daily.   TAMSULOSIN HCL (FLOMAX) 0.4 MG CAPS    Take 1 capsule (0.4 mg total) by mouth daily after supper.   VITAMIN D, CHOLECALCIFEROL, 400 UNITS TABS    Take 1 tablet by mouth daily.  Modified Medications   No medications on file  Discontinued Medications   No medications on file     SIGNIFICANT DIAGNOSTIC EXAMS  LABS REVIEWED: WILL DECLINE LABS    01-27-16: wbc 4.3; hgb 12.2; hct 36.8; mcv 87.6; plt 209; glucose 78; bun 19; creat 1.05; k+ 3.8; na++141; liver normal albumin 3.0; chol 135; ldl 79; trig 54; hdl 45; tsh 2.954; dig 0.9  06-06-16: glucose 79; bun 17; creat 1.07; k+ 4.4; na++ 141; liver normal albumin 2.8     Review of Systems Unable to perform ROS: Dementia      Physical Exam Constitutional: No distress.  Eyes: Conjunctivae are normal.  Neck: Neck supple. No JVD present. No thyromegaly present.  Cardiovascular: Normal rate and intact distal pulses.   Heart rate slightly irregular   Respiratory: Effort normal and breath sounds normal. No respiratory distress. He has no wheezes.  GI: Soft. Bowel sounds are normal. He exhibits no distension. There is no tenderness.  Musculoskeletal: He exhibits no edema.  Able to move all extremities   Lymphadenopathy:    He has no cervical adenopathy.  Neurological: He is alert.  Skin: Skin is warm and dry. He is not diaphoretic.  Psychiatric: He has a normal mood and affect.      ASSESSMENT/ PLAN:  1. Afib: his heart rate is stable; will continue amiodarone 200 mg daily; digoxin 0.125 mg daily lopressor 25 mg twice daily for rate control asa 81 mg daily will monitor dig level is 0.9  Has biventricular ICD in place   2. Diastolic heart  failure: is stable  will continue  lasix  20 mg daily with k+ 40 meq daily will monitor his status. He is status post icd placement   3. Hypothyroidism: will continue synthroid 25 mcg daily  tsh is 2.954  4. Alzheimer's disease: his disease state is advanced he is not taking medications at this time; will not make changes will monitor his status.   5. Bph: will continue flomax 0.4 mg daily   6. Anemia: will continue nu-iron daily hgb is 12.2   7. Constipation: will continue miralax daily; senna 2 tabs daily   8. Anxiety: will continue  ativan  0.5 mg twice daily and every 8 hours as needed his abilify was stopped; will not make changes will monitor  9. Hypertension: will continue lopressor 25 mg twice daily and norvasc 2.5 mg daily  will monitor   10. FTT/weight loss: his weight in March 2017 was 132 pounds 6 ounces his current weight is 133.5  pounds   Will continue supplements per facility protocol and will monitor his status.      [name] NP [facility]  Contact [phone] Monday through Friday 8am- 5pm  After hours call [phone]

## 2016-07-03 ENCOUNTER — Encounter: Payer: Self-pay | Admitting: Internal Medicine

## 2016-07-03 ENCOUNTER — Non-Acute Institutional Stay (SKILLED_NURSING_FACILITY): Payer: Medicare Other | Admitting: Internal Medicine

## 2016-07-03 DIAGNOSIS — G309 Alzheimer's disease, unspecified: Secondary | ICD-10-CM | POA: Diagnosis not present

## 2016-07-03 DIAGNOSIS — R05 Cough: Secondary | ICD-10-CM | POA: Diagnosis not present

## 2016-07-03 DIAGNOSIS — I482 Chronic atrial fibrillation, unspecified: Secondary | ICD-10-CM

## 2016-07-03 DIAGNOSIS — J069 Acute upper respiratory infection, unspecified: Secondary | ICD-10-CM

## 2016-07-03 DIAGNOSIS — I5042 Chronic combined systolic (congestive) and diastolic (congestive) heart failure: Secondary | ICD-10-CM

## 2016-07-03 DIAGNOSIS — F0280 Dementia in other diseases classified elsewhere without behavioral disturbance: Secondary | ICD-10-CM | POA: Diagnosis not present

## 2016-07-03 DIAGNOSIS — R059 Cough, unspecified: Secondary | ICD-10-CM

## 2016-07-03 NOTE — Progress Notes (Signed)
Patient ID: Kevin [name], male   DOB: Feb 27, 1925, 82 y.o.   MRN: [id number]    DATE: 07/03/16  Location:  Nursing Home Location: [facility]  Nursing Home Room Number: 141 B Place of Service: SNF [phone])   Extended Emergency Contact Information Primary Emergency Contact: Palmer Address: [address]          [place], [place] [address] Darden Amber of [place] Home Phone: [phone] Mobile Phone: [phone] Relation: Daughter Secondary Emergency Contact: Dionisio Paschal Address: [address]          [place], [place] [address] Darden Amber of [place] Home Phone: [phone] Mobile Phone: [phone] Relation: Relative  Advanced Directive information Does patient have an advance directive?: Yes, Type of Advance Directive: Out of facility DNR (pink MOST or yellow form), Does patient want to make changes to advanced directive?: No - Patient declined  Chief Complaint  Patient presents with  . Acute Visit    Patient has thick yellow nasal drainage    HPI:  82 yo male long term resident seen today for nasal d/c x 1 day. He reports mostly nonproductive cough. No f/c. No other nursing issues. He is a poor historian due to dementia. Hx obtained from chart  Hx Afib - HR controlled on amiodarone 200 mg daily; digoxin 0.125 mg daily lopressor 25 mg twice daily; takes ASA 81 mg daily; Has biventricular ICD in place   chronic Diastolic heart failure - stable on lasix 20 mg daily with k+ 40 meq daily; s/p ICD   Alzheimer's disease - advanced; he does not takemedications at this time.   Hypertension - BP stable on lopressor 25 mg twice daily and norvasc 2.5 mg daily   Past Medical History  Diagnosis Date  . UTI (lower urinary tract infection)   . Nonischemic cardiomyopathy (HCC)   . Insomnia   . Obesity   . AICD (automatic cardioverter/defibrillator) present   . Hypertension   . Vitamin D deficiency   . Sleep apnea     no cpap  . Dementia   . Depressive  disorder   . LBBB (left bundle branch block)     Past Surgical History  Procedure Laterality Date  . Total knee arthroplasty      left  . Back surgery    . Cardiac defibrillator placement      [facility] Jude  . Esophagogastroduodenoscopy  06/28/2012    Procedure: ESOPHAGOGASTRODUODENOSCOPY (EGD);  Surgeon: Petra Kuba, MD;  Location: [facility] ENDOSCOPY;  Service: Endoscopy;  Laterality: N/A;  . Hernia repair      Patient Care Team: Kirt Boys, DO as PCP - General (Internal Medicine) Sharee Holster, NP as Nurse Practitioner (Nurse Practitioner) [facility] (Sk[facility] Facility)  Social History   Social History  . Marital Status: Married    Spouse Name: N/A  . Number of Children: N/A  . Years of Education: N/A   Occupational History  . Not on file.   Social History Main Topics  . Smoking status: Never Smoker   . Smokeless tobacco: Never Used  . Alcohol Use: No  . Drug Use: No  . Sexual Activity: Not on file   Other Topics Concern  . Not on file   Social History Narrative     reports that he has never smoked. He has never used smokeless tobacco. He reports that he does not drink alcohol or use illicit drugs.  Family History  Problem Relation Age of Onset  .  Stroke Mother   . Lung cancer Brother   . Brain cancer Brother    Family Status  Relation Status Death Age  . Mother Deceased   . Father Deceased   . Brother Deceased   . Brother Deceased   . Sister Deceased     Immunization History  Administered Date(s) Administered  . Influenza Split 10/22/2011, 09/26/2012, 10/14/2013  . Influenza-Unspecified 10/06/2014, 09/16/2015  . PPD Test 10/20/2010  . Pneumococcal Conjugate-13 08/24/2009  . Pneumococcal-Unspecified 10/12/2014    Allergies  Allergen Reactions  . Morphine Other (See Comments)    Per MAR  . Wellbutrin [Bupropion] Other (See Comments)    Per MAR     Medications: Patient's Medications  New Prescriptions   No medications on  file  Previous Medications   ACETAMINOPHEN (TYLENOL) 325 MG TABLET    Take 650 mg by mouth every 4 (four) hours as needed.   AMIODARONE (PACERONE) 200 MG TABLET    Take 1 tablet (200 mg total) by mouth daily. Start amiodarone 200mg  daily on 12/24/14   AMLODIPINE (NORVASC) 2.5 MG TABLET    Take 2.5 mg by mouth daily.   ASPIRIN EC 81 MG TABLET    Take 1 tablet (81 mg total) by mouth daily.   CALCIUM CARBONATE (TUMS - DOSED IN MG ELEMENTAL CALCIUM) 500 MG CHEWABLE TABLET    Chew 1 tablet by mouth daily.   CARBOXYMETHYLCELLULOSE (REFRESH TEARS) 0.5 % SOLN    Place 1 drop into both eyes 3 (three) times daily.    DIGOXIN (LANOXIN) 0.125 MG TABLET    Take 1 tablet (0.125 mg total) by mouth daily.   DIPHENHYDRAMINE (BENADRYL) 25 MG TABLET    Take 25 mg by mouth as needed for itching.    FUROSEMIDE (LASIX) 20 MG TABLET    Take 20 mg by mouth daily.   GUAIFENESIN (ROBITUSSIN) 100 MG/5ML LIQUID    Take 10 mLs by mouth every 6 (six) hours as needed for cough.   IRON POLYSACCHARIDES (NIFEREX) 150 MG CAPSULE    Take 150 mg by mouth daily.   LEVOTHYROXINE (LEVOTHROID) 25 MCG TABLET    Take 1 tablet (25 mcg total) by mouth daily before breakfast.   LORAZEPAM (ATIVAN) 0.5 MG TABLET    Take one tablet by mouth every 8 hours as needed for anxiety   LORAZEPAM (ATIVAN) 0.5 MG TABLET    Take 0.5 mg by mouth 2 (two) times daily.   MAGNESIUM HYDROXIDE (MILK OF MAGNESIA) 400 MG/5ML SUSPENSION    Take 30 mLs by mouth daily as needed for mild constipation.   METOPROLOL TARTRATE (LOPRESSOR) 25 MG TABLET    Take 1 tablet (25 mg total) by mouth 2 (two) times daily.   MIRTAZAPINE (REMERON) 7.5 MG TABLET    Take 7.5 mg by mouth at bedtime.   POLYETHYLENE GLYCOL (MIRALAX / GLYCOLAX) PACKET    Take 17 g by mouth daily.   POTASSIUM CHLORIDE SA (K-DUR,KLOR-CON) 20 MEQ TABLET    Take 2 tablets (40 mEq total) by mouth daily.   SENNA (SENOKOT) 8.6 MG TABLET    Take 2 tablets by mouth daily.   TAMSULOSIN HCL (FLOMAX) 0.4 MG CAPS     Take 1 capsule (0.4 mg total) by mouth daily after supper.   VITAMIN D, CHOLECALCIFEROL, 400 UNITS TABS    Take 1 tablet by mouth daily.  Modified Medications   No medications on file  Discontinued Medications   No medications on file    Review of Systems  Unable to perform ROS: Dementia    Filed Vitals:   07/03/16 1426  BP: 146/67  Pulse: 77  Temp: 98.1 F (36.7 C)  TempSrc: Oral  Resp: 18  Height:  (1.778 m)  Weight: 133 lb 12.8 oz (60.691 kg)  SpO2: 93%   Body mass index is 19.2 kg/(m^2).  Physical Exam  HENT:  Mouth/Throat: No oropharyngeal exudate.  TMs appear nml. R>L nare with profuse clear-yellow d/c with turbinate swelling. No obvious FB. No bleeding. Oropharynx with cobblestoning but no redness or exudate.  Eyes: Pupils are equal, round, and reactive to light. Right eye exhibits no discharge. Left eye exhibits no discharge. No scleral icterus.  Neck: Neck supple.  Cardiovascular: An irregularly irregular rhythm present.  Murmur heard.  Systolic murmur is present with a grade of 1/6  No LE edema b/l. No calf TTP  Pulmonary/Chest: Effort normal. No respiratory distress. He has no wheezes. He has no rales. He exhibits no tenderness.  Congested BS right base. No wheezing  Neurological: He is alert.  Skin: Skin is warm and dry. No rash noted.  Psychiatric: He has a normal mood and affect. His behavior is normal.     Labs reviewed: Nursing Home on 07/02/2016  Component Date Value Ref Range Status  . HM Diabetic Foot Exam 05/18/2016 completed   Final  . Glucose 06/06/2016 79   Final  . BUN 06/06/2016 17  4 - 21 mg/dL Final  . Creatinine 16/09/9603 1.1  0.6 - 1.3 mg/dL Final  . Potassium 54/08/8118 4.4  3.4 - 5.3 mmol/L Final  . Sodium 06/06/2016 141  137 - 147 mmol/L Final  . Alkaline Phosphatase 06/06/2016 90  25 - 125 U/L Final  . ALT 06/06/2016 6* 10 - 40 U/L Final  . AST 06/06/2016 5* 14 - 40 U/L Final  . Bilirubin, Total 06/06/2016 0.4   Final    Nursing Home on 04/04/2016  Component Date Value Ref Range Status  . HM Diabetic Eye Exam 05/30/2015 No Retinopathy  No Retinopathy Final   [place]  . HM Diabetic Foot Exam 02/16/2016 completed   Final   [place]    No results found.   Assessment/Plan   ICD-9-CM ICD-10-CM   1. Cough 786.2 R05   2. Acute upper respiratory infection 465.9 J06.9   3. Chronic combined systolic and diastolic congestive heart failure (HCC) 428.42 I50.42    428.0    4. Chronic atrial fibrillation (HCC) 427.31 I48.2   5. Alzheimer's dementia 331.0 G30.9    294.10 F02.80     Check CBC w diff  CXR stat  Rocephin 1 gm IM x 1 today  D/c guafenesin liquid  Start mucinex  po q12hrs x 4 weeks  Cont other meds as ordered  Will follow  Knox Holdman S. Ancil Linsey  [facility] [address] [place], [place] [address] [phone] Cell (Monday-Friday 8 AM - 5 PM) [phone] After 5 PM and follow prompts

## 2016-07-04 LAB — CBC AND DIFFERENTIAL
HEMATOCRIT: 35 % — AB (ref 41–53)
HEMOGLOBIN: 11.7 g/dL — AB (ref 13.5–17.5)
PLATELETS: 308 10*3/uL (ref 150–399)
WBC: 4.2 10^3/mL

## 2016-08-06 ENCOUNTER — Encounter: Payer: Self-pay | Admitting: Adult Health

## 2016-08-06 ENCOUNTER — Non-Acute Institutional Stay (SKILLED_NURSING_FACILITY): Payer: Medicare Other | Admitting: Adult Health

## 2016-08-06 DIAGNOSIS — I509 Heart failure, unspecified: Secondary | ICD-10-CM | POA: Diagnosis not present

## 2016-08-06 DIAGNOSIS — R634 Abnormal weight loss: Secondary | ICD-10-CM | POA: Insufficient documentation

## 2016-08-06 DIAGNOSIS — Z95810 Presence of automatic (implantable) cardiac defibrillator: Secondary | ICD-10-CM | POA: Diagnosis not present

## 2016-08-06 DIAGNOSIS — I110 Hypertensive heart disease with heart failure: Secondary | ICD-10-CM

## 2016-08-06 DIAGNOSIS — I482 Chronic atrial fibrillation, unspecified: Secondary | ICD-10-CM

## 2016-08-06 DIAGNOSIS — R338 Other retention of urine: Secondary | ICD-10-CM

## 2016-08-06 DIAGNOSIS — R627 Adult failure to thrive: Secondary | ICD-10-CM | POA: Diagnosis not present

## 2016-08-06 DIAGNOSIS — F0280 Dementia in other diseases classified elsewhere without behavioral disturbance: Secondary | ICD-10-CM | POA: Diagnosis not present

## 2016-08-06 DIAGNOSIS — E038 Other specified hypothyroidism: Secondary | ICD-10-CM

## 2016-08-06 DIAGNOSIS — N401 Enlarged prostate with lower urinary tract symptoms: Secondary | ICD-10-CM

## 2016-08-06 DIAGNOSIS — N400 Enlarged prostate without lower urinary tract symptoms: Secondary | ICD-10-CM

## 2016-08-06 DIAGNOSIS — I5042 Chronic combined systolic (congestive) and diastolic (congestive) heart failure: Secondary | ICD-10-CM | POA: Diagnosis not present

## 2016-08-06 DIAGNOSIS — E034 Atrophy of thyroid (acquired): Secondary | ICD-10-CM

## 2016-08-06 DIAGNOSIS — G309 Alzheimer's disease, unspecified: Secondary | ICD-10-CM

## 2016-08-06 NOTE — Progress Notes (Signed)
Patient ID: Kevin [name], male   DOB: December 17, 1925, 82 y.o.   MRN: [id number]   Location:   [facility] Room Number: 141-B Place of Service:  SNF (31)   CODE STATUS: DNR  Allergies  Allergen Reactions  . Morphine Other (See Comments)    Per MAR  . Wellbutrin [Bupropion] Other (See Comments)    [facility]     Chief Complaint  Patient presents with  . Medical Management of Chronic Issues    Follow up    HPI:  He is a long term resident of this facility being seen for the management of his chronic illnesses. Overall there is little change in his status. His weight is stable at 132 pounds.  He does spend all of his time in his bed per his choice. He is unable to participate in the hpi or ros. There are no nursing concerns at this time.    Past Medical History:  Diagnosis Date  . AICD (automatic cardioverter/defibrillator) present   . Dementia   . Depressive disorder   . Hypertension   . Insomnia   . LBBB (left bundle branch block)   . Nonischemic cardiomyopathy (HCC)   . Obesity   . Sleep apnea    no cpap  . UTI (lower urinary tract infection)   . Vitamin D deficiency     Past Surgical History:  Procedure Laterality Date  . BACK SURGERY    . CARDIAC DEFIBRILLATOR PLACEMENT     [facility]  . ESOPHAGOGASTRODUODENOSCOPY  06/28/2012   Procedure: ESOPHAGOGASTRODUODENOSCOPY (EGD);  Surgeon: Petra KubaMarc E Magod, MD;  [facility];  Service: Endoscopy;  Laterality: N/A;  . HERNIA REPAIR    . TOTAL KNEE ARTHROPLASTY     left    Social History   Social History  . Marital status: Married    Spouse name: N/A  . Number of children: N/A  . Years of education: N/A   Occupational History  . Not on file.   Social History Main Topics  . Smoking status: Never Smoker  . Smokeless tobacco: Never Used  . Alcohol use No  . Drug use: No  . Sexual activity: Not on file   Other Topics Concern  . Not on file   Social History Narrative  . No narrative on file    Family History  Problem Relation Age of Onset  . Stroke Mother   . Lung cancer Brother   . Brain cancer Brother       VITAL SIGNS BP 137/60   Pulse 82   Temp 97.8 F (36.6 C) (Oral)   Resp 18   Ht 5\' 6"  (1.676 m)   Wt 132 lb 8 oz (60.1 kg)   SpO2 96%   BMI 21.39 kg/m   Patient's Medications  New Prescriptions   No medications on file  Previous Medications   ACETAMINOPHEN (TYLENOL) 325 MG TABLET    Take 650 mg by mouth every 4 (four) hours as needed.   AMIODARONE (PACERONE) 200 MG TABLET    Take 1 tablet (200 mg total) by mouth daily. Start amiodarone 200mg  daily on 12/24/14   AMLODIPINE (NORVASC) 2.5 MG TABLET    Take 2.5 mg by mouth daily.   ASPIRIN EC 81 MG TABLET    Take 1 tablet (81 mg total) by mouth daily.   CALCIUM CARBONATE (TUMS - DOSED IN MG ELEMENTAL CALCIUM) 500 MG CHEWABLE TABLET    Chew 1 tablet by mouth daily.   CARBOXYMETHYLCELLULOSE (  REFRESH TEARS) 0.5 % SOLN    Place 1 drop into both eyes 3 (three) times daily.    CETIRIZINE HCL (ZYRTEC PO)    Take 5 mg by mouth daily.   DIGOXIN (LANOXIN) 0.125 MG TABLET    Take 1 tablet (0.125 mg total) by mouth daily.   DIPHENHYDRAMINE (BENADRYL) 25 MG TABLET    Take 25 mg by mouth as needed for itching.    IRON POLYSACCHARIDES (NIFEREX) 150 MG CAPSULE    Take 150 mg by mouth daily.   LEVOTHYROXINE (LEVOTHROID) 25 MCG TABLET    Take 1 tablet (25 mcg total) by mouth daily before breakfast.   LORAZEPAM (ATIVAN) 0.5 MG TABLET    Take one tablet by mouth every 8 hours as needed for anxiety   LORAZEPAM (ATIVAN) 0.5 MG TABLET    Take 0.5 mg by mouth 2 (two) times daily.   MAGNESIUM HYDROXIDE (MILK OF MAGNESIA) 400 MG/5ML SUSPENSION    Take 30 mLs by mouth daily as needed for mild constipation.   METOPROLOL TARTRATE (LOPRESSOR) 25 MG TABLET    Take 1 tablet (25 mg total) by mouth 2 (two) times daily.   MIRTAZAPINE (REMERON) 7.5 MG TABLET    Take 7.5 mg by mouth at bedtime.   POLYETHYLENE GLYCOL (MIRALAX / GLYCOLAX) PACKET     Take 17 g by mouth daily.   POTASSIUM CHLORIDE SA (K-DUR,KLOR-CON) 20 MEQ TABLET    Take 2 tablets (40 mEq total) by mouth daily.   SENNA (SENOKOT) 8.6 MG TABLET    Take 2 tablets by mouth daily.   TAMSULOSIN HCL (FLOMAX) 0.4 MG CAPS    Take 1 capsule (0.4 mg total) by mouth daily after supper.   TORSEMIDE (DEMADEX) 10 MG TABLET    Take 10 mg by mouth daily.   VITAMIN D, CHOLECALCIFEROL, 400 UNITS TABS    Take 1 tablet by mouth daily.  Modified Medications   No medications on file  Discontinued Medications   FUROSEMIDE (LASIX) 20 MG TABLET    Take 20 mg by mouth daily.   GUAIFENESIN (ROBITUSSIN) 100 MG/5ML LIQUID    Take 10 mLs by mouth every 6 (six) hours as needed for cough.     SIGNIFICANT DIAGNOSTIC EXAMS  LABS REVIEWED: WILL DECLINE LABS    01-27-16: wbc 4.3; hgb 12.2; hct 36.8; mcv 87.6; plt 209; glucose 78; bun 19; creat 1.05; k+ 3.8; na++141; liver normal albumin 3.0; chol 135; ldl 79; trig 54; hdl 45; tsh 2.954; dig 0.9  06-06-16: glucose 79; bun 17; creat 1.07; k+ 4.4; na++ 141; liver normal albumin 2.8  07-04-16: wbc 4.2; hgb 11.7; hct 34.6; mcv 90.8; plt 308    Review of Systems Unable to perform ROS: Dementia      Physical Exam Constitutional: No distress. frail  Eyes: Conjunctivae are normal.  Neck: Neck supple. No JVD present. No thyromegaly present.  Cardiovascular: Normal rate and intact distal pulses.   Heart rate slightly irregular   Respiratory: Effort normal and breath sounds normal. No respiratory distress. He has no wheezes.  GI: Soft. Bowel sounds are normal. He exhibits no distension. There is no tenderness.  Musculoskeletal: He exhibits no edema.  Able to move all extremities   Lymphadenopathy:    He has no cervical adenopathy.  Neurological: He is alert.  Skin: Skin is warm and dry. He is not diaphoretic.  Psychiatric: He has a normal mood and affect.      ASSESSMENT/ PLAN:  1. Afib: his heart rate  is stable; will continue amiodarone 200 mg  daily; digoxin 0.125 mg daily lopressor 25 mg twice daily for rate control asa 81 mg daily will monitor dig level is 0.9  Has biventricular ICD in place   2. Diastolic heart failure: is stable will continue  demadex  10 mg daily with k+ 40 meq daily will monitor his status. He is status post icd placement   3. Hypothyroidism: will continue synthroid 25 mcg daily  tsh is 2.954  4. Alzheimer's disease: his disease state is advanced he is not taking medications at this time; will not make changes will monitor his status.   5. Bph: will continue flomax 0.4 mg daily   6. Anemia: will continue niferex daily hgb is 12.2   7. Constipation: will continue miralax daily; senna 2 tabs daily   8. Anxiety: will continue  ativan  0.5 mg twice daily and every 8 hours as needed  Is taking remeron 7.5 mg nightly to help with appetite  will not make changes will monitor  9. Hypertension: will continue lopressor 25 mg twice daily and norvasc 2.5 mg daily  will monitor   10. FTT/weight loss: his weight in March 2017 was 132 pounds 6 ounces his current weight is 132  pounds   Will continue supplements per facility protocol and will monitor his status.     MD is aware of resident's narcotic use and is in agreement with current plan of care. We will attempt to wean resident as apropriate   [name] NP [facility]  Contact [phone] Monday through Friday 8am- 5pm  After hours call [phone]

## 2016-08-17 LAB — BASIC METABOLIC PANEL
BUN: 13 mg/dL (ref 4–21)
CREATININE: 1.1 mg/dL (ref 0.6–1.3)
GLUCOSE: 73 mg/dL
POTASSIUM: 4.2 mmol/L (ref 3.4–5.3)
SODIUM: 141 mmol/L (ref 137–147)

## 2016-09-17 ENCOUNTER — Encounter: Payer: Self-pay | Admitting: Adult Health

## 2016-09-17 ENCOUNTER — Non-Acute Institutional Stay (SKILLED_NURSING_FACILITY): Payer: Medicare Other | Admitting: Adult Health

## 2016-09-17 DIAGNOSIS — I110 Hypertensive heart disease with heart failure: Secondary | ICD-10-CM | POA: Diagnosis not present

## 2016-09-17 DIAGNOSIS — I482 Chronic atrial fibrillation, unspecified: Secondary | ICD-10-CM

## 2016-09-17 DIAGNOSIS — Z95810 Presence of automatic (implantable) cardiac defibrillator: Secondary | ICD-10-CM | POA: Diagnosis not present

## 2016-09-17 DIAGNOSIS — N401 Enlarged prostate with lower urinary tract symptoms: Secondary | ICD-10-CM

## 2016-09-17 DIAGNOSIS — I509 Heart failure, unspecified: Secondary | ICD-10-CM

## 2016-09-17 DIAGNOSIS — F0280 Dementia in other diseases classified elsewhere without behavioral disturbance: Secondary | ICD-10-CM

## 2016-09-17 DIAGNOSIS — R627 Adult failure to thrive: Secondary | ICD-10-CM

## 2016-09-17 DIAGNOSIS — G309 Alzheimer's disease, unspecified: Secondary | ICD-10-CM

## 2016-09-17 DIAGNOSIS — N400 Enlarged prostate without lower urinary tract symptoms: Secondary | ICD-10-CM

## 2016-09-17 DIAGNOSIS — R634 Abnormal weight loss: Secondary | ICD-10-CM

## 2016-09-17 DIAGNOSIS — I5042 Chronic combined systolic (congestive) and diastolic (congestive) heart failure: Secondary | ICD-10-CM | POA: Diagnosis not present

## 2016-09-17 DIAGNOSIS — R338 Other retention of urine: Secondary | ICD-10-CM

## 2016-09-17 NOTE — Progress Notes (Signed)
Patient ID: Kevin [name], male   DOB: 10/06/1925, 82 y.o.   MRN: [id number]   Location:   [facility] Room Number: 141-B Place of Service:  SNF (31)   CODE STATUS: DNR  Allergies  Allergen Reactions  . Morphine Other (See Comments)    Per MAR  . Wellbutrin [Bupropion] Other (See Comments)    Per [facility]     Chief Complaint  Patient presents with  . Medical Management of Chronic Issues    Follow up    HPI:  He is a long term resident of this facility being seen for the management of his chronic illnesses. Overall his status is without significant changes. He is unable to participate in the hpi or ros; he does spend all his time in bed per his choice. There are no nursing concerns at this time.   Past Medical History:  Diagnosis Date  . AICD (automatic cardioverter/defibrillator) present   . Dementia   . Depressive disorder   . Hypertension   . Insomnia   . LBBB (left bundle branch block)   . Nonischemic cardiomyopathy (HCC)   . Obesity   . Sleep apnea    no cpap  . UTI (lower urinary tract infection)   . Vitamin D deficiency     Past Surgical History:  Procedure Laterality Date  . BACK SURGERY    . CARDIAC DEFIBRILLATOR PLACEMENT     [facility]  . ESOPHAGOGASTRODUODENOSCOPY  06/28/2012   Procedure: ESOPHAGOGASTRODUODENOSCOPY (EGD);  Surgeon: Petra Kuba, MD;  Location: [facility] ENDOSCOPY;  Service: Endoscopy;  Laterality: N/A;  . HERNIA REPAIR    . TOTAL KNEE ARTHROPLASTY     left    Social History   Social History  . Marital status: Married    Spouse name: N/A  . Number of children: N/A  . Years of education: N/A   Occupational History  . Not on file.   Social History Main Topics  . Smoking status: Never Smoker  . Smokeless tobacco: Never Used  . Alcohol use No  . Drug use: No  . Sexual activity: Not on file   Other Topics Concern  . Not on file   Social History Narrative  . No narrative on file   Family History  Problem Relation Age of  Onset  . Stroke Mother   . Lung cancer Brother   . Brain cancer Brother       VITAL SIGNS BP 108/66   Pulse 74   Temp 97.5 F (36.4 C) (Oral)   Resp 18   Ht 5\' 6"  (1.676 m)   Wt 132 lb (59.9 kg)   SpO2 95%   BMI 21.31 kg/m   Patient's Medications  New Prescriptions   No medications on file  Previous Medications   ACETAMINOPHEN (TYLENOL) 325 MG TABLET    Take 650 mg by mouth every 4 (four) hours as needed.   AMIODARONE (PACERONE) 200 MG TABLET    Take 1 tablet (200 mg total) by mouth daily. Start amiodarone 200mg  daily on 12/24/14   ASPIRIN EC 81 MG TABLET    Take 1 tablet (81 mg total) by mouth daily.   CALCIUM CARBONATE (TUMS - DOSED IN MG ELEMENTAL CALCIUM) 500 MG CHEWABLE TABLET    Chew 1 tablet by mouth daily.   CARBOXYMETHYLCELLULOSE (REFRESH TEARS) 0.5 % SOLN    Place 1 drop into both eyes 3 (three) times daily.    CETIRIZINE HCL (ZYRTEC PO)    Take  5 mg by mouth daily.   DIGOXIN (LANOXIN) 0.125 MG TABLET    Take 1 tablet (0.125 mg total) by mouth daily.   DIPHENHYDRAMINE (BENADRYL) 25 MG TABLET    Take 25 mg by mouth as needed for itching.    IRON POLYSACCHARIDES (NIFEREX) 150 MG CAPSULE    Take 150 mg by mouth daily.   LEVOTHYROXINE (LEVOTHROID) 25 MCG TABLET    Take 1 tablet (25 mcg total) by mouth daily before breakfast.   LORAZEPAM (ATIVAN) 0.5 MG TABLET    Take one tablet by mouth every 8 hours as needed for anxiety   LORAZEPAM (ATIVAN) 0.5 MG TABLET    Take 0.5 mg by mouth 2 (two) times daily.   MAGNESIUM HYDROXIDE (MILK OF MAGNESIA) 400 MG/5ML SUSPENSION    Take 30 mLs by mouth daily as needed for mild constipation.   METOPROLOL TARTRATE (LOPRESSOR) 25 MG TABLET    Take 1 tablet (25 mg total) by mouth 2 (two) times daily.   MIRTAZAPINE (REMERON) 7.5 MG TABLET    Take 7.5 mg by mouth at bedtime.   POLYETHYLENE GLYCOL (MIRALAX / GLYCOLAX) PACKET    Take 17 g by mouth daily.   POTASSIUM CHLORIDE SA (K-DUR,KLOR-CON) 20 MEQ TABLET    Take 2 tablets (40 mEq total) by  mouth daily.   SENNA (SENOKOT) 8.6 MG TABLET    Take 2 tablets by mouth daily.   TAMSULOSIN HCL (FLOMAX) 0.4 MG CAPS    Take 1 capsule (0.4 mg total) by mouth daily after supper.   TORSEMIDE (DEMADEX) 10 MG TABLET    Take 10 mg by mouth daily.   VITAMIN D, CHOLECALCIFEROL, 400 UNITS TABS    Take 1 tablet by mouth daily.  Modified Medications   No medications on file  Discontinued Medications   AMLODIPINE (NORVASC) 2.5 MG TABLET    Take 2.5 mg by mouth daily.     SIGNIFICANT DIAGNOSTIC EXAMS  LABS REVIEWED: WILL DECLINE LABS    01-27-16: wbc 4.3; hgb 12.2; hct 36.8; mcv 87.6; plt 209; glucose 78; bun 19; creat 1.05; k+ 3.8; na++141; liver normal albumin 3.0; chol 135; ldl 79; trig 54; hdl 45; tsh 2.954; dig 0.9  06-06-16: glucose 79; bun 17; creat 1.07; k+ 4.4; na++ 141; liver normal albumin 2.8  07-04-16: wbc 4.2; hgb 11.7; hct 34.6; mcv 90.8; plt 308 08-17-16: glucose 73; bun 13; creat 1.07; k+ 4.2; na++ 141     Review of Systems Unable to perform ROS: Dementia      Physical Exam Constitutional: No distress. frail  Eyes: Conjunctivae are normal.  Neck: Neck supple. No JVD present. No thyromegaly present.  Cardiovascular: Normal rate and intact distal pulses.   Heart rate slightly irregular   Respiratory: Effort normal and breath sounds normal. No respiratory distress. He has no wheezes.  GI: Soft. Bowel sounds are normal. He exhibits no distension. There is no tenderness.  Musculoskeletal: He exhibits no edema.  Able to move all extremities   Lymphadenopathy:    He has no cervical adenopathy.  Neurological: He is alert.  Skin: Skin is warm and dry. He is not diaphoretic.  Psychiatric: He has a normal mood and affect.      ASSESSMENT/ PLAN:  1. Afib: his heart rate is stable; will continue amiodarone 200 mg daily; digoxin 0.125 mg daily lopressor 25 mg twice daily for rate control asa 81 mg daily will monitor dig level is 0.9  Has biventricular ICD in place   2.  Diastolic heart failure: is stable will continue  demadex  10 mg daily with k+ 40 meq daily will monitor his status. He is status post icd placement   3. Hypothyroidism: will continue synthroid 25 mcg daily  tsh is 2.954  4. Alzheimer's disease: his disease state is advanced he is not taking medications at this time; will not make changes will monitor his status.   5. Bph: will continue flomax 0.4 mg daily   6. Anemia: will continue niferex daily hgb is 12.2   7. Constipation: will continue miralax daily; senna 2 tabs daily   8. Anxiety: will continue  ativan  0.5 mg twice daily and every 8 hours as needed  Is taking remeron 7.5 mg nightly to help with appetite  will not make changes will monitor  9. Hypertension: will continue lopressor 25 mg twice daily and norvasc 2.5 mg daily  will monitor   10. FTT/weight loss: his weight in March 2017 was 132 pounds 6 ounces his current weight is 132  pounds   Will continue supplements per facility protocol and will monitor his status.            MD is aware of resident's narcotic use and is in agreement with current plan of care. We will attempt to wean resident as apropriate   [name] NP [facility] Adult Medicine  Contact [phone] Monday through Friday 8am- 5pm  After hours call [phone]

## 2016-10-18 ENCOUNTER — Non-Acute Institutional Stay (SKILLED_NURSING_FACILITY): Payer: Medicare Other | Admitting: Adult Health

## 2016-10-18 ENCOUNTER — Encounter: Payer: Self-pay | Admitting: Adult Health

## 2016-10-18 DIAGNOSIS — I509 Heart failure, unspecified: Secondary | ICD-10-CM

## 2016-10-18 DIAGNOSIS — F0280 Dementia in other diseases classified elsewhere without behavioral disturbance: Secondary | ICD-10-CM | POA: Diagnosis not present

## 2016-10-18 DIAGNOSIS — R627 Adult failure to thrive: Secondary | ICD-10-CM

## 2016-10-18 DIAGNOSIS — I482 Chronic atrial fibrillation, unspecified: Secondary | ICD-10-CM

## 2016-10-18 DIAGNOSIS — R338 Other retention of urine: Secondary | ICD-10-CM

## 2016-10-18 DIAGNOSIS — Z95810 Presence of automatic (implantable) cardiac defibrillator: Secondary | ICD-10-CM | POA: Diagnosis not present

## 2016-10-18 DIAGNOSIS — I110 Hypertensive heart disease with heart failure: Secondary | ICD-10-CM

## 2016-10-18 DIAGNOSIS — N401 Enlarged prostate with lower urinary tract symptoms: Secondary | ICD-10-CM

## 2016-10-18 DIAGNOSIS — E034 Atrophy of thyroid (acquired): Secondary | ICD-10-CM

## 2016-10-18 DIAGNOSIS — I5042 Chronic combined systolic (congestive) and diastolic (congestive) heart failure: Secondary | ICD-10-CM | POA: Diagnosis not present

## 2016-10-18 DIAGNOSIS — G300 Alzheimer's disease with early onset: Secondary | ICD-10-CM | POA: Diagnosis not present

## 2016-10-18 NOTE — Progress Notes (Signed)
Patient ID: Kevin [name], male   DOB: 08/12/1925, 82 y.o.   MRN: [id number]   Location:   [facility] Room Number: 141-B Place of Service:  SNF (31)   CODE STATUS: DNR  Allergies  Allergen Reactions  . Morphine Other (See Comments)    Per MAR  . Wellbutrin [Bupropion] Other (See Comments)    Per [facility]     Chief Complaint  Patient presents with  . Medical Management of Chronic Issues    Follow up    HPI:  He is a long term resident of this facility being seen for the management of his chronic illnesses. Overall there is little change in his status. He does spend all of his time in bed per his choice. He is unable to fully participate in the hpi or ros; but did tell me that he felt "ok". There are no nursing concerns at this time.   Past Medical History:  Diagnosis Date  . AICD (automatic cardioverter/defibrillator) present   . Dementia   . Depressive disorder   . Hypertension   . Insomnia   . LBBB (left bundle branch block)   . Nonischemic cardiomyopathy (HCC)   . Obesity   . Sleep apnea    no cpap  . UTI (lower urinary tract infection)   . Vitamin D deficiency     Past Surgical History:  Procedure Laterality Date  . BACK SURGERY    . CARDIAC DEFIBRILLATOR PLACEMENT     [facility]  . ESOPHAGOGASTRODUODENOSCOPY  06/28/2012   Procedure: ESOPHAGOGASTRODUODENOSCOPY (EGD);  Surgeon: Petra Kuba, MD;  Location: [facility] ENDOSCOPY;  Service: Endoscopy;  Laterality: N/A;  . HERNIA REPAIR    . TOTAL KNEE ARTHROPLASTY     left    Social History   Social History  . Marital status: Married    Spouse name: N/A  . Number of children: N/A  . Years of education: N/A   Occupational History  . Not on file.   Social History Main Topics  . Smoking status: Never Smoker  . Smokeless tobacco: Never Used  . Alcohol use No  . Drug use: No  . Sexual activity: Not on file   Other Topics Concern  . Not on file   Social History Narrative  . No narrative on file    Family History  Problem Relation Age of Onset  . Stroke Mother   . Lung cancer Brother   . Brain cancer Brother       VITAL SIGNS BP 104/63   Pulse 74   Temp 97.3 F (36.3 C) (Oral)   Resp 18   Ht 5\' 4"  (1.626 m)   Wt 132 lb (59.9 kg)   SpO2 96%   BMI 22.66 kg/m   Patient's Medications  New Prescriptions   No medications on file  Previous Medications   ACETAMINOPHEN (TYLENOL) 325 MG TABLET    Take 650 mg by mouth every 4 (four) hours as needed.   AMIODARONE (PACERONE) 200 MG TABLET    Take 1 tablet (200 mg total) by mouth daily. Start amiodarone 200mg  daily on 12/24/14   ASPIRIN EC 81 MG TABLET    Take 1 tablet (81 mg total) by mouth daily.   CALCIUM CARBONATE (TUMS - DOSED IN MG ELEMENTAL CALCIUM) 500 MG CHEWABLE TABLET    Chew 1 tablet by mouth daily.   CARBOXYMETHYLCELLULOSE (REFRESH TEARS) 0.5 % SOLN    Place 1 drop into both eyes 3 (three) times daily.  CETIRIZINE HCL (ZYRTEC PO)    Take 5 mg by mouth daily.   DIGOXIN (LANOXIN) 0.125 MG TABLET    Take 1 tablet (0.125 mg total) by mouth daily.   DIPHENHYDRAMINE (BENADRYL) 25 MG TABLET    Take 25 mg by mouth as needed for itching.    FLUTICASONE (FLONASE) 50 MCG/ACT NASAL SPRAY    Place 2 sprays into both nostrils 2 (two) times daily.   IRON POLYSACCHARIDES (NIFEREX) 150 MG CAPSULE    Take 150 mg by mouth daily.   LEVOTHYROXINE (LEVOTHROID) 25 MCG TABLET    Take 1 tablet (25 mcg total) by mouth daily before breakfast.   LORAZEPAM (ATIVAN) 0.5 MG TABLET    Take one tablet by mouth every 8 hours as needed for anxiety   LORAZEPAM (ATIVAN) 0.5 MG TABLET    Take 0.5 mg by mouth 2 (two) times daily.   MAGNESIUM HYDROXIDE (MILK OF MAGNESIA) 400 MG/5ML SUSPENSION    Take 30 mLs by mouth daily as needed for mild constipation.   METOPROLOL TARTRATE (LOPRESSOR) 25 MG TABLET    Take 1 tablet (25 mg total) by mouth 2 (two) times daily.   MIRTAZAPINE (REMERON) 7.5 MG TABLET    Take 7.5 mg by mouth at bedtime.   POLYETHYLENE GLYCOL  (MIRALAX / GLYCOLAX) PACKET    Take 17 g by mouth daily.   POTASSIUM CHLORIDE SA (K-DUR,KLOR-CON) 20 MEQ TABLET    Take 2 tablets (40 mEq total) by mouth daily.   SENNA (SENOKOT) 8.6 MG TABLET    Take 2 tablets by mouth daily.   TAMSULOSIN HCL (FLOMAX) 0.4 MG CAPS    Take 1 capsule (0.4 mg total) by mouth daily after supper.   TORSEMIDE (DEMADEX) 10 MG TABLET    Take 10 mg by mouth daily.   VITAMIN D, CHOLECALCIFEROL, 400 UNITS TABS    Take 1 tablet by mouth daily.  Modified Medications   No medications on file  Discontinued Medications   No medications on file     SIGNIFICANT DIAGNOSTIC EXAMS  LABS REVIEWED: WILL DECLINE LABS    01-27-16: wbc 4.3; hgb 12.2; hct 36.8; mcv 87.6; plt 209; glucose 78; bun 19; creat 1.05; k+ 3.8; na++141; liver normal albumin 3.0; chol 135; ldl 79; trig 54; hdl 45; tsh 2.954; dig 0.9  06-06-16: glucose 79; bun 17; creat 1.07; k+ 4.4; na++ 141; liver normal albumin 2.8  07-04-16: wbc 4.2; hgb 11.7; hct 34.6; mcv 90.8; plt 308 08-17-16: glucose 73; bun 13; creat 1.07; k+ 4.2; na++ 141     Review of Systems Unable to perform ROS: Dementia      Physical Exam Constitutional: No distress. frail  Eyes: Conjunctivae are normal.  Neck: Neck supple. No JVD present. No thyromegaly present.  Cardiovascular: Normal rate and intact distal pulses.   Heart rate slightly irregular   Respiratory: Effort normal and breath sounds normal. No respiratory distress. He has no wheezes.  GI: Soft. Bowel sounds are normal. He exhibits no distension. There is no tenderness.  Musculoskeletal: He exhibits no edema.  Able to move all extremities   Lymphadenopathy:    He has no cervical adenopathy.  Neurological: He is alert.  Skin: Skin is warm and dry. He is not diaphoretic.  Psychiatric: He has a normal mood and affect.      ASSESSMENT/ PLAN:  1. Afib: his heart rate is stable; will continue amiodarone 200 mg daily; digoxin 0.125 mg daily lopressor 25 mg twice daily for  rate control  asa 81 mg daily will monitor dig level is 0.9  Has biventricular ICD in place   2. Diastolic heart failure: is stable will continue  demadex  10 mg daily with k+ 40 meq daily will monitor his status. He is status post icd placement   3. Hypothyroidism: will continue synthroid 25 mcg daily  tsh is 2.954  4. Alzheimer's disease: his disease state is advanced he is not taking medications at this time; will not make changes will monitor his status.   5. Bph: will continue flomax 0.4 mg daily   6. Anemia: will continue niferex daily hgb is 12.2   7. Constipation: will continue miralax daily; senna 2 tabs daily   8. Anxiety: will continue  ativan  0.5 mg twice daily and every 8 hours as needed  Is taking remeron 7.5 mg nightly to help with appetite  will not make changes will monitor  9. Hypertension: will continue lopressor 25 mg twice daily and norvasc 2.5 mg daily  will monitor   10. FTT/weight loss: his weight in March 2017 was 132 pounds 6 ounces his current weight is 132  pounds   Will continue supplements per facility protocol and will monitor his status.          MD is aware of resident's narcotic use and is in agreement with current plan of care. We will attempt to wean resident as appropriate.     [name] NP [facility] Adult Medicine  Contact [phone] Monday through Friday 8am- 5pm  After hours call [phone]

## 2016-11-22 ENCOUNTER — Encounter: Payer: Self-pay | Admitting: Adult Health

## 2016-11-22 ENCOUNTER — Non-Acute Institutional Stay (SKILLED_NURSING_FACILITY): Payer: Medicare Other | Admitting: Adult Health

## 2016-11-22 DIAGNOSIS — I509 Heart failure, unspecified: Secondary | ICD-10-CM | POA: Diagnosis not present

## 2016-11-22 DIAGNOSIS — R627 Adult failure to thrive: Secondary | ICD-10-CM | POA: Diagnosis not present

## 2016-11-22 DIAGNOSIS — R338 Other retention of urine: Secondary | ICD-10-CM | POA: Diagnosis not present

## 2016-11-22 DIAGNOSIS — E034 Atrophy of thyroid (acquired): Secondary | ICD-10-CM

## 2016-11-22 DIAGNOSIS — I5042 Chronic combined systolic (congestive) and diastolic (congestive) heart failure: Secondary | ICD-10-CM | POA: Diagnosis not present

## 2016-11-22 DIAGNOSIS — Z95810 Presence of automatic (implantable) cardiac defibrillator: Secondary | ICD-10-CM | POA: Diagnosis not present

## 2016-11-22 DIAGNOSIS — I110 Hypertensive heart disease with heart failure: Secondary | ICD-10-CM

## 2016-11-22 DIAGNOSIS — F0280 Dementia in other diseases classified elsewhere without behavioral disturbance: Secondary | ICD-10-CM | POA: Diagnosis not present

## 2016-11-22 DIAGNOSIS — N401 Enlarged prostate with lower urinary tract symptoms: Secondary | ICD-10-CM | POA: Diagnosis not present

## 2016-11-22 DIAGNOSIS — G300 Alzheimer's disease with early onset: Secondary | ICD-10-CM | POA: Diagnosis not present

## 2016-11-22 DIAGNOSIS — I482 Chronic atrial fibrillation, unspecified: Secondary | ICD-10-CM

## 2016-11-22 LAB — DIGOXIN LEVEL: DIGOXIN LVL: 0.4

## 2016-11-22 NOTE — Progress Notes (Signed)
Patient ID: Kevin [name], male   DOB: October 27, 1925, 82 y.o.   MRN: [id number]   Location:   [facility] Room Number: 141-B Place of Service:  SNF (31)   CODE STATUS:DNR  Allergies  Allergen Reactions  . Morphine Other (See Comments)    Per MAR  . Wellbutrin [Bupropion] Other (See Comments)    Per [facility]     Chief Complaint  Patient presents with  . Medical Management of Chronic Issues    Follow up    HPI:  He is a long term resident of this facility being seen for the management of his chronic illnesses. He does spend all of his time in bed per his choice. There are no reports of changes in appetite. He is unable to fully participate in the hpi or ros. There are no nursing concerns at this time.    Past Medical History:  Diagnosis Date  . AICD (automatic cardioverter/defibrillator) present   . Dementia   . Depressive disorder   . Hypertension   . Insomnia   . LBBB (left bundle branch block)   . Nonischemic cardiomyopathy (HCC)   . Obesity   . Sleep apnea    no cpap  . UTI (lower urinary tract infection)   . Vitamin D deficiency     Past Surgical History:  Procedure Laterality Date  . BACK SURGERY    . CARDIAC DEFIBRILLATOR PLACEMENT     [facility]  . ESOPHAGOGASTRODUODENOSCOPY  06/28/2012   Procedure: ESOPHAGOGASTRODUODENOSCOPY (EGD);  Surgeon: Petra Kuba, MD;  Location: [facility] ENDOSCOPY;  Service: Endoscopy;  Laterality: N/A;  . HERNIA REPAIR    . TOTAL KNEE ARTHROPLASTY     left    Social History   Social History  . Marital status: Married    Spouse name: N/A  . Number of children: N/A  . Years of education: N/A   Occupational History  . Not on file.   Social History Main Topics  . Smoking status: Never Smoker  . Smokeless tobacco: Never Used  . Alcohol use No  . Drug use: No  . Sexual activity: Not on file   Other Topics Concern  . Not on file   Social History Narrative  . No narrative on file   Family History  Problem Relation Age  of Onset  . Stroke Mother   . Lung cancer Brother   . Brain cancer Brother       VITAL SIGNS BP (!) 95/55   Pulse 83   Temp 98 F (36.7 C) (Oral)   Resp 18   Ht 5\' 4"  (1.626 m)   Wt 129 lb (58.5 kg)   SpO2 94%   BMI 22.14 kg/m   Patient's Medications  New Prescriptions   No medications on file  Previous Medications   ACETAMINOPHEN (TYLENOL) 325 MG TABLET    Take 650 mg by mouth every 4 (four) hours as needed.   AMIODARONE (PACERONE) 200 MG TABLET    Take 1 tablet (200 mg total) by mouth daily. Start amiodarone 200mg  daily on 12/24/14   ASPIRIN EC 81 MG TABLET    Take 1 tablet (81 mg total) by mouth daily.   CALCIUM CARBONATE (TUMS - DOSED IN MG ELEMENTAL CALCIUM) 500 MG CHEWABLE TABLET    Chew 1 tablet by mouth daily.   CARBOXYMETHYLCELLULOSE (REFRESH TEARS) 0.5 % SOLN    Place 1 drop into both eyes 3 (three) times daily.    CETIRIZINE HCL (ZYRTEC PO)  Take 5 mg by mouth daily.   DIGOXIN (LANOXIN) 0.125 MG TABLET    Take 1 tablet (0.125 mg total) by mouth daily.   DIPHENHYDRAMINE (BENADRYL) 25 MG TABLET    Take 25 mg by mouth as needed for itching.    FLUTICASONE (FLONASE) 50 MCG/ACT NASAL SPRAY    Place 2 sprays into both nostrils 2 (two) times daily.   IRON POLYSACCHARIDES (NIFEREX) 150 MG CAPSULE    Take 150 mg by mouth daily.   LEVOTHYROXINE (LEVOTHROID) 25 MCG TABLET    Take 1 tablet (25 mcg total) by mouth daily before breakfast.   LORAZEPAM (ATIVAN) 0.5 MG TABLET    Take one tablet by mouth every 8 hours as needed for anxiety   LORAZEPAM (ATIVAN) 0.5 MG TABLET    Take 0.5 mg by mouth 2 (two) times daily.   MAGNESIUM HYDROXIDE (MILK OF MAGNESIA) 400 MG/5ML SUSPENSION    Take 30 mLs by mouth daily as needed for mild constipation.   METOPROLOL TARTRATE (LOPRESSOR) 25 MG TABLET    Take 1 tablet (25 mg total) by mouth 2 (two) times daily.   MIRTAZAPINE (REMERON) 7.5 MG TABLET    Take 7.5 mg by mouth at bedtime.   POLYETHYLENE GLYCOL (MIRALAX / GLYCOLAX) PACKET    Take 17 g  by mouth daily.   POTASSIUM CHLORIDE SA (K-DUR,KLOR-CON) 20 MEQ TABLET    Take 2 tablets (40 mEq total) by mouth daily.   SENNA (SENOKOT) 8.6 MG TABLET    Take 2 tablets by mouth daily.   TAMSULOSIN HCL (FLOMAX) 0.4 MG CAPS    Take 1 capsule (0.4 mg total) by mouth daily after supper.   TORSEMIDE (DEMADEX) 10 MG TABLET    Take 10 mg by mouth daily.   VITAMIN D, CHOLECALCIFEROL, 400 UNITS TABS    Take 1 tablet by mouth daily.  Modified Medications   No medications on file  Discontinued Medications   No medications on file     SIGNIFICANT DIAGNOSTIC EXAMS  LABS REVIEWED: WILL DECLINE LABS    01-27-16: wbc 4.3; hgb 12.2; hct 36.8; mcv 87.6; plt 209; glucose 78; bun 19; creat 1.05; k+ 3.8; na++141; liver normal albumin 3.0; chol 135; ldl 79; trig 54; hdl 45; tsh 2.954; dig 0.9  06-06-16: glucose 79; bun 17; creat 1.07; k+ 4.4; na++ 141; liver normal albumin 2.8  07-04-16: wbc 4.2; hgb 11.7; hct 34.6; mcv 90.8; plt 308 08-17-16: glucose 73; bun 13; creat 1.07; k+ 4.2; na++ 141  10-25-16: dig <0.40    Review of Systems Unable to perform ROS: Dementia      Physical Exam Constitutional: No distress. frail  Eyes: Conjunctivae are normal.  Neck: Neck supple. No JVD present. No thyromegaly present.  Cardiovascular: Normal rate and intact distal pulses.   Heart rate slightly irregular   Respiratory: Effort normal and breath sounds normal. No respiratory distress. He has no wheezes.  GI: Soft. Bowel sounds are normal. He exhibits no distension. There is no tenderness.  Musculoskeletal: He exhibits no edema.  Able to move all extremities   Lymphadenopathy:    He has no cervical adenopathy.  Neurological: He is alert.  Skin: Skin is warm and dry. He is not diaphoretic.  Psychiatric: He has a normal mood and affect.      ASSESSMENT/ PLAN:  1. Afib: his heart rate is stable; will continue amiodarone 200 mg daily; digoxin 0.125 mg daily lopressor 25 mg twice daily for rate control asa 81  mg daily  will monitor dig level is <0.40  Has biventricular ICD in place   2. Diastolic heart failure: is stable will continue  demadex  10 mg daily with k+ 40 meq daily will monitor his status. He is status post icd placement   3. Hypothyroidism: will continue synthroid 25 mcg daily  tsh is 2.954  4. Alzheimer's disease: his disease state is advanced he is not taking medications at this time; will not make changes will monitor his status.   5. Bph: will continue flomax 0.4 mg daily   6. Anemia: will continue niferex daily hgb is 12.2   7. Constipation: will continue miralax daily; senna 2 tabs daily   8. Anxiety: will continue  ativan  0.5 mg twice daily and every 8 hours as needed  Is taking remeron 7.5 mg nightly to help with appetite  will not make changes will monitor  9. Hypertension: will continue lopressor 25 mg twice daily and norvasc 2.5 mg daily  will monitor   10. FTT/weight loss: his weight in March 2017 was 132 pounds 6 ounces his current weight is 129  pounds   Will continue supplements per facility protocol and will monitor his status.     MD is aware of resident's narcotic use and is in agreement with current plan of care. We will attempt to wean resident as apropriate   Synthia Innocenteborah Green NP [facility]  Contact [phone] Monday through Friday 8am- 5pm  After hours call [phone]

## 2016-12-25 ENCOUNTER — Non-Acute Institutional Stay (SKILLED_NURSING_FACILITY): Payer: Medicare Other | Admitting: Adult Health

## 2016-12-25 ENCOUNTER — Encounter: Payer: Self-pay | Admitting: Adult Health

## 2016-12-25 DIAGNOSIS — R338 Other retention of urine: Secondary | ICD-10-CM

## 2016-12-25 DIAGNOSIS — N401 Enlarged prostate with lower urinary tract symptoms: Secondary | ICD-10-CM

## 2016-12-25 DIAGNOSIS — R627 Adult failure to thrive: Secondary | ICD-10-CM

## 2016-12-25 DIAGNOSIS — I509 Heart failure, unspecified: Secondary | ICD-10-CM

## 2016-12-25 DIAGNOSIS — I482 Chronic atrial fibrillation, unspecified: Secondary | ICD-10-CM

## 2016-12-25 DIAGNOSIS — I110 Hypertensive heart disease with heart failure: Secondary | ICD-10-CM | POA: Diagnosis not present

## 2016-12-25 DIAGNOSIS — G300 Alzheimer's disease with early onset: Secondary | ICD-10-CM

## 2016-12-25 DIAGNOSIS — I5042 Chronic combined systolic (congestive) and diastolic (congestive) heart failure: Secondary | ICD-10-CM

## 2016-12-25 DIAGNOSIS — F0280 Dementia in other diseases classified elsewhere without behavioral disturbance: Secondary | ICD-10-CM

## 2016-12-25 DIAGNOSIS — Z95810 Presence of automatic (implantable) cardiac defibrillator: Secondary | ICD-10-CM

## 2016-12-25 NOTE — Progress Notes (Signed)
Provider:   [name] NP  Location:  [facility]    Place of Service:  SNF (31)   PCP: Kirt Boys, DO Patient Care Team: Kirt Boys, DO as PCP - General (Internal Medicine) Sharee Holster, NP as Nurse Practitioner (Nurse Practitioner) [facility]  Extended Emergency Contact Information Primary Emergency Contact: [place] Address: [address]          [place], [place] [address] Darden Amber of [place] Home Phone: [phone] Mobile Phone: [phone] Relation: Daughter Secondary Emergency Contact: Dionisio Paschal Address: [address]          [place], [place] [address] Darden Amber of [place] Home Phone: [phone] Mobile Phone: [phone] Relation: Relative  Code Status: dnr Goals of Care: Advanced Directive information Advanced Directives 11/22/2016  Does Patient Have a Medical Advance Directive? Yes  Type of Advance Directive Out of facility DNR (pink MOST or yellow form)  Does patient want to make changes to medical advance directive? -  Copy of Healthcare Power of Attorney in Chart? -  Pre-existing out of facility DNR order (yellow form or pink MOST form) -      Allergies  Allergen Reactions  . Morphine Other (See Comments)    Per MAR  . Wellbutrin [Bupropion] Other (See Comments)    Per [facility]      Chief Complaint  Patient presents with  . Annual Exam    HPI: Patient is a 83 y.o. male seen today for an annual comprehensive examination. There has been no significant change in his status. He has not been hospitalized over the past year. He does spend all of his time in bed per his choice. He is unable to fully participate in the hpi or ros. There are no nursing concerns at this time.    Past Medical History:  Diagnosis Date  . AICD (automatic cardioverter/defibrillator) present   . Dementia   . Depressive disorder   . Hypertension   . Insomnia   . LBBB (left bundle branch block)   .  Nonischemic cardiomyopathy (HCC)   . Obesity   . Sleep apnea    no cpap  . UTI (lower urinary tract infection)   . Vitamin D deficiency    Past Surgical History:  Procedure Laterality Date  . BACK SURGERY    . CARDIAC DEFIBRILLATOR PLACEMENT     [facility]  . ESOPHAGOGASTRODUODENOSCOPY  06/28/2012   Procedure: ESOPHAGOGASTRODUODENOSCOPY (EGD);  Surgeon: Petra Kuba, MD;  [facility] END[facility]COPY;  Service: Endoscopy;  Laterality: N/A;  . HERNIA REPAIR    . TOTAL KNEE ARTHROPLASTY     left    reports that he has never smoked. He has never used smokeless tobacco. He reports that he does not drink alcohol or use drugs. Social History   Social History  . Marital status: Married    Spouse name: N/A  . Number of children: N/A  . Years of education: N/A   Occupational History  . Not on file.   Social History Main Topics  . Smoking status: Never Smoker  . Smokeless tobacco: Never Used  . Alcohol use No  . Drug use: No  . Sexual activity: Not on file   Other Topics Concern  . Not on file   Social History Narrative  . No narrative on file   Family History  Problem Relation Age of Onset  . Stroke Mother   . Lung cancer Brother   . Brain cancer Brother  Vitals:   12/25/16 1406  BP: (!) 112/56  Pulse: 72  Resp: 18  Temp: 97.5 F (36.4 C)  SpO2: 95%  Weight: 129 lb (58.5 kg)  Height: 5\' 4"  (1.626 m)   Body mass index is 22.14 kg/m.    Medication Sig  . acetaminophen (TYLENOL) 325 MG tablet Take 650 mg by mouth every 4 (four) hours as needed.  Marland Kitchen amiodarone (PACERONE) 200 MG tablet Take 1 tablet (200 mg total) by mouth daily. Start amiodarone 200mg  daily on 12/24/14  . aspirin EC 81 MG tablet Take 1 tablet (81 mg total) by mouth daily.  . calcium carbonate (TUMS - DOSED IN MG ELEMENTAL CALCIUM) 500 MG chewable tablet Chew 1 tablet by mouth daily.  . carboxymethylcellulose (REFRESH TEARS) 0.5 % SOLN Place 1 drop into both eyes 3 (three) times daily.   . Cetirizine  HCl (ZYRTEC PO) Take 5 mg by mouth daily.  . digoxin (LANOXIN) 0.125 MG tablet Take 1 tablet (0.125 mg total) by mouth daily.  . diphenhydrAMINE (BENADRYL) 25 MG tablet Take 25 mg by mouth as needed for itching.   . fluticasone (FLONASE) 50 MCG/ACT nasal spray Place 2 sprays into both nostrils 2 (two) times daily.  . iron polysaccharides (NIFEREX) 150 MG capsule Take 150 mg by mouth daily.  Marland Kitchen levothyroxine (LEVOTHROID) 25 MCG tablet Take 1 tablet (25 mcg total) by mouth daily before breakfast.  . LORazepam (ATIVAN) 0.5 MG tablet Take one tablet by mouth every 8 hours as needed for anxiety  . LORazepam (ATIVAN) 0.5 MG tablet Take 0.5 mg by mouth 2 (two) times daily.  . magnesium hydroxide (MILK OF MAGNESIA) 400 MG/5ML suspension Take 30 mLs by mouth daily as needed for mild constipation.  . metoprolol tartrate (LOPRESSOR) 25 MG tablet Take 1 tablet (25 mg total) by mouth 2 (two) times daily. (Patient taking differently: Take 12.5 mg by mouth 2 (two) times daily. )  . mirtazapine (REMERON) 7.5 MG tablet Take 7.5 mg by mouth at bedtime.  . polyethylene glycol (MIRALAX / GLYCOLAX) packet Take 17 g by mouth daily.  . potassium chloride SA (K-DUR,KLOR-CON) 20 MEQ tablet Take 2 tablets (40 mEq total) by mouth daily.  Marland Kitchen senna (SENOKOT) 8.6 MG tablet Take 2 tablets by mouth daily.  . Tamsulosin HCl (FLOMAX) 0.4 MG CAPS Take 1 capsule (0.4 mg total) by mouth daily after supper.  . torsemide (DEMADEX) 10 MG tablet Take 10 mg by mouth daily.  . Vitamin D, Cholecalciferol, 400 units TABS Take 1 tablet by mouth daily.   No current facility-administered medications on file prior to visit.       SIGNIFICANT DIAGNOSTIC EXAMS    LABS REVIEWED: WILL DECLINE LABS    01-27-16: wbc 4.3; hgb 12.2; hct 36.8; mcv 87.6; plt 209; glucose 78; bun 19; creat 1.05; k+ 3.8; na++141; liver normal albumin 3.0; chol 135; ldl 79; trig 54; hdl 45; tsh 2.954; dig 0.9  06-06-16: glucose 79; bun 17; creat 1.07; k+ 4.4; na++ 141;  liver normal albumin 2.8  07-04-16: wbc 4.2; hgb 11.7; hct 34.6; mcv 90.8; plt 308 08-17-16: glucose 73; bun 13; creat 1.07; k+ 4.2; na++ 141  10-25-16: dig <0.40    Review of Systems Unable to perform ROS: Dementia      Physical Exam Constitutional: No distress. frail  Eyes: Conjunctivae are normal.  Neck: Neck supple. No JVD present. No thyromegaly present.  Cardiovascular: Normal rate and intact distal pulses.   Heart rate slightly irregular   Respiratory:  Effort normal and breath sounds normal. No respiratory distress. He has no wheezes.  GI: Soft. Bowel sounds are normal. He exhibits no distension. There is no tenderness.  Musculoskeletal: He exhibits no edema.  Able to move all extremities   Lymphadenopathy:    He has no cervical adenopathy.  Neurological: He is alert.  Skin: Skin is warm and dry. He is not diaphoretic.  Psychiatric: He has a normal mood and affect.      ASSESSMENT/ PLAN:  1. Afib: his heart rate is stable; will continue amiodarone 200 mg daily; digoxin 0.125 mg daily lopressor 25 mg twice daily for rate control asa 81 mg daily will monitor dig level is <0.40  Has biventricular ICD in place   2. Diastolic heart failure: is stable will continue  demadex  10 mg daily with k+ 40 meq daily will monitor his status. He is status post icd placement   3. Hypothyroidism: will continue synthroid 25 mcg daily  tsh is 2.954  4. Alzheimer's disease: his disease state is advanced he is not taking medications at this time; will not make changes will monitor his status.   5. Bph: will continue flomax 0.4 mg daily   6. Anemia: will continue niferex daily hgb is 12.2   7. Constipation: will continue miralax daily; senna 2 tabs daily   8. Anxiety: will continue  ativan  0.5 mg twice daily and every 8 hours as needed  Is taking remeron 7.5 mg nightly to help with appetite  will not make changes will monitor  9. Hypertension: will continue lopressor 25 mg twice daily  and norvasc 2.5 mg daily  will monitor   10. FTT/weight loss: his weight in March 2017 was 132 pounds 6 ounces his current weight is 129  pounds   Will continue supplements per facility protocol and will monitor his status.    Will check cbc; cmp; tsh `  His health maintenance is up to date   Time spent with patient   45 minutes >50% time spent counseling; reviewing medical record; tests; labs; and developing future plan of care    MD is aware of resident's narcotic use and is in agreement with current plan of care. We will wean dosage as appropriate for resident   [name] NP [facility]  Contact [phone] Monday through Friday 8am- 5pm  After hours call [phone]

## 2016-12-26 LAB — HEPATIC FUNCTION PANEL
ALT: 5 U/L — AB (ref 10–40)
AST: 9 U/L — AB (ref 14–40)
Alkaline Phosphatase: 139 U/L — AB (ref 25–125)
BILIRUBIN, TOTAL: 0.3 mg/dL

## 2016-12-26 LAB — TSH: TSH: 4.63 u[IU]/mL (ref 0.41–5.90)

## 2016-12-26 LAB — BASIC METABOLIC PANEL
BUN: 28 mg/dL — AB (ref 4–21)
Creatinine: 1.2 mg/dL (ref 0.6–1.3)
GLUCOSE: 103 mg/dL
Potassium: 4.3 mmol/L (ref 3.4–5.3)
SODIUM: 138 mmol/L (ref 137–147)

## 2016-12-26 LAB — CBC AND DIFFERENTIAL
HEMATOCRIT: 38 % — AB (ref 41–53)
Hemoglobin: 12.5 g/dL — AB (ref 13.5–17.5)
NEUTROS ABS: 2 /uL
PLATELETS: 202 10*3/uL (ref 150–399)
WBC: 4 10*3/mL

## 2017-01-31 ENCOUNTER — Non-Acute Institutional Stay (SKILLED_NURSING_FACILITY): Payer: Medicare Other | Admitting: Adult Health

## 2017-01-31 DIAGNOSIS — F0280 Dementia in other diseases classified elsewhere without behavioral disturbance: Secondary | ICD-10-CM

## 2017-01-31 DIAGNOSIS — R627 Adult failure to thrive: Secondary | ICD-10-CM

## 2017-01-31 DIAGNOSIS — I482 Chronic atrial fibrillation, unspecified: Secondary | ICD-10-CM

## 2017-01-31 DIAGNOSIS — I110 Hypertensive heart disease with heart failure: Secondary | ICD-10-CM

## 2017-01-31 DIAGNOSIS — I5042 Chronic combined systolic (congestive) and diastolic (congestive) heart failure: Secondary | ICD-10-CM

## 2017-01-31 DIAGNOSIS — G300 Alzheimer's disease with early onset: Secondary | ICD-10-CM

## 2017-01-31 DIAGNOSIS — Z95810 Presence of automatic (implantable) cardiac defibrillator: Secondary | ICD-10-CM

## 2017-01-31 DIAGNOSIS — I509 Heart failure, unspecified: Secondary | ICD-10-CM

## 2017-01-31 DIAGNOSIS — E034 Atrophy of thyroid (acquired): Secondary | ICD-10-CM | POA: Diagnosis not present

## 2017-01-31 DIAGNOSIS — N401 Enlarged prostate with lower urinary tract symptoms: Secondary | ICD-10-CM

## 2017-01-31 DIAGNOSIS — R338 Other retention of urine: Secondary | ICD-10-CM

## 2017-03-04 NOTE — Progress Notes (Signed)
Location:   Database administrator of Service:  SNF (31)   CODE STATUS: dnr  Allergies  Allergen Reactions  . Morphine Other (See Comments)    Per MAR  . Wellbutrin [Bupropion] Other (See Comments)    Per [facility]     Chief Complaint  Patient presents with  . Medical Management of Chronic Issues    HPI:  He is a long term resident of this facility being seen for the management of his chronic illnesses. Overall there is little change in his status. He does spend all of his time in bed. He is unable to fully participate in the hpi or ros. There are no nursing concerns at this time.    Past Medical History:  Diagnosis Date  . AICD (automatic cardioverter/defibrillator) present   . Dementia   . Depressive disorder   . Hypertension   . Insomnia   . LBBB (left bundle branch block)   . Nonischemic cardiomyopathy (HCC)   . Obesity   . Sleep apnea    no cpap  . UTI (lower urinary tract infection)   . Vitamin D deficiency     Past Surgical History:  Procedure Laterality Date  . BACK SURGERY    . CARDIAC DEFIBRILLATOR PLACEMENT     [facility]  . ESOPHAGOGASTRODUODENOSCOPY  06/28/2012   Procedure: ESOPHAGOGASTRODUODENOSCOPY (EGD);  Surgeon: Petra Kuba, MD;  Location: [facility] ENDOSCOPY;  Service: Endoscopy;  Laterality: N/A;  . HERNIA REPAIR    . TOTAL KNEE ARTHROPLASTY     left    Social History   Social History  . Marital status: Married    Spouse name: N/A  . Number of children: N/A  . Years of education: N/A   Occupational History  . Not on file.   Social History Main Topics  . Smoking status: Never Smoker  . Smokeless tobacco: Never Used  . Alcohol use No  . Drug use: No  . Sexual activity: Not on file   Other Topics Concern  . Not on file   Social History Narrative  . No narrative on file   Family History  Problem Relation Age of Onset  . Stroke Mother   . Lung cancer Brother   . Brain cancer Brother       VITAL SIGNS BP (!) 90/50   Pulse 81    Temp 97.7 F (36.5 C)   Resp 18   Ht 5\' 4"  (1.626 m)   Wt 130 lb 3.2 oz (59.1 kg)   SpO2 96%   BMI 22.35 kg/m   Patient's Medications  New Prescriptions   No medications on file  Previous Medications   ACETAMINOPHEN (TYLENOL) 325 MG TABLET    Take 650 mg by mouth every 4 (four) hours as needed.   AMIODARONE (PACERONE) 200 MG TABLET    Take 1 tablet (200 mg total) by mouth daily. Start amiodarone 200mg  daily on 12/24/14   ASPIRIN EC 81 MG TABLET    Take 1 tablet (81 mg total) by mouth daily.   CALCIUM CARBONATE (TUMS - DOSED IN MG ELEMENTAL CALCIUM) 500 MG CHEWABLE TABLET    Chew 1 tablet by mouth daily.   CARBOXYMETHYLCELLULOSE (REFRESH TEARS) 0.5 % SOLN    Place 1 drop into both eyes 3 (three) times daily.    CETIRIZINE HCL (ZYRTEC PO)    Take 5 mg by mouth daily.   DIGOXIN (LANOXIN) 0.125 MG TABLET    Take 1 tablet (0.125 mg total)  by mouth daily.   DIPHENHYDRAMINE (BENADRYL) 25 MG TABLET    Take 25 mg by mouth as needed for itching.    FLUTICASONE (FLONASE) 50 MCG/ACT NASAL SPRAY    Place 2 sprays into both nostrils 2 (two) times daily.   IRON POLYSACCHARIDES (NIFEREX) 150 MG CAPSULE    Take 150 mg by mouth daily.   LEVOTHYROXINE (LEVOTHROID) 25 MCG TABLET    Take 1 tablet (25 mcg total) by mouth daily before breakfast.   LORAZEPAM (ATIVAN) 0.5 MG TABLET    Take one tablet by mouth every 8 hours as needed for anxiety   LORAZEPAM (ATIVAN) 0.5 MG TABLET    Take 0.5 mg by mouth 2 (two) times daily.   MAGNESIUM HYDROXIDE (MILK OF MAGNESIA) 400 MG/5ML SUSPENSION    Take 30 mLs by mouth daily as needed for mild constipation.   METOPROLOL TARTRATE (LOPRESSOR) 25 MG TABLET    Take 1 tablet (25 mg total) by mouth 2 (two) times daily.   MIRTAZAPINE (REMERON) 7.5 MG TABLET    Take 7.5 mg by mouth at bedtime.   POLYETHYLENE GLYCOL (MIRALAX / GLYCOLAX) PACKET    Take 17 g by mouth daily.   POTASSIUM CHLORIDE SA (K-DUR,KLOR-CON) 20 MEQ TABLET    Take 2 tablets (40 mEq total) by mouth daily.   SENNA  (SENOKOT) 8.6 MG TABLET    Take 2 tablets by mouth daily.   TAMSULOSIN HCL (FLOMAX) 0.4 MG CAPS    Take 1 capsule (0.4 mg total) by mouth daily after supper.   TORSEMIDE (DEMADEX) 10 MG TABLET    Take 10 mg by mouth daily.   VITAMIN D, CHOLECALCIFEROL, 400 UNITS TABS    Take 1 tablet by mouth daily.  Modified Medications   No medications on file  Discontinued Medications   No medications on file     SIGNIFICANT DIAGNOSTIC EXAMS    LABS REVIEWED: WILL DECLINE LABS    06-06-16: glucose 79; bun 17; creat 1.07; k+ 4.4; na++ 141; liver normal albumin 2.8  07-04-16: wbc 4.2; hgb 11.7; hct 34.6; mcv 90.8; plt 308 08-17-16: glucose 73; bun 13; creat 1.07; k+ 4.2; na++ 141  10-25-16: dig <0.40 12-26-16: wbc 4.0; hgb 12.5; hct 38.4; mcv 96.2; plt 202; glucose 103; bun 27.5; creat 1.17; k+ 4.3; na++ 138; liver normal albumin 2.9; tsh 4.63     Review of Systems Unable to perform ROS: Dementia      Physical Exam Constitutional: No distress. frail  Eyes: Conjunctivae are normal.  Neck: Neck supple. No JVD present. No thyromegaly present.  Cardiovascular: Normal rate and intact distal pulses.   Heart rate slightly irregular   Respiratory: Effort normal and breath sounds normal. No respiratory distress. He has no wheezes.  GI: Soft. Bowel sounds are normal. He exhibits no distension. There is no tenderness.  Musculoskeletal: He exhibits no edema.  Able to move all extremities   Lymphadenopathy:    He has no cervical adenopathy.  Neurological: He is alert.  Skin: Skin is warm and dry. He is not diaphoretic.  Psychiatric: He has a normal mood and affect.      ASSESSMENT/ PLAN:  1. Afib: his heart rate is stable; will continue amiodarone 200 mg daily; digoxin 0.125 mg daily lopressor 25 mg twice daily for rate control asa 81 mg daily will monitor dig level is <0.40  Has biventricular ICD in place   2. Diastolic heart failure: is stable will continue  demadex  10 mg daily with k+  40 meq  daily will monitor his status. He is status post icd placement   3. Hypothyroidism: will continue synthroid 25 mcg daily  tsh is 4.63  4. Alzheimer's disease: his disease state is advanced he is not taking medications at this time; will not make changes will monitor his status.   5. Bph: will continue flomax 0.4 mg daily   6. Anemia: will continue niferex daily hgb is 12.5  7. Constipation: will continue miralax daily; senna 2 tabs daily   8. Anxiety: will continue  ativan  0.5 mg twice daily and every 8 hours as needed  Is taking remeron 7.5 mg nightly to help with appetite  will not make changes will monitor  9. Hypertension: will lower his lopressor to one time daily and will monitor   10. FTT/weight loss: his weight in March 2017 was 132 pounds 6 ounces his current weight is 130 pounds   Will continue supplements per facility protocol and will monitor his status.         MD is aware of resident's narcotic use and is in agreement with current plan of care. We will attempt to wean resident as apropriate   [name] NP [facility]  Contact [phone] Monday through Friday 8am- 5pm  After hours call [phone]

## 2017-03-13 ENCOUNTER — Encounter: Payer: Self-pay | Admitting: Internal Medicine

## 2017-03-13 ENCOUNTER — Non-Acute Institutional Stay (SKILLED_NURSING_FACILITY): Payer: Medicare Other | Admitting: Internal Medicine

## 2017-03-13 DIAGNOSIS — H1031 Unspecified acute conjunctivitis, right eye: Secondary | ICD-10-CM

## 2017-03-13 DIAGNOSIS — Z95810 Presence of automatic (implantable) cardiac defibrillator: Secondary | ICD-10-CM

## 2017-03-13 DIAGNOSIS — R4189 Other symptoms and signs involving cognitive functions and awareness: Secondary | ICD-10-CM

## 2017-03-13 NOTE — Progress Notes (Signed)
Patient ID: Kevin [name], male   DOB: 12-06-1925, 83 y.o.   MRN: [id number]   This is a nursing facility emergent follow up for specific acute issue of mental status change manifested as unresponsiveness.  Interim medical record and care since last [facility] visit was updated with review of diagnostic studies and change in clinical status since last visit were documented.  HPI: This morning the patient was found unresponsive with cyanotic lips. O2 sats. Varied from the 80s to high 90s. Antibiotic drops had been ordered for apparent right conjunctivitis 3/20 Significantly he does have a history of atrial fibrillation is on amiodarone and low-dose aspirin. Also has a AICD. Other diagnoses include hypertension, sleep apnea depression, dementia, hypothyroidism and congestive heart failure. TSH was therapeutic on 12/26/16.  Review of systems: No history could be obtained from the patient due to his clinical state. Physical exam:  Pertinent or positive findings: The patient was unresponsive to sternal rub or pressure to the superior orbits. Cyanosis of lips was suggested despite O2 sats of 91%. Erythema of the right lids with edema was visible even from the foot of the bed. With great difficulty the eye was opened and gray-yellow material noted. The patient resisted evaluation of the eyes and also intraoral exam. Edema of the right lids was also present. Heart sounds are distant but clinically were regular. Breath sounds are decreased with scattered rales. He has atrophy of the limbs particularly the right thigh area. Both knees deformed with marked contraction of the left knee. He also has contractions of R toes. Toenails are thickened. The large toes are deviated laterally. Limbs were areflexic and limp. Pedal pulses were decreased. Cyanosis was not visualized in the digits.   General appearance:Adequately nourished; no acute distress , increased work of breathing is present.     Lymphatic: No lymphadenopathy about the head, neck, axilla . Eyes: There is no scleral icterus. Ears:  External ear exam shows no significant lesions or deformities.   Nose:  External nasal examination shows no deformity or inflammation. Nasal mucosa are pink and moist without lesions ,exudates Neck:  No thyromegaly, masses, tenderness noted.    Heart:  Normal rate and regular rhythm. S1 normal without gallop, murmur, click, rub . S2 slightly increased Abdomen:Bowel sounds are normal. Abdomen is soft and nontender with no organomegaly, hernias,masses. GU: deferred, diapered Extremities:  No clubbing,edema  Neurologic exam : Skin: Warm & dry w/o tenting. No significant lesions or rash.  #1 unresponsive state, differential diagnosis includes CVA versus orbital cellulitis #2  frank conjunctivitis, rule out orbital cellulitis with associated neurologic findings #3 Hx of atrial fibrillation with low-dose prophylactic aspirin. Clinically rhythm is regular. He does have an AICD. Plan: Full dose enteric aspirin, Plavix Rocephin and antibiotic ointment Review med list and discontinue sedating medications

## 2017-03-13 NOTE — Patient Instructions (Signed)
See assessment and plan under each acute diagnosis

## 2017-03-13 NOTE — Progress Notes (Deleted)
     This is a nursing facility follow up for specific acute issue of  of chronic medical diagnoses  Nursing Facility readmission within 30 days  Interim medical record and care since last [facility] visit was updated with review of diagnostic studies and change in clinical status since last visit were documented.  HPI:  Review of systems: Dementia invalidated responses. Date given as   Constitutional: No fever,significant weight change, fatigue  Eyes: No redness, discharge, pain, vision change ENT/mouth: No nasal congestion,  purulent discharge, earache,change in hearing ,sore throat  Cardiovascular: No chest pain, palpitations,paroxysmal nocturnal dyspnea, claudication, edema  Respiratory: No cough, sputum production,hemoptysis, DOE , significant snoring,apnea  Gastrointestinal: No heartburn,dysphagia,abdominal pain, nausea / vomiting,rectal bleeding, melena,change in bowels Genitourinary: No dysuria,hematuria, pyuria,  incontinence, nocturia Musculoskeletal: No joint stiffness, joint swelling, weakness,pain Dermatologic: No rash, pruritus, change in appearance of skin Neurologic: No dizziness,headache,syncope, seizures, numbness , tingling Psychiatric: No significant anxiety , depression, insomnia, anorexia Endocrine: No change in hair/skin/ nails, excessive thirst, excessive hunger, excessive urination  Hematologic/lymphatic: No significant bruising, lymphadenopathy,abnormal bleeding Allergy/immunology: No itchy/ watery eyes, significant sneezing, urticaria, angioedema  Physical exam:  Pertinent or positive findings: General appearance:Adequately nourished; no acute distress , increased work of breathing is present.   Lymphatic: No lymphadenopathy about the head, neck, axilla . Eyes: No conjunctival inflammation or lid edema is present. There is no scleral icterus. Ears:  External ear exam shows no significant lesions or deformities.   Nose:  External nasal  examination shows no deformity or inflammation. Nasal mucosa are pink and moist without lesions ,exudates Oral exam: lips and gums are healthy appearing.There is no oropharyngeal erythema or exudate . Neck:  No thyromegaly, masses, tenderness noted.    Heart:  Normal rate and regular rhythm. S1 and S2 normal without gallop, murmur, click, rub .  Lungs:Chest clear to auscultation without wheezes, rhonchi,rales , rubs. Abdomen:Bowel sounds are normal. Abdomen is soft and nontender with no organomegaly, hernias,masses. GU: deferred  Extremities:  No cyanosis, clubbing,edema  Neurologic exam : Cn 2-7 intact Strength equal  in upper & lower extremities Balance,Rhomberg,finger to nose testing could not be completed due to clinical state Deep tendon reflexes are equal Skin: Warm & dry w/o tenting. No significant lesions or rash.  See summary under each active problem in the Problem List with associated updated therapeutic plan as well as for each new diagnosis @ this visit

## 2017-03-13 NOTE — Assessment & Plan Note (Signed)
03/13/17 rhythm is regular clinically

## 2017-04-11 LAB — BASIC METABOLIC PANEL
BUN: 16 mg/dL (ref 4–21)
Creatinine: 1 mg/dL (ref 0.6–1.3)
GLUCOSE: 75 mg/dL
Potassium: 3.9 mmol/L (ref 3.4–5.3)
SODIUM: 143 mmol/L (ref 137–147)

## 2017-04-11 LAB — CBC AND DIFFERENTIAL
HEMATOCRIT: 34 % — AB (ref 41–53)
Hemoglobin: 11 g/dL — AB (ref 13.5–17.5)
Platelets: 168 10*3/uL (ref 150–399)
WBC: 4.1 10^3/mL

## 2017-05-28 ENCOUNTER — Non-Acute Institutional Stay (SKILLED_NURSING_FACILITY): Payer: Medicare Other | Admitting: Internal Medicine

## 2017-05-28 ENCOUNTER — Encounter: Payer: Self-pay | Admitting: Internal Medicine

## 2017-05-28 DIAGNOSIS — F0281 Dementia in other diseases classified elsewhere with behavioral disturbance: Secondary | ICD-10-CM

## 2017-05-28 DIAGNOSIS — F02818 Dementia in other diseases classified elsewhere, unspecified severity, with other behavioral disturbance: Secondary | ICD-10-CM

## 2017-05-28 DIAGNOSIS — E034 Atrophy of thyroid (acquired): Secondary | ICD-10-CM

## 2017-05-28 DIAGNOSIS — Z95810 Presence of automatic (implantable) cardiac defibrillator: Secondary | ICD-10-CM

## 2017-05-28 DIAGNOSIS — L89520 Pressure ulcer of left ankle, unstageable: Secondary | ICD-10-CM

## 2017-05-28 DIAGNOSIS — I10 Essential (primary) hypertension: Secondary | ICD-10-CM

## 2017-05-28 DIAGNOSIS — R627 Adult failure to thrive: Secondary | ICD-10-CM | POA: Diagnosis not present

## 2017-05-28 DIAGNOSIS — I482 Chronic atrial fibrillation, unspecified: Secondary | ICD-10-CM

## 2017-05-28 DIAGNOSIS — G300 Alzheimer's disease with early onset: Secondary | ICD-10-CM

## 2017-05-28 DIAGNOSIS — I5042 Chronic combined systolic (congestive) and diastolic (congestive) heart failure: Secondary | ICD-10-CM

## 2017-05-28 NOTE — Progress Notes (Addendum)
Patient ID: Kevin [name], male   DOB: 10/26/1925, 83 y.o.   MRN: [id number]    DATE: 05/28/2017  Location:    [facility] Room Number: 141 B Place of Service: SNF (31)   Extended Emergency Contact Information Primary Emergency Contact: Keener,Janet Address: [address]          [place], [place] [address] Darden [place] of [place] Home Phone: [phone] Mobile Phone: [phone] Relation: Daughter Secondary Emergency Contact: Dionisio Paschal Address: [address]          [place], [place] [address] [place] of [place] Home Phone: [phone] Mobile Phone: [phone] Relation: Relative  Advanced Directive information Does Patient Have a Medical Advance Directive?: Yes, Type of Advance Directive: Out of facility DNR (pink MOST or yellow form), Pre-existing out of facility DNR order (yellow form or pink MOST form): Yellow form placed in chart (order not valid for inpatient use), Does patient want to make changes to medical advance directive?: No - Patient declined  Chief Complaint  Patient presents with  . Medical Management of Chronic Issues    Routine Visit [facility]    HPI:  83 yo male long term resident seen today for f/u. Facility wound care provider treating left lateral ankle pinhole sized ulcer. Pt likes to lie in bed on his side with legs tucked under him. No f/c. No d/c. No falls. Appetite poor overall. Sleeps ok.  He is a poor historian due to dementia. Hx obtained from chart.  PAF - rate controlled on amiodarone 200 mg daily; digoxin 0.125 mg daily lopressor 25 mg twice daily; also takes ASA 325 mg daily for anticoagulation  Chronic systolic and Diastolic heart failure - stable on demadex 10 mg daily with k+ 40 meq daily. He is s/p biventricular ICD   Hypothyroidism - stable on synthroid 25 mcg daily . TSH 4.63  Alzheimer's disease -  Advanced. Currently not on medications at this time. He has behavioral disturbance and takes seroquel qHS  BPH - stable  on flomax 0.4 mg daily   Hx Anemia - stable. Hgb 12.5; takes niferex daily  Chronic Constipation - stable on miralax daily; senna 2 tabs daily   Anxiety - stable on ativan  0.5 mg twice daily  Hypertension - stable on lopressor daily  FTT/weight loss - continues to slowly lose weight but has been stable over last 6 mos (129-130lbs) He gets supplements per facility protocol; gets remeron to help with appetite. No recent albumin level   Past Medical History:  Diagnosis Date  . AICD (automatic cardioverter/defibrillator) present   . Dementia   . Depressive disorder   . Hypertension   . Insomnia   . LBBB (left bundle branch block)   . Nonischemic cardiomyopathy (HCC)   . Obesity   . Sleep apnea    no cpap  . UTI (lower urinary tract infection)   . Vitamin D deficiency     Past Surgical History:  Procedure Laterality Date  . BACK SURGERY    . CARDIAC DEFIBRILLATOR PLACEMENT     [facility]  . ESOPHAGOGASTRODUODENOSCOPY  06/28/2012   Procedure: ESOPHAGOGASTRODUODENOSCOPY (EGD);  Surgeon: Petra Kuba, MD;  Location: [facility];  Service: Endoscopy;  Laterality: N/A;  . HERNIA REPAIR    . TOTAL KNEE ARTHROPLASTY     left    Patient Care Team: Kirt Boys, DO as PCP - [facility] (Internal [facility], [facility]  Social History   Social History  .  Marital status: Married    Spouse name: N/A  . Number of children: N/A  . Years of education: N/A   Occupational History  . Not on file.   Social History Main Topics  . Smoking status: Never Smoker  . Smokeless tobacco: Never Used  . Alcohol use No  . Drug use: No  . Sexual activity: Not on file   Other Topics Concern  . Not on file   Social History Narrative  . No narrative on file     reports that he has never smoked. He has never used smokeless tobacco. He reports that he does not drink alcohol or use drugs.  Family History  Problem Relation Age of Onset  . Stroke  Mother   . Lung cancer Brother   . Brain cancer Brother    Family Status  Relation Status  . Mother Deceased  . Father Deceased  . Brother Deceased  . Brother Deceased  . Sister Deceased  . Brother (Not Specified)  . Brother (Not Specified)    Immunization History  Administered Date(s) Administered  . Influenza Split 10/22/2011, 09/26/2012, 10/14/2013  . Influenza-Unspecified 10/06/2014, 09/16/2015, 10/25/2016  . PPD Test 10/20/2010  . Pneumococcal Conjugate-13 08/24/2009, 04/10/2017  . Pneumococcal-Unspecified 10/12/2014    Allergies  Allergen Reactions  . Morphine Other (See Comments)    Per MAR  . Wellbutrin [Bupropion] Other (See Comments)    Per MAR     Medications: Patient's Medications  New Prescriptions   No medications on file  Previous Medications   ACETAMINOPHEN (TYLENOL) 325 MG TABLET    Take 650 mg by mouth every 4 (four) hours as needed.   AMINO ACIDS-PROTEIN HYDROLYS (FEEDING SUPPLEMENT, PRO-STAT SUGAR FREE 64,) LIQD    Take 30 mLs by mouth 2 (two) times daily.   AMIODARONE (PACERONE) 200 MG TABLET    Take 1 tablet (200 mg total) by mouth daily. Start amiodarone 200mg  daily on 12/24/14   ASPIRIN 325 MG EC TABLET    Take 325 mg by mouth daily.   CALCIUM CARBONATE (TUMS - DOSED IN MG ELEMENTAL CALCIUM) 500 MG CHEWABLE TABLET    Chew 1 tablet by mouth daily.   CARBOXYMETHYLCELLULOSE (REFRESH TEARS) 0.5 % SOLN    Place 1 drop into both eyes 3 (three) times daily.    CHOLECALCIFEROL (VITAMIN D) 400 UNITS TABS TABLET    Take 400 Units by mouth daily.   CLOPIDOGREL (PLAVIX) 75 MG TABLET    Take 75 mg by mouth daily.   DIGOXIN (LANOXIN) 0.125 MG TABLET    Take 1 tablet (0.125 mg total) by mouth daily.   FLUTICASONE (FLONASE) 50 MCG/ACT NASAL SPRAY    Place 2 sprays into both nostrils 2 (two) times daily.   IRON POLYSACCHARIDES (NIFEREX) 150 MG CAPSULE    Take 150 mg by mouth daily.   LEVOTHYROXINE (LEVOTHROID) 25 MCG TABLET    Take 1 tablet (25 mcg total) by mouth  daily before breakfast.   LORAZEPAM (ATIVAN) 0.5 MG TABLET    Take 0.5 mg by mouth 2 (two) times daily.   MAGNESIUM HYDROXIDE (MILK OF MAGNESIA) 400 MG/5ML SUSPENSION    Take 30 mLs by mouth daily as needed for mild constipation.   METOPROLOL TARTRATE (LOPRESSOR) 25 MG TABLET    Take 12.5 mg by mouth daily. Hold if systolic <110 or HR <60   MIRTAZAPINE (REMERON) 7.5 MG TABLET    Take 7.5 mg by mouth at bedtime.   MULTIPLE VITAMINS-MINERALS (DECUBI-VITE) CAPS  Take 1 capsule by mouth daily.   POLYETHYLENE GLYCOL (MIRALAX / GLYCOLAX) PACKET    Take 17 g by mouth daily.   POTASSIUM CHLORIDE SA (K-DUR,KLOR-CON) 20 MEQ TABLET    Take 2 tablets (40 mEq total) by mouth daily.   QUETIAPINE (SEROQUEL) 25 MG TABLET    Take 25 mg by mouth at bedtime.   SENNA (SENOKOT) 8.6 MG TABLET    Take 2 tablets by mouth daily.   TAMSULOSIN HCL (FLOMAX) 0.4 MG CAPS    Take 1 capsule (0.4 mg total) by mouth daily after supper.   TORSEMIDE (DEMADEX) 10 MG TABLET    Take 10 mg by mouth daily.   VITAMIN D, CHOLECALCIFEROL, 1000 UNITS CAPS    Take 1 tablet by mouth daily.  Modified Medications   No medications on file  Discontinued Medications   ASPIRIN EC 81 MG TABLET    Take 1 tablet (81 mg total) by mouth daily.   CETIRIZINE HCL (ZYRTEC PO)    Take 5 mg by mouth daily.   DIPHENHYDRAMINE (BENADRYL) 25 MG TABLET    Take 25 mg by mouth as needed for itching.    LORAZEPAM (ATIVAN) 0.5 MG TABLET    Take one tablet by mouth every 8 hours as needed for anxiety    Review of Systems  Unable to perform ROS: Dementia    Vitals:   05/28/17 1536  BP: (!) 144/86  Pulse: 78  Resp: 16  Temp: 98.3 F (36.8 C)  TempSrc: Oral  SpO2: 95%  Weight: 129 lb 3.2 oz (58.6 kg)  Height: 5\' 4"  (1.626 m)   Body mass index is 22.18 kg/m.  Physical Exam  Constitutional: He appears well-developed.  Lying in bed in NAD, frail appearing, thin  HENT:  Mouth/Throat: Oropharynx is clear and moist.  MMM; no oral thrush  Eyes:  Pupils are equal, round, and reactive to light. No scleral icterus.  Neck: Neck supple. Carotid bruit is not present. No thyromegaly present.  Cardiovascular: Normal rate, regular rhythm and intact distal pulses.  Exam reveals no gallop and no friction rub.   Murmur (1/6 SEM) heard. no distal LE swelling. No calf TTP  Pulmonary/Chest: Effort normal and breath sounds normal. He has no wheezes. He has no rales. He exhibits no tenderness.  ICD palpable in left ACW  Abdominal: Soft. Bowel sounds are normal. He exhibits no distension, no abdominal bruit, no pulsatile midline mass and no mass. There is no tenderness. There is no rebound and no guarding.  Musculoskeletal: He exhibits edema.  Lymphadenopathy:    He has no cervical adenopathy.  Neurological: He is alert.  Skin: Skin is warm and dry. No rash noted.     Psychiatric: He has a normal mood and affect. His behavior is normal.     Labs reviewed: Nursing Home on 05/28/2017  Component Date Value Ref Range Status  . Hemoglobin 04/11/2017 11.0* 13.5 - 17.5 g/dL Final  . HCT 09/21/5746 34* 41 - 53 % Final  . Platelets 04/11/2017 168  150 - 399 K/L Final  . WBC 04/11/2017 4.1  10^3/mL Final  . Glucose 04/11/2017 75  mg/dL Final  . BUN 34/02/7095 16  4 - 21 mg/dL Final  . Creatinine 43/83/8184 1.0  0.6 - 1.3 mg/dL Final  . Potassium 03/75/4360 3.9  3.4 - 5.3 mmol/L Final  . Sodium 04/11/2017 143  137 - 147 mmol/L Final  Abstract on 03/11/2017  Component Date Value Ref Range Status  . Hemoglobin 12/26/2016  12.5* 13.5 - 17.5 g/dL Final  . HCT 45/40/9811 38* 41 - 53 % Final  . Neutrophils Absolute 12/26/2016 2  /L Final  . Platelets 12/26/2016 202  150 - 399 K/L Final  . WBC 12/26/2016 4.0  10^3/mL Final  . Glucose 12/26/2016 103  mg/dL Final  . BUN 91/47/8295 28* 4 - 21 mg/dL Final  . Creatinine 62/13/0865 1.2  0.6 - 1.3 mg/dL Final  . Potassium 78/46/9629 4.3  3.4 - 5.3 mmol/L Final  . Sodium 12/26/2016 138  137 - 147 mmol/L  Final  . Alkaline Phosphatase 12/26/2016 139* 25 - 125 U/L Final  . ALT 12/26/2016 5* 10 - 40 U/L Final   <  . AST 12/26/2016 9* 14 - 40 U/L Final  . Bilirubin, Total 12/26/2016 0.3  mg/dL Final  . TSH 52/84/1324 4.63  0.41 - 5.90 uIU/mL Final    No results found.   Assessment/Plan   ICD-10-CM   1. Bed sore on ankle, left, unstageable (HCC) L89.520   2. Failure to thrive in adult R62.7   3. Early onset Alzheimer's disease with behavioral disturbance G30.0    F02.81   4. Chronic atrial fibrillation (HCC) I48.2   5. Hypothyroidism due to acquired atrophy of thyroid E03.4   6. Essential hypertension, benign I10   7. Chronic combined systolic and diastolic congestive heart failure (HCC) I50.42   8. Biventricular ICD (implantable cardioverter-defibrillator) in place Z95.810      Check albumin and prealbumin level  Cont wound care as ordered per wound care provider  Cont other meds as ordered  Nutritional supplements as ordered  [facility] NP to follow  Will follow  Cecil Bixby S. Ancil Linsey  [facility] [address] [place], [place] [address] [phone] Cell (Monday-Friday 8 AM - 5 PM) [phone] After 5 PM and follow prompts

## 2017-05-30 ENCOUNTER — Non-Acute Institutional Stay (SKILLED_NURSING_FACILITY): Payer: Medicare Other

## 2017-05-30 DIAGNOSIS — Z0000 Encounter for general adult medical examination without abnormal findings: Secondary | ICD-10-CM | POA: Diagnosis not present

## 2017-05-30 NOTE — Patient Instructions (Addendum)
[name] , Thank you for taking time to come for your Medicare Wellness Visit. I appreciate your ongoing commitment to your health goals. Please review the following plan we discussed and let me know if I can assist you in the future.   Screening recommendations/referrals: Colonoscopy pt over age 81 Recommended yearly ophthalmology/optometry visit for glaucoma screening and checkup Recommended yearly dental visit for hygiene and checkup  Vaccinations: Influenza vaccine up to date. Due 10/25/2017 Pneumococcal vaccine up to date Tdap vaccine due 01/24/25 Shingles vaccine not in records  Advanced directives: DNR in chart  Conditions/risks identified: None  Next appointment: None upcoming  Preventive Care 65 Years and Older, Male Preventive care refers to lifestyle choices and visits with your health care provider that can promote health and wellness. What does preventive care include?  A yearly physical exam. This is also called an annual well check.  Dental exams once or twice a year.  Routine eye exams. Ask your health care provider how often you should have your eyes checked.  Personal lifestyle choices, including:  Daily care of your teeth and gums.  Regular physical activity.  Eating a healthy diet.  Avoiding tobacco and drug use.  Limiting alcohol use.  Practicing safe sex.  Taking low doses of aspirin every day.  Taking vitamin and mineral supplements as recommended by your health care provider. What happens during an annual well check? The services and screenings done by your health care provider during your annual well check will depend on your age, overall health, lifestyle risk factors, and family history of disease. Counseling  Your health care provider may ask you questions about your:  Alcohol use.  Tobacco use.  Drug use.  Emotional well-being.  Home and relationship well-being.  Sexual activity.  Eating habits.  History of falls.  Memory  and ability to understand (cognition).  Work and work Astronomer. Screening  You may have the following tests or measurements:  Height, weight, and BMI.  Blood pressure.  Lipid and cholesterol levels. These may be checked every 5 years, or more frequently if you are over 61 years old.  Skin check.  Lung cancer screening. You may have this screening every year starting at age 98 if you have a 30-pack-year history of smoking and currently smoke or have quit within the past 15 years.  Fecal occult blood test (FOBT) of the stool. You may have this test every year starting at age 64.  Flexible sigmoidoscopy or colonoscopy. You may have a sigmoidoscopy every 5 years or a colonoscopy every 10 years starting at age 40.  Prostate cancer screening. Recommendations will vary depending on your family history and other risks.  Hepatitis C blood test.  Hepatitis B blood test.  Sexually transmitted disease (STD) testing.  Diabetes screening. This is done by checking your blood sugar (glucose) after you have not eaten for a while (fasting). You may have this done every 1-3 years.  Abdominal aortic aneurysm (AAA) screening. You may need this if you are a current or former smoker.  Osteoporosis. You may be screened starting at age 63 if you are at high risk. Talk with your health care provider about your test results, treatment options, and if necessary, the need for more tests. Vaccines  Your health care provider may recommend certain vaccines, such as:  Influenza vaccine. This is recommended every year.  Tetanus, diphtheria, and acellular pertussis (Tdap, Td) vaccine. You may need a Td booster every 10 years.  Zoster vaccine. You may  need this after age 71.  Pneumococcal 13-valent conjugate (PCV13) vaccine. One dose is recommended after age 69.  Pneumococcal polysaccharide (PPSV23) vaccine. One dose is recommended after age 81. Talk to your health care provider about which screenings  and vaccines you need and how often you need them. This information is not intended to replace advice given to you by your health care provider. Make sure you discuss any questions you have with your health care provider. Document Released: 01/06/2016 Document Revised: 08/29/2016 Document Reviewed: 10/11/2015 Elsevier Interactive Patient Education  2017 [place] Prevention in the Home Falls can cause injuries. They can happen to people of all ages. There are many things you can do to make your home safe and to help prevent falls. What can I do on the outside of my home?  Regularly fix the edges of walkways and driveways and fix any cracks.  Remove anything that might make you trip as you walk through a door, such as a raised step or threshold.  Trim any bushes or trees on the path to your home.  Use bright outdoor lighting.  Clear any walking paths of anything that might make someone trip, such as rocks or tools.  Regularly check to see if handrails are loose or broken. Make sure that both sides of any steps have handrails.  Any raised decks and porches should have guardrails on the edges.  Have any leaves, snow, or ice cleared regularly.  Use sand or salt on walking paths during winter.  Clean up any spills in your garage right away. This includes oil or grease spills. What can I do in the bathroom?  Use night lights.  Install grab bars by the toilet and in the tub and shower. Do not use towel bars as grab bars.  Use non-skid mats or decals in the tub or shower.  If you need to sit down in the shower, use a plastic, non-slip stool.  Keep the floor dry. Clean up any water that spills on the floor as soon as it happens.  Remove soap buildup in the tub or shower regularly.  Attach bath mats securely with double-sided non-slip rug tape.  Do not have throw rugs and other things on the floor that can make you trip. What can I do in the bedroom?  Use night  lights.  Make sure that you have a light by your bed that is easy to reach.  Do not use any sheets or blankets that are too big for your bed. They should not hang down onto the floor.  Have a firm chair that has side arms. You can use this for support while you get dressed.  Do not have throw rugs and other things on the floor that can make you trip. What can I do in the kitchen?  Clean up any spills right away.  Avoid walking on wet floors.  Keep items that you use a lot in easy-to-reach places.  If you need to reach something above you, use a strong step stool that has a grab bar.  Keep electrical cords out of the way.  Do not use floor polish or wax that makes floors slippery. If you must use wax, use non-skid floor wax.  Do not have throw rugs and other things on the floor that can make you trip. What can I do with my stairs?  Do not leave any items on the stairs.  Make sure that there are handrails on both sides  of the stairs and use them. Fix handrails that are broken or loose. Make sure that handrails are as long as the stairways.  Check any carpeting to make sure that it is firmly attached to the stairs. Fix any carpet that is loose or worn.  Avoid having throw rugs at the top or bottom of the stairs. If you do have throw rugs, attach them to the floor with carpet tape.  Make sure that you have a light switch at the top of the stairs and the bottom of the stairs. If you do not have them, ask someone to add them for you. What else can I do to help prevent falls?  Wear shoes that:  Do not have high heels.  Have rubber bottoms.  Are comfortable and fit you well.  Are closed at the toe. Do not wear sandals.  If you use a stepladder:  Make sure that it is fully opened. Do not climb a closed stepladder.  Make sure that both sides of the stepladder are locked into place.  Ask someone to hold it for you, if possible.  Clearly mark and make sure that you can  see:  Any grab bars or handrails.  First and last steps.  Where the edge of each step is.  Use tools that help you move around (mobility aids) if they are needed. These include:  Canes.  Walkers.  Scooters.  Crutches.  Turn on the lights when you go into a dark area. Replace any light bulbs as soon as they burn out.  Set up your furniture so you have a clear path. Avoid moving your furniture around.  If any of your floors are uneven, fix them.  If there are any pets around you, be aware of where they are.  Review your medicines with your doctor. Some medicines can make you feel dizzy. This can increase your chance of falling. Ask your doctor what other things that you can do to help prevent falls. This information is not intended to replace advice given to you by your health care provider. Make sure you discuss any questions you have with your health care provider. Document Released: 10/06/2009 Document Revised: 05/17/2016 Document Reviewed: 01/14/2015 Elsevier Interactive Patient Education  2017 [employer].

## 2017-05-30 NOTE — Progress Notes (Signed)
Subjective:   Kevin [name] is a 83 y.o. male who presents for an Initial Medicare Annual Wellness Visit. Patient residing in [facility] skilled nursing facility - long term; incapacitated and unable to answer questions appropriately     Objective:    Today's Vitals   05/30/17 1044  BP: (!) 110/54  Pulse: 72  Temp: 97.7 F (36.5 C)  TempSrc: Oral  SpO2: 98%  Weight: 129 lb (58.5 kg)  Height: 5\' 4"  (1.626 m)   Body mass index is 22.14 kg/m.  Current Medications (verified) Outpatient Encounter Prescriptions as of 05/30/2017  Medication Sig  . acetaminophen (TYLENOL) 325 MG tablet Take 650 mg by mouth every 4 (four) hours as needed.  . Amino Acids-Protein Hydrolys (FEEDING SUPPLEMENT, PRO-STAT SUGAR FREE 64,) LIQD Take 30 mLs by mouth 2 (two) times daily.  Marland Kitchen amiodarone (PACERONE) 200 MG tablet Take 1 tablet (200 mg total) by mouth daily. Start amiodarone 200mg  daily on 12/24/14  . aspirin 325 MG EC tablet Take 325 mg by mouth daily.  . calcium carbonate (TUMS - DOSED IN MG ELEMENTAL CALCIUM) 500 MG chewable tablet Chew 1 tablet by mouth daily.  . carboxymethylcellulose (REFRESH TEARS) 0.5 % SOLN Place 1 drop into both eyes 3 (three) times daily.   . cholecalciferol (VITAMIN D) 400 units TABS tablet Take 400 Units by mouth daily.  . clopidogrel (PLAVIX) 75 MG tablet Take 75 mg by mouth daily.  . digoxin (LANOXIN) 0.125 MG tablet Take 1 tablet (0.125 mg total) by mouth daily.  . fluticasone (FLONASE) 50 MCG/ACT nasal spray Place 2 sprays into both nostrils 2 (two) times daily.  . iron polysaccharides (NIFEREX) 150 MG capsule Take 150 mg by mouth daily.  Marland Kitchen levothyroxine (LEVOTHROID) 25 MCG tablet Take 1 tablet (25 mcg total) by mouth daily before breakfast.  . LORazepam (ATIVAN) 0.5 MG tablet Take 0.5 mg by mouth 2 (two) times daily.  . magnesium hydroxide (MILK OF MAGNESIA) 400 MG/5ML suspension Take 30 mLs by mouth daily as needed for mild constipation.  . metoprolol tartrate  (LOPRESSOR) 25 MG tablet Take 12.5 mg by mouth daily. Hold if systolic <110 or HR <60  . mirtazapine (REMERON) 7.5 MG tablet Take 7.5 mg by mouth at bedtime.  . Multiple Vitamins-Minerals (DECUBI-VITE) CAPS Take 1 capsule by mouth daily.  . polyethylene glycol (MIRALAX / GLYCOLAX) packet Take 17 g by mouth daily.  . potassium chloride SA (K-DUR,KLOR-CON) 20 MEQ tablet Take 2 tablets (40 mEq total) by mouth daily.  . QUEtiapine (SEROQUEL) 25 MG tablet Take 25 mg by mouth at bedtime.  . senna (SENOKOT) 8.6 MG tablet Take 2 tablets by mouth daily.  . Tamsulosin HCl (FLOMAX) 0.4 MG CAPS Take 1 capsule (0.4 mg total) by mouth daily after supper.  . torsemide (DEMADEX) 10 MG tablet Take 10 mg by mouth daily.  . Vitamin D, Cholecalciferol, 1000 units CAPS Take 1 tablet by mouth daily.   No facility-administered encounter medications on file as of 05/30/2017.     Allergies (verified) Morphine and Wellbutrin [bupropion]   History: Past Medical History:  Diagnosis Date  . AICD (automatic cardioverter/defibrillator) present   . Dementia   . Depressive disorder   . Hypertension   . Insomnia   . LBBB (left bundle branch block)   . Nonischemic cardiomyopathy (HCC)   . Obesity   . Sleep apnea    no cpap  . UTI (lower urinary tract infection)   . Vitamin D deficiency  Past Surgical History:  Procedure Laterality Date  . BACK SURGERY    . CARDIAC DEFIBRILLATOR PLACEMENT     [facility]  . ESOPHAGOGASTRODUODENOSCOPY  06/28/2012   Procedure: ESOPHAGOGASTRODUODENOSCOPY (EGD);  Surgeon: Petra Kuba, MD;  Location: [facility] ENDOSCOPY;  Service: Endoscopy;  Laterality: N/A;  . HERNIA REPAIR    . TOTAL KNEE ARTHROPLASTY     left   Family History  Problem Relation Age of Onset  . Stroke Mother   . Lung cancer Brother   . Brain cancer Brother    Social History   Occupational History  . Not on file.   Social History Main Topics  . Smoking status: Never Smoker  . Smokeless tobacco: Never Used  .  Alcohol use No  . Drug use: No  . Sexual activity: Not on file   Tobacco Counseling Counseling given: Not Answered   Activities of Daily Living In your present state of health, do you have any difficulty performing the following activities: 05/30/2017  Hearing? Y  Vision? N  Difficulty concentrating or making decisions? Y  Walking or climbing stairs? Y  Dressing or bathing? Y  Doing errands, shopping? Y  Preparing Food and eating ? Y  Using the Toilet? Y  In the past six months, have you accidently leaked urine? Y  Do you have problems with loss of bowel control? Y  Managing your Medications? Y  Managing your Finances? Y  Housekeeping or managing your Housekeeping? Y  Some recent data might be hidden    Immunizations and Health Maintenance Immunization History  Administered Date(s) Administered  . Influenza Split 10/22/2011, 09/26/2012, 10/14/2013  . Influenza-Unspecified 10/06/2014, 09/16/2015, 10/25/2016  . PPD Test 10/20/2010  . Pneumococcal Conjugate-13 08/24/2009, 04/10/2017  . Pneumococcal-Unspecified 10/12/2014   There are no preventive care reminders to display for this patient.  Patient Care Team: Kirt Boys, DO as PCP - [facility] ([facility]  Indicate any recent Medical Services you may have received from other than [facility] providers in the past year (date may be approximate).    Assessment:   This is a routine wellness examination for [employer].   Hearing/Vision screen No exam data present  Dietary issues and exercise activities discussed: Current Exercise Habits: The patient does not participate in regular exercise at present, Exercise limited by: orthopedic condition(s);neurologic condition(s)  Goals    None     Depression Screen PHQ 2/9 Scores 05/30/2017  PHQ - 2 Score 0    Fall Risk Fall Risk  05/30/2017  Falls in the past year? No    Cognitive Function: MMSE - Mini Mental State Exam  05/30/2017  Not completed: Unable to complete        Screening Tests Health Maintenance  Topic Date Due  . INFLUENZA VACCINE  07/24/2017  . TETANUS/TDAP  01/24/2025  . PNA vac Low Risk Adult  Completed        Plan:    I have personally reviewed and addressed the Medicare Annual Wellness questionnaire and have noted the following in the patient's chart:  A. Medical and social history B. Use of alcohol, tobacco or illicit drugs  C. Current medications and supplements D. Functional ability and status E.  Nutritional status F.  Physical activity G. Advance directives H. List of other physicians I.  Hospitalizations, surgeries, and ER visits in previous 12 months J.  Vitals K. Screenings to include hearing, vision, cognitive, depression L. Referrals and appointments - none  In addition,  I have reviewed and discussed with patient certain preventive protocols, quality metrics, and best practice recommendations. A written personalized care plan for preventive services as well as general preventive health recommendations were provided to patient.  See attached scanned questionnaire for additional information.   Signed,   Annetta Maw, RN Nurse Health Advisor   Quick Notes   Health Maintenance: up to date     Abnormal Screen: Dementia, can't complete cognitive exam     Patient Concerns: None     Nurse Concerns: None

## 2020-02-22 DEATH — deceased
